# Patient Record
Sex: Female | Born: 1955 | Race: White | Hispanic: No | Marital: Single | Smoking: Former smoker
Health system: Southern US, Community
[De-identification: ages and names within clinical notes are randomized; demographics above are authoritative.]

## PROBLEM LIST (undated history)

## (undated) DIAGNOSIS — I739 Peripheral vascular disease, unspecified: Secondary | ICD-10-CM

## (undated) DIAGNOSIS — R87619 Unspecified abnormal cytological findings in specimens from cervix uteri: Secondary | ICD-10-CM

## (undated) DIAGNOSIS — M1990 Unspecified osteoarthritis, unspecified site: Secondary | ICD-10-CM

## (undated) DIAGNOSIS — J449 Chronic obstructive pulmonary disease, unspecified: Secondary | ICD-10-CM

## (undated) DIAGNOSIS — I70229 Atherosclerosis of native arteries of extremities with rest pain, unspecified extremity: Secondary | ICD-10-CM

## (undated) DIAGNOSIS — I998 Other disorder of circulatory system: Secondary | ICD-10-CM

## (undated) DIAGNOSIS — R519 Headache, unspecified: Secondary | ICD-10-CM

## (undated) HISTORY — PX: COLONOSCOPY W/ POLYPECTOMY: SHX1380

## (undated) HISTORY — DX: Unspecified abnormal cytological findings in specimens from cervix uteri: R87.619

## (undated) HISTORY — PX: BREAST SURGERY: SHX581

## (undated) HISTORY — PX: BUNIONECTOMY: SHX129

## (undated) HISTORY — PX: OTHER SURGICAL HISTORY: SHX169

## (undated) HISTORY — PX: CARDIAC CATHETERIZATION: SHX172

## (undated) HISTORY — DX: Chronic obstructive pulmonary disease, unspecified: J44.9

## (undated) HISTORY — DX: Other disorder of circulatory system: I99.8

## (undated) HISTORY — DX: Atherosclerosis of native arteries of extremities with rest pain, unspecified extremity: I70.229

---

## 2009-09-17 ENCOUNTER — Other Ambulatory Visit: Admission: RE | Admit: 2009-09-17 | Discharge: 2009-09-17 | Payer: Self-pay | Admitting: Rheumatology

## 2010-01-07 DIAGNOSIS — J449 Chronic obstructive pulmonary disease, unspecified: Secondary | ICD-10-CM

## 2010-01-07 DIAGNOSIS — J4489 Other specified chronic obstructive pulmonary disease: Secondary | ICD-10-CM | POA: Insufficient documentation

## 2010-01-08 ENCOUNTER — Ambulatory Visit: Payer: Self-pay | Admitting: Internal Medicine

## 2010-01-08 DIAGNOSIS — F17200 Nicotine dependence, unspecified, uncomplicated: Secondary | ICD-10-CM | POA: Insufficient documentation

## 2010-01-09 ENCOUNTER — Telehealth (INDEPENDENT_AMBULATORY_CARE_PROVIDER_SITE_OTHER): Payer: Self-pay | Admitting: *Deleted

## 2010-02-05 ENCOUNTER — Telehealth (INDEPENDENT_AMBULATORY_CARE_PROVIDER_SITE_OTHER): Payer: Self-pay | Admitting: *Deleted

## 2010-05-05 NOTE — Progress Notes (Signed)
Summary: cxr results  Phone Note Call from Patient Call back at Home Phone [phone]   Caller: Patient Call For: WERT Summary of Call: PT RETURNED CALL FROM MINDY.  Initial call taken by: Tivis Ringer, CNA,  January 09, 2010 12:54 PM  Follow-up for Phone Call        called spoke with patient, advised of cxr results.  pt verbalized her understanding. Follow-up by: Boone Master CNA/MA,  January 09, 2010 1:31 PM

## 2010-05-05 NOTE — Assessment & Plan Note (Signed)
Summary: Pulmonary consultation/ copd exac/ still smoking   Visit Type:  Initial Consult Copy to:  Dr. Kathrynn Running Primary Provider/Referring Provider:  Dr. Kathrynn Running   CC:  COPD.  History of Present Illness: 55 yowf active smoker with recurrent pattern of cough/ congestion since 2000 dx as ab and on some form of maint rx with good control of daily symptoms until Sept 2011  January 08, 2010  1st pulmonary office eval  cc cough x worse since Sept 2011 and since then using duoneb every 4hours including at night rx with doxy/ pred then biaxin and now still on Prednisone 20 mg and now for the first time really not responding >  no purulent mucus.  generalized bilateral  cp with coughing and urinary incontinence but no overt heartburn.  Pt denies any significant sore throat, dysphagia, itching, sneezing,  nasal congestion or excess secretions,  fever, chills, sweats, unintended wt loss, pleuritic or exertional cp, hempoptysis, change in activity tolerance  orthopnea pnd or leg swelling Pt also denies any obvious fluctuation in symptoms with weather or environmental change or other alleviating or aggravating factors.       Preventive Screening-Counseling & Management  Alcohol-Tobacco     Smoking Status: current     Smoking Cessation Counseling: yes  Current Medications (verified): 1)  Symbicort 160-4.5 Mcg/act Aero (Budesonide-Formoterol Fumarate) .... 2 Puffs Two Times A Day 2)  Duoneb 0.5-2.5 (3) Mg/49ml Soln (Ipratropium-Albuterol) .Marland Kitchen.. 1 in Nebulizer Once Daily As Needed 3)  Proair Hfa 108 (90 Base) Mcg/act Aers (Albuterol Sulfate) .... 2 Puffs Every 4-6 Hours As Needed 4)  Prednisone 20 Mg Tabs (Prednisone) .... As Directed Per Dr Kathrynn Running  Allergies (verified): No Known Drug Allergies  Past History:  Past Medical History: COPD/ab, clinical dx only    - PFT's ordered January 08, 2010 >>>  Family History: Family History Emphysema - Mother Family History Lung Cancer- Father (was a  smoker) Heart dz- Mother Family History Colon Cancer- Sister neg atopy  Social History: Single Current smoker since age 55.  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  The patient denies productive cough, coughing up blood, chest pain, irregular heartbeats, acid heartburn, indigestion, loss of appetite, weight change, abdominal pain, difficulty swallowing, sore throat, tooth/dental problems, headaches, nasal congestion/difficulty breathing through nose, sneezing, itching, ear ache, anxiety, depression, hand/feet swelling, joint stiffness or pain, rash, change in color of mucus, and fever.    Vital Signs:  Patient profile:   55 year old female Height:      66 inches Weight:      185.38 pounds BMI:     30.03 O2 Sat:      97 % on Room air Temp:     98.3 degrees F oral Pulse rate:   78 / minute BP sitting:   138 / 78  (left arm)  Vitals Entered By: Vernie Murders (January 08, 2010 11:11 AM)  O2 Flow:  Room air  Physical Exam  Additional Exam:  amb pleasant wf with harsh barking upper airway cough wt 185 January 08, 2010 HEENT mild turbinate edema.  Oropharynx no thrush or excess pnd or cobblestoning.  No JVD or cervical adenopathy. Mild accessory muscle hypertrophy. Trachea midline, nl thryroid. Chest was hyperinflated by percussion with diminished breath sounds and moderate increased exp  time without wheeze. Hoover sign positive at mid inspiration. Regular rate and rhythm without murmur gallop or rub or increase P2 or edema.  Abd: no hsm, nl excursion. Ext warm without cyanosis or clubbing.     Impression & Recommendations:  Problem # 1:  COPD (ICD-496)   DDX of  difficult airways managment all start with A and  include Adherence, Ace Inhibitors, Acid Reflux, Active Sinus Disease, Alpha 1 Antitripsin deficiency,  Anxiety masquerading as Airways dz,  ABPA,  allergy(esp in young), Aspiration (esp in elderly), Adverse effects of DPI,  Active smokers, plus one B  = Beta blocker use.Marland Kitchen    Active smoking likely the main culprit here  ? adherence?  both of her hfa's were empty today I spent extra time with the patient today explaining optimal mdi  technique.  This improved from  50-75%  Medications Added to Medication List This Visit: 1)  Symbicort 160-4.5 Mcg/act Aero (Budesonide-formoterol fumarate) .... 2 puffs two times a day 2)  Symbicort 160-4.5 Mcg/act Aero (Budesonide-formoterol fumarate) .... 2 puffs first thing  in am and 2 puffs again in pm about 12 hours later 3)  Duoneb 0.5-2.5 (3) Mg/57ml Soln (Ipratropium-albuterol) .Marland Kitchen.. 1 neb every 4 hours if needed for breathlessness 4)  Duoneb 0.5-2.5 (3) Mg/60ml Soln (Ipratropium-albuterol) .Marland Kitchen.. 1 in nebulizer once daily as needed 5)  Proair Hfa 108 (90 Base) Mcg/act Aers (Albuterol sulfate) .... 2 puffs every 4-6 hours as needed 6)  Prednisone 20 Mg Tabs (Prednisone) .... As directed per dr Kathrynn Running 7)  Xopenex Hfa 45 Mcg/act Aero (Levalbuterol tartrate) .... 2 puffs every 4 hours if needed for short of breath 8)  Mucinex Dm 30-600 Mg Xr12h-tab (Dextromethorphan-guaifenesin) .Marland Kitchen.. 1-2 every 12 hours if  needed for cough or congestion 9)  Tramadol Hcl 50 Mg Tabs (Tramadol hcl) .... One to two by mouth every 4-6 hours if coughing 10)  Prednisone 10 Mg Tabs (Prednisone) .... 4 each am x 2 days,  3 x 2days, 2x2 days, and 1x2 days  Other Orders: T-2 View CXR ([id number])  Patient Instructions: 1)  continue symbicort 160 2 puffs first thing  in am and 2 puffs again in pm about 12 hours later 2)  if still short of breath Xopenex hfa and back this up with duoneb every 4 hours if needed 3)  if cough, mucinex dm 2 every 12 hours and back this up with tramadol 50 mg 1-2 every 4 hours 4)  Prednisone 10 mg 4 each am x 2 days,  3 x 2days, 2x2 days, and 1x2 days  5)   Prilosec 20 mg Take  one 30-60 min before first meal of the day and pepcid 20 mg one at bedtime when coughing 6)  GERD (REFLUX)  is a common cause of respiratory symptoms. It commonly presents without heartburn and can be treated with medication, but also with lifestyle changes including avoidance of late meals, excessive alcohol, smoking cessation, and avoid fatty foods, chocolate, peppermint, colas, red wine, and acidic juices such as orange juice. NO MINT OR MENTHOL PRODUCTS SO NO COUGH DROPS  7)  USE SUGARLESS CANDY INSTEAD (jolley ranchers)  8)  NO OIL BASED VITAMINS  9)  Please schedule a follow-up appointment in 4 weeks, sooner if needed with PFT's on return Prescriptions: TRAMADOL HCL 50 MG  TABS (TRAMADOL HCL) One to two by mouth every 4-6 hours if coughing  #40 x 0   Entered and Authorized by:   Nyoka Cowden MD  Signed by:   Nyoka Cowden MD on 01/08/2010   Method used:   Electronically to        [facility] (retail)       [address]       [place], [place]  [address]       Ph: [phone]       Fax: [phone]   RxID:   [id number] PREDNISONE 10 MG  TABS (PREDNISONE) 4 each am x 2 days,  3 x 2days, 2x2 days, and 1x2 days  #20 x 0   Entered and Authorized by:   Nyoka Cowden MD   Signed by:   Nyoka Cowden MD on 01/08/2010   Method used:   Electronically to        [facility] (retail)       [address]       [place], [place]  [address]       Ph: [phone]       Fax: [phone]   RxID:   [id number]   Appended Document: Pulmonary consultation/ copd exac/ still smoking     Copy to:  Dr. Kathrynn Running Primary Provider/Referring Provider:  Dr. Kathrynn Running    History of Present Illness: 75 yowf active smoker with recurrent pattern of cough/ congestion since 2000 dx as ab and on some form of maint rx with good control of daily symptoms until Sept 2011  January 08, 2010  1st pulmonary office eval   cc cough x worse since Sept 2011 and since then using duoneb every 4hours including at night rx with doxy/ pred then biaxin and now still on Prednisone 20 mg and now for the first time really not responding >  no purulent mucus.  generalized bilateral  cp with coughing and urinary incontinence but no overt heartburn.  Pt denies any significant sore throat, dysphagia, itching, sneezing,  nasal congestion or excess secretions,  fever, chills, sweats, unintended wt loss, pleuritic or exertional cp, hempoptysis, change in activity tolerance  orthopnea pnd or leg swelling Pt also denies any obvious fluctuation in symptoms with weather or environmental change or other alleviating or aggravating factors.       Preventive Screening-Counseling & Management  Alcohol-Tobacco     Smoking Status: current     Smoking Cessation Counseling: yes  Allergies: No Known Drug Allergies  Past History:  Past Medical History: COPD/ab, clinical dx only    - HFA 75% p coaching January 08, 2010     - PFT's ordered January 08, 2010 >>>  Family History: Reviewed history from 01/08/2010 and no changes required. Family History Emphysema - Mother Family History Lung Cancer- Father (was a smoker) Heart dz- Mother Family History Colon Cancer- Sister neg atopy  Social History: Reviewed history from 01/08/2010 and no changes required. Single Current smoker since age 39.  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  Oropharynx no thrush or excess pnd or cobblestoning.  No JVD or cervical adenopathy. Mild accessory muscle hypertrophy. Trachea midline, nl thryroid. Chest was hyperinflated by percussion with diminished breath sounds and moderate increased exp time without wheeze. Hoover sign positive at mid inspiration. Regular rate and rhythm without murmur gallop or rub or increase  P2  or edema.  Abd: no hsm, nl excursion. Ext warm without cyanosis or clubbing.     CXR  Procedure date:  01/08/2010  Findings:        Comparison: None.   Findings: The lungs are clear.  There is no pneumothorax or pleural effusion.  Heart size is normal.   IMPRESSION: No acute disease.  Impression & Recommendations:  Problem # 1:  COPD (ICD-496)    DDX of  difficult airways managment all start with A and  include Adherence, Ace Inhibitors, Acid Reflux, Active Sinus Disease, Alpha 1 Antitripsin deficiency, Anxiety masquerading as Airways dz,  ABPA,  allergy(esp in young), Aspiration (esp in elderly), Adverse effects of DPI,  Active smokers, plus one B  = Beta blocker use.Marland Kitchen    Active smoking likely the main culprit here  ? adherence?  both of her hfa's were empty today I spent extra time with the patient today explaining optimal mdi  technique.  This improved from  50-75%  ? acid reflux:  probably an issue given severity of cough.  See instructions for specific recommendations   ? active sinus dz > sinus ct next step  Problem # 2:  SMOKER (ICD-305.1)   I emphasized that although we never turn away smokers from the pulmonary clinic, we do ask that they understand that the recommendations that were made won't work nearly as well in the presence of continued cigarette exposure and we may reach a point where we can't help the patient if he/she can't quit smoking.    I took this opportunity to educate the patient regarding the consequences of smoking in airway disorders based on all the data we have from the multiple national lung health studies indicating that smoking cessation, not choice of inhalers or physicians, is the most important aspect of care.    Orders: Consultation Level V [phone])  Patient Instructions: 1)  continue symbicort 160 2 puffs first thing  in am and 2 puffs again in pm about 12 hours later 2)  if still short of breath Xopenex hfa and back this up with  duoneb every 4 hours if needed 3)  if cough, mucinex dm 2 every 12 hours and back this up with tramadol 50 mg 1-2 every 4 hours 4)  Prednisone 10 mg 4 each am x 2 days,  3 x 2days, 2x2 days, and 1x2 days  5)  Prilosec 20 mg Take  one 30-60 min before first meal of the day and pepcid 20 mg one at bedtime when coughing 6)  GERD (REFLUX)  is a common cause of respiratory symptoms. It commonly presents without heartburn and can be treated with medication, but also with lifestyle changes including avoidance of late meals, excessive alcohol, smoking cessation, and avoid fatty foods, chocolate, peppermint, colas, red wine, and acidic juices such as orange juice. NO MINT OR MENTHOL PRODUCTS SO NO COUGH DROPS  7)  USE SUGARLESS CANDY INSTEAD (jolley ranchers)  8)  NO OIL BASED VITAMINS  9)  Please schedule a follow-up appointment in 4 weeks, sooner if needed with PFT's on return

## 2010-05-05 NOTE — Progress Notes (Signed)
Summary: refill - returning call  Phone Note Call from Patient Call back at Home Phone [phone]   Caller: Patient Call For: wert Summary of Call: pt needs refill of xopenex inhaler called to [facility] had a sample before Initial call taken by: Lacinda Axon,  February 05, 2010 3:25 PM  Follow-up for Phone Call        [facility]CB x1 to verify med requested and get pharmacy. Boone Master CNA/MA  February 05, 2010 3:34 PM    Returning call. Lehman Prom  February 05, 2010 4:24 PM  Additional Follow-up for Phone Call Additional follow up Details #1::        Spoke with pt and verified msg, rx for xopenex was sent to [facility]. Vernie Murders  February 05, 2010 4:33 PM     Prescriptions: XOPENEX HFA 45 MCG/ACT AERO (LEVALBUTEROL TARTRATE) 2 puffs every 4 hours if needed for short of breath  #1 x 3   Entered by:   Vernie Murders   Authorized by:   Nyoka Cowden MD   Signed by:   Vernie Murders on 02/05/2010   Method used:   Electronically to        [facility] (retail)       [address]       [place], [place]  [address]       Ph: [phone]       Fax: [phone]   RxID:   [id number]

## 2010-11-23 ENCOUNTER — Ambulatory Visit (HOSPITAL_BASED_OUTPATIENT_CLINIC_OR_DEPARTMENT_OTHER)
Admission: RE | Admit: 2010-11-23 | Discharge: 2010-11-23 | Disposition: A | Discharge status: Home / Self Care | Payer: Managed Care, Other (non HMO) | Source: Ambulatory Visit | Attending: Family Medicine | Admitting: Family Medicine

## 2010-11-23 ENCOUNTER — Encounter: Payer: Self-pay | Admitting: Family Medicine

## 2010-11-23 ENCOUNTER — Ambulatory Visit (INDEPENDENT_AMBULATORY_CARE_PROVIDER_SITE_OTHER): Payer: Self-pay | Admitting: Family Medicine

## 2010-11-23 VITALS — BP 142/82 | HR 80 | Temp 98.0°F | Ht 66.0 in | Wt 186.0 lb

## 2010-11-23 DIAGNOSIS — M949 Disorder of cartilage, unspecified: Secondary | ICD-10-CM | POA: Insufficient documentation

## 2010-11-23 DIAGNOSIS — M899 Disorder of bone, unspecified: Secondary | ICD-10-CM | POA: Insufficient documentation

## 2010-11-23 DIAGNOSIS — M25569 Pain in unspecified knee: Secondary | ICD-10-CM

## 2010-11-23 DIAGNOSIS — M25561 Pain in right knee: Secondary | ICD-10-CM | POA: Insufficient documentation

## 2010-11-23 DIAGNOSIS — M898X9 Other specified disorders of bone, unspecified site: Secondary | ICD-10-CM

## 2010-11-23 NOTE — Patient Instructions (Signed)
Your symptoms and exam are consistent with mild arthritis versus a degenerative cartilage tear in your knee. These are both treated the same initially. You were given a cortisone shot to calm down the inflammation, pain, and swelling. Aggressive quad strengthening to help unload the joint (straight leg raises, quad sets - 3 sets of 10 once a day) - if too easy, add an ankle weight. Consider formal physical therapy. Icing 15 minutes at a time 3-4 times a day. ACE wrap and elevation if swelling becomes more severe. Knee sleeve would help with swelling as well. Try the orthotics you just bought and I would recommend you buy shoes with a wide forefoot. Follow up with me in 1 month if the orthotics you bought are not helping.

## 2010-11-23 NOTE — Progress Notes (Signed)
  Subjective:    Patient ID: Angela Alvarez, female    DOB: 03/08/1956, 55 y.o.   MRN: [id number]  PCP: Dr. Juline Patch  HPI 55 yo F here with right knee pain  Patient denies known injury. States for past 11 months has had intermittent lateral > medial knee pain. Associated with some swelling though not swollen now. No bruising. No catching, locking, giving out. Takes advil occasionally. Not icing or using heat. Pain worse when on feet for long periods of time. Has not been evaluated for this before or had x-rays.  She's also had problems with bilateral bunions, metatarsalgia - just ordered some temporary air orthotics that she's going to try - discussed wide forefoot shoes as well.  Past Medical History  Diagnosis Date  . Asthma     No current outpatient prescriptions on file prior to visit.    Past Surgical History  Procedure Date  . Bunionectomy     bilateral    No Known Allergies  History   Social History  . Marital Status: Single    Spouse Name: N/A    Number of Children: N/A  . Years of Education: N/A   Occupational History  . Not on file.   Social History Main Topics  . Smoking status: Current Everyday Smoker -- 1.0 packs/day    Types: Cigarettes  . Smokeless tobacco: Not on file  . Alcohol Use: Not on file  . Drug Use: Not on file  . Sexually Active: Not on file   Other Topics Concern  . Not on file   Social History Narrative  . No narrative on file    Family History  Problem Relation Age of Onset  . Diabetes Neg Hx   . Heart attack Neg Hx   . Hyperlipidemia Neg Hx   . Hypertension Neg Hx   . Sudden death Neg Hx     BP 142/82  Pulse 80  Temp(Src) 98 F (36.7 C) (Oral)  Ht 5\' 6"  (1.676 m)  Wt 186 lb (84.369 kg)  BMI 30.02 kg/m2  Review of Systems See HPI above.    Objective:   Physical Exam Gen: NAD R knee: No gross deformity, ecchymoses, swelling.  No TTP joint lines, pes, post patellar facets, elsewhere about knee  (states not having any pain currently) FROM. Negative ant/post drawers. Negative valgus/varus testing. Negative lachmanns. Negative mcmurrays, apleys, patellar apprehension, clarkes. NV intact distally.    Assessment & Plan:  1. Right knee pain - radiographs reviewed and only mild DJD but more medial.  History is most consistent with pain from mild DJD vs degenerative meniscal tear, both treated similarly.  Offered injection vs oral nsaid and she would like to proceed with injection.  Shown quad strengthening exercises (she prefers this over formal PT).  Icing prn.  See instructions for further.  After informed written consent, patient was seated on exam table. Right knee was prepped with alcohol swab and utilizing anteromedial approach, patient's right knee was injected intraarticularly with 3:1 marcaine: depomedrol. Patient tolerated the procedure well without immediate complications.

## 2010-11-23 NOTE — Assessment & Plan Note (Signed)
radiographs reviewed and only mild DJD but more medial.  History is most consistent with pain from mild DJD vs degenerative meniscal tear, both treated similarly.  Offered injection vs oral nsaid and she would like to proceed with injection.  Shown quad strengthening exercises (she prefers this over formal PT).  Icing prn.  See instructions for further.  After informed written consent, patient was seated on exam table. Right knee was prepped with alcohol swab and utilizing anteromedial approach, patient's right knee was injected intraarticularly with 3:1 marcaine: depomedrol. Patient tolerated the procedure well without immediate complications.

## 2010-12-04 ENCOUNTER — Ambulatory Visit (INDEPENDENT_AMBULATORY_CARE_PROVIDER_SITE_OTHER): Payer: Managed Care, Other (non HMO) | Admitting: Family Medicine

## 2010-12-04 ENCOUNTER — Encounter: Payer: Self-pay | Admitting: Family Medicine

## 2010-12-04 DIAGNOSIS — M7740 Metatarsalgia, unspecified foot: Secondary | ICD-10-CM | POA: Insufficient documentation

## 2010-12-04 DIAGNOSIS — M79609 Pain in unspecified limb: Secondary | ICD-10-CM

## 2010-12-04 DIAGNOSIS — M79673 Pain in unspecified foot: Secondary | ICD-10-CM

## 2010-12-04 DIAGNOSIS — M7750 Other enthesopathy of unspecified foot: Secondary | ICD-10-CM

## 2010-12-04 NOTE — Progress Notes (Signed)
  Subjective:    Patient ID: Angela Alvarez, female    DOB: Nov 12, 1955, 55 y.o.   MRN: [id number]  HPI  Patient returned today for sports insoles - she tried the air orthotics she purchased and these were not helping her.  She was advised she could just come in and get these but she made an appointment.  See previous OV note for details.  Past Medical History  Diagnosis Date  . Asthma     Current Outpatient Prescriptions on File Prior to Visit  Medication Sig Dispense Refill  . albuterol (PROVENTIL,VENTOLIN) 90 MCG/ACT inhaler Inhale 2 puffs into the lungs every 4 (four) hours as needed.        . budesonide-formoterol (SYMBICORT) 160-4.5 MCG/ACT inhaler Inhale 2 puffs into the lungs 2 (two) times daily.          Past Surgical History  Procedure Date  . Bunionectomy     bilateral    No Known Allergies  History   Social History  . Marital Status: Single    Spouse Name: N/A    Number of Children: N/A  . Years of Education: N/A   Occupational History  . Not on file.   Social History Main Topics  . Smoking status: Current Everyday Smoker -- 1.0 packs/day    Types: Cigarettes  . Smokeless tobacco: Not on file  . Alcohol Use: Not on file  . Drug Use: Not on file  . Sexually Active: Not on file   Other Topics Concern  . Not on file   Social History Narrative  . No narrative on file    Family History  Problem Relation Age of Onset  . Diabetes Neg Hx   . Heart attack Neg Hx   . Hyperlipidemia Neg Hx   . Hypertension Neg Hx   . Sudden death Neg Hx     BP 147/92  Pulse 74  Temp(Src) 97.6 F (36.4 C) (Oral)  Ht 5\' 6"  (1.676 m)  Wt 182 lb (82.555 kg)  BMI 29.38 kg/m2  Review of Systems     Objective:   Physical Exam See last OV.    Assessment & Plan:  Sports insoles given today with MT pads and small scaphoid pads.

## 2010-12-04 NOTE — Assessment & Plan Note (Signed)
Callus formation under bilateral 1st and 5th MTs - large one under left 3rd MT.  Pain in these areas - given sports insoles with MT pads, scaphoid pads.

## 2010-12-04 NOTE — Assessment & Plan Note (Signed)
Overpronation, Callus formation under bilateral 1st and 5th MTs - large one under left 3rd MT.  Pain in these areas - given sports insoles with MT pads, scaphoid pads.

## 2010-12-08 ENCOUNTER — Ambulatory Visit: Payer: Managed Care, Other (non HMO) | Admitting: Family Medicine

## 2012-05-09 ENCOUNTER — Other Ambulatory Visit: Payer: Self-pay | Admitting: Internal Medicine

## 2012-05-09 DIAGNOSIS — F17200 Nicotine dependence, unspecified, uncomplicated: Secondary | ICD-10-CM

## 2013-04-11 ENCOUNTER — Encounter: Payer: Self-pay | Admitting: Obstetrics and Gynecology

## 2013-04-11 ENCOUNTER — Ambulatory Visit (INDEPENDENT_AMBULATORY_CARE_PROVIDER_SITE_OTHER): Payer: Managed Care, Other (non HMO) | Admitting: Obstetrics and Gynecology

## 2013-04-11 VITALS — BP 142/82 | HR 71 | Resp 16 | Ht 65.25 in | Wt 172.0 lb

## 2013-04-11 DIAGNOSIS — N762 Acute vulvitis: Secondary | ICD-10-CM

## 2013-04-11 DIAGNOSIS — Z1239 Encounter for other screening for malignant neoplasm of breast: Secondary | ICD-10-CM

## 2013-04-11 DIAGNOSIS — N760 Acute vaginitis: Secondary | ICD-10-CM

## 2013-04-11 MED ORDER — LIDOCAINE HCL 2 % EX GEL: 1.0000 "application " | Freq: Three times a day (TID) | CUTANEOUS | Status: DC

## 2013-04-11 NOTE — Progress Notes (Signed)
Patient ID: Angela Alvarez, female   DOB: 08-06-1955, 58 y.o.   MRN: [id number] GYNECOLOGY PROBLEM VISIT  PCP:   Dr. Ricki Miller  Referring provider:   HPI: 58 y.o.   Single  Caucasian  female   G1P0010 with Patient's last menstrual period was 04/05/2004.   here for   Vulvar and rectal itching for 3 months.  Saw NP at Dr. Lynne Logan office. Lidocine jelly is helping.  Itching on the vulva started and has spread to the clitoris and the anus. Tried Monistat and Diflucan and they did not help. No ulceration unless scratches. Tried 1 % hydrocortisone cream which helped. Also tried Emu Oil.  No history of derm pathology.   No new exposures.   Newly sexually active April 06, 2013, for the first time in four years.   Does not do regular GYN annual exams.   Trying to stop smoking. Has COPD. No oxygen use.   GYNECOLOGIC HISTORY: Patient's last menstrual period was 04/05/2004. Sexually active: no  Partner preference: female Contraception:   postmenopausal Menopausal hormone therapy: none DES exposure:   no Blood transfusions: no Sexually transmitted diseases:   no GYN Procedures:  no Mammogram:    2003 wnl !!        Pap:   2012 wnl: with PCP History of abnormal pap smear:  30 years ago and had cryotherapy to cervix.  Paps normal since.   OB History   Grav Para Term Preterm Abortions TAB SAB Ect Mult Living   1    1  1             Family History  Problem Relation Age of Onset  . Diabetes Neg Hx   . Heart attack Neg Hx   . Hyperlipidemia Neg Hx   . Sudden death Neg Hx   . Stroke Mother     dec age 55  . Hypertension Mother   . Cancer Father     lung age 57  . Cancer Sister     dec colon ca age 75  . Hypertension Sister     Patient Active Problem List   Diagnosis Date Noted  . Foot pain 12/04/2010  . Metatarsalgia 12/04/2010  . Right knee pain 11/23/2010  . SMOKER 01/08/2010  . COPD 01/07/2010    Past Medical History  Diagnosis Date  . Asthma   . COPD (chronic  obstructive pulmonary disease)     Past Surgical History  Procedure Laterality Date  . Bunionectomy      bilateral  . Breast surgery    . Surgery under arm Right     --benign breast tissue    ALLERGIES: Review of patient's allergies indicates no known allergies.  Current Outpatient Prescriptions  Medication Sig Dispense Refill  . albuterol (PROVENTIL,VENTOLIN) 90 MCG/ACT inhaler Inhale 2 puffs into the lungs every 4 (four) hours as needed.        . budesonide-formoterol (SYMBICORT) 160-4.5 MCG/ACT inhaler Inhale 2 puffs into the lungs 2 (two) times daily.        Marland Kitchen ipratropium-albuterol (DUONEB) 0.5-2.5 (3) MG/3ML SOLN Take 1 mL by nebulization as needed.      . lidocaine (XYLOCAINE) 5 % ointment Apply 1 application topically as needed.       No current facility-administered medications for this visit.     ROS:  Pertinent items are noted in HPI.  SOCIAL HISTORY:  Customer service rep.  PHYSICAL EXAMINATION:    BP 142/82  Pulse 71  Resp 16  Ht 5'  5.25" (1.657 m)  Wt 172 lb (78.019 kg)  BMI 28.42 kg/m2  LMP 04/05/2004   Wt Readings from Last 3 Encounters:  04/11/13 172 lb (78.019 kg)  12/04/10 182 lb (82.555 kg)  11/23/10 186 lb (84.369 kg)     Ht Readings from Last 3 Encounters:  04/11/13 5' 5.25" (1.657 m)  12/04/10 5\' 6"  (1.676 m)  11/23/10 5\' 6"  (1.676 m)    General appearance: alert, cooperative and appears stated age Abdomen: soft, non-tender; no masses,  no organomegaly.  Umbilicus with slight erythema.  Extremities: extremities normal, atraumatic, no cyanosis or edema.  Multiple varicose veins. No abnormal inguinal nodes palpated Neurologic: Grossly normal  Pelvic: External genitalia:  Vulva with skiny white change to the epithelium.  Split in skin of perineum.  Some loss of architecture.  Perianal white epithelium              Urethra:  normal appearing urethra with no masses, tenderness or lesions              Bartholins and Skenes: normal                  Vagina: normal appearing vagina with normal color and discharge, no lesions              Cervix: normal appearance                    Bimanual Exam:  Uterus:  uterus is normal size, shape, consistency and nontender                                      Adnexa: normal adnexa in size, nontender and no masses                                        ASSESSMENT  Vulvitis.  I suspect lichen sclerosis. Overdue for well woman visit.  PLAN  Counseled on potential lichen sclerosis and treatment thereof. Will return for a vulvar biopsy and perianal biopsy. Xylocaine jellly 2% to area tid prn. [facility]. Patient will have annual examination after vulvar biopsy is completed. Medications per Epic orders   An After Visit Summary was printed and given to the patient.  30 minutes face to face time of which over 50% was spent in counseling.

## 2013-04-11 NOTE — Progress Notes (Signed)
Screening MMG [facility] for 05-01-13 [facility]  Appointment card with map given. Patient agreeable to date and time.

## 2013-04-11 NOTE — Patient Instructions (Signed)
We will have you return for a biopsy to determine the source of the itching and irritation.

## 2013-04-16 ENCOUNTER — Encounter: Payer: Self-pay | Admitting: Obstetrics and Gynecology

## 2013-04-16 ENCOUNTER — Telehealth: Payer: Self-pay | Admitting: Obstetrics and Gynecology

## 2013-04-16 ENCOUNTER — Ambulatory Visit (INDEPENDENT_AMBULATORY_CARE_PROVIDER_SITE_OTHER): Payer: Managed Care, Other (non HMO) | Admitting: Obstetrics and Gynecology

## 2013-04-16 VITALS — BP 140/78 | HR 88 | Resp 16 | Wt 171.0 lb

## 2013-04-16 DIAGNOSIS — N760 Acute vaginitis: Secondary | ICD-10-CM

## 2013-04-16 DIAGNOSIS — N762 Acute vulvitis: Secondary | ICD-10-CM

## 2013-04-16 MED ORDER — LIDOCAINE 5 % EX OINT: 1.0000 "application " | TOPICAL_OINTMENT | Freq: Three times a day (TID) | CUTANEOUS | Status: DC | PRN

## 2013-04-16 NOTE — Progress Notes (Signed)
GYNECOLOGY PROBLEM VISIT  PCP:  Juline Patch, MD  Referring provider:   HPI: 58 y.o.   Single  Caucasian  female   G1P0010 with Patient's last menstrual period was 04/05/2004.   here for   Vulvar Bx. Scratching a lot and pain of the vulva. Xylocaine gel not working well.   GYNECOLOGIC HISTORY: Patient's last menstrual period was 04/05/2004. Sexually active: no  Partner preference: female Contraception:   menopausal Menopausal hormone therapy: no DES exposure: no   Blood transfusions:  no  Sexually transmitted diseases:  no  GYN Procedures:  Cryo Mammogram:   05/01/13              Pap:   2012 neg History of abnormal pap smear:  Many years ago   OB History   Grav Para Term Preterm Abortions TAB SAB Ect Mult Living   1    1  1             Family History  Problem Relation Age of Onset  . Diabetes Neg Hx   . Heart attack Neg Hx   . Hyperlipidemia Neg Hx   . Sudden death Neg Hx   . Stroke Mother     dec age 49  . Hypertension Mother   . Cancer Father     lung age 44  . Cancer Sister     dec colon ca age 50  . Hypertension Sister     Patient Active Problem List   Diagnosis Date Noted  . Foot pain 12/04/2010  . Metatarsalgia 12/04/2010  . Right knee pain 11/23/2010  . SMOKER 01/08/2010  . COPD 01/07/2010    Past Medical History  Diagnosis Date  . Asthma   . COPD (chronic obstructive pulmonary disease)     Past Surgical History  Procedure Laterality Date  . Bunionectomy      bilateral  . Breast surgery    . Surgery under arm Right     --benign breast tissue    ALLERGIES: Review of patient's allergies indicates no known allergies.  Current Outpatient Prescriptions  Medication Sig Dispense Refill  . albuterol (PROVENTIL,VENTOLIN) 90 MCG/ACT inhaler Inhale 2 puffs into the lungs every 4 (four) hours as needed.        . budesonide-formoterol (SYMBICORT) 160-4.5 MCG/ACT inhaler Inhale 2 puffs into the lungs 2 (two) times daily.        Marland Kitchen  ipratropium-albuterol (DUONEB) 0.5-2.5 (3) MG/3ML SOLN Take 1 mL by nebulization as needed.      . lidocaine (XYLOCAINE JELLY) 2 % jelly Apply 1 application topically 3 (three) times daily.  30 mL  0  . lidocaine (XYLOCAINE) 5 % ointment Apply 1 application topically as needed.       No current facility-administered medications for this visit.     ROS:  Pertinent items are noted in HPI.  SOCIAL HISTORY:    PHYSICAL EXAMINATION:    BP 140/78  Pulse 88  Resp 16  Wt 171 lb (77.565 kg)  LMP 04/05/2004   Wt Readings from Last 3 Encounters:  04/16/13 171 lb (77.565 kg)  04/11/13 172 lb (78.019 kg)  12/04/10 182 lb (82.555 kg)     Ht Readings from Last 3 Encounters:  04/11/13 5' 5.25" (1.657 m)  12/04/10 5\' 6"  (1.676 m)  11/23/10 5\' 6"  (1.676 m)   PROCEDURE - vulvar biopsy  Consent for procedure. Whitish changes to periclitoral region, erythema and excoriations to perianal region.  Ulcer of left labia minora.  Fissure of  left labia majora inferiorly.  Sterile prep of vulva and perianal region with betadine. Local lidocaine 1% with sodium bicarbonate 8.4 % to right labia majora at base, right perianal region at 8 o'clock, and right labia minor. Biopsy with 4 mm punch to right labia and right perianal region.   Unable to do biopsy to left labia due to discomfort. AgNO3 placed to biopsy areas and then bandaids. No complications.  Minimal EBL.  Tissue specimens to pathology - GPA, separately.  ASSESSMENT  Vulvitis.  Probable lichen sclerosus.    PLAN Follow up vulvar and perianal biopsy. I anticipate prescription for Clobetasol ointment. New Rx for Lidocaine ointment 5% to are tid prn. Benadryl 25 - 50 mg at hs prn itching and insomnia.  Return in omne month for a recheck and annual examination. Keep appointment for mammogram.    An After Visit Summary was printed and given to the patient.

## 2013-04-16 NOTE — Patient Instructions (Signed)

## 2013-04-16 NOTE — Telephone Encounter (Signed)
Left message for patient to call back to discuss insurance information and to schedule procedure.//ssf

## 2013-04-19 ENCOUNTER — Telehealth: Payer: Self-pay | Admitting: Obstetrics and Gynecology

## 2013-04-19 MED ORDER — CLOBETASOL PROPIONATE 0.05 % EX OINT: TOPICAL_OINTMENT | CUTANEOUS | Status: DC

## 2013-04-19 NOTE — Telephone Encounter (Signed)
Patient calling for results of biopsy. She will start treatment with Temovate ointment for lichen sclerosus. Directions given. Follow up in one mont.

## 2013-04-19 NOTE — Telephone Encounter (Signed)
Phone call to patient to discuss biopsy results. Biopsy showed lichen sclerosus e atrophicus. No dysplasia or atypia seen.  I will prescribe temovate ointment 0.05% to place in a very thin layer over the affected area bid. If symptoms improve, reduce the usage to once a day.   Patient will need to see me back in for her annual examination in one month for a recheck.

## 2013-05-01 ENCOUNTER — Ambulatory Visit: Payer: Managed Care, Other (non HMO)

## 2013-05-10 ENCOUNTER — Ambulatory Visit
Admission: RE | Admit: 2013-05-10 | Discharge: 2013-05-10 | Disposition: A | Discharge status: Home / Self Care | Payer: Managed Care, Other (non HMO) | Source: Ambulatory Visit | Attending: Obstetrics and Gynecology | Admitting: Obstetrics and Gynecology

## 2013-05-10 DIAGNOSIS — Z1239 Encounter for other screening for malignant neoplasm of breast: Secondary | ICD-10-CM

## 2013-05-16 ENCOUNTER — Telehealth: Payer: Self-pay | Admitting: Obstetrics and Gynecology

## 2013-05-16 NOTE — Telephone Encounter (Signed)
Patient has appt scheduled for Friday for aex/4 week follow up appt. She is unable to come in that day needs to come next week. i dont have anything i can put her in until 3rd week of march. Can you find anything?

## 2013-05-16 NOTE — Telephone Encounter (Signed)
Spoke with pt about rescheduling Aex and recheck. Pt agreed to 06-01-13 at 3:30 with BS.

## 2013-05-18 ENCOUNTER — Ambulatory Visit: Payer: Managed Care, Other (non HMO) | Admitting: Obstetrics and Gynecology

## 2013-05-18 ENCOUNTER — Other Ambulatory Visit: Payer: Self-pay | Admitting: Obstetrics and Gynecology

## 2013-05-18 DIAGNOSIS — R928 Other abnormal and inconclusive findings on diagnostic imaging of breast: Secondary | ICD-10-CM

## 2013-05-30 ENCOUNTER — Ambulatory Visit
Admission: RE | Admit: 2013-05-30 | Discharge: 2013-05-30 | Disposition: A | Discharge status: Home / Self Care | Payer: Managed Care, Other (non HMO) | Source: Ambulatory Visit | Attending: Obstetrics and Gynecology | Admitting: Obstetrics and Gynecology

## 2013-05-30 DIAGNOSIS — R928 Other abnormal and inconclusive findings on diagnostic imaging of breast: Secondary | ICD-10-CM

## 2013-06-01 ENCOUNTER — Ambulatory Visit (INDEPENDENT_AMBULATORY_CARE_PROVIDER_SITE_OTHER): Payer: Managed Care, Other (non HMO) | Admitting: Obstetrics and Gynecology

## 2013-06-01 ENCOUNTER — Encounter: Payer: Self-pay | Admitting: Obstetrics and Gynecology

## 2013-06-01 VITALS — BP 138/80 | HR 88 | Resp 20 | Ht 65.0 in | Wt 172.0 lb

## 2013-06-01 DIAGNOSIS — Z0000 Encounter for general adult medical examination without abnormal findings: Secondary | ICD-10-CM

## 2013-06-01 DIAGNOSIS — N951 Menopausal and female climacteric states: Secondary | ICD-10-CM

## 2013-06-01 DIAGNOSIS — R87619 Unspecified abnormal cytological findings in specimens from cervix uteri: Secondary | ICD-10-CM

## 2013-06-01 DIAGNOSIS — Z01419 Encounter for gynecological examination (general) (routine) without abnormal findings: Secondary | ICD-10-CM

## 2013-06-01 DIAGNOSIS — Z780 Asymptomatic menopausal state: Secondary | ICD-10-CM

## 2013-06-01 HISTORY — DX: Unspecified abnormal cytological findings in specimens from cervix uteri: R87.619

## 2013-06-01 LAB — POCT URINALYSIS DIPSTICK
Bilirubin, UA: NEGATIVE
Blood, UA: NEGATIVE
Glucose, UA: NEGATIVE
Ketones, UA: NEGATIVE
LEUKOCYTES UA: NEGATIVE
NITRITE UA: NEGATIVE
PROTEIN UA: NEGATIVE
UROBILINOGEN UA: NEGATIVE
pH, UA: 5

## 2013-06-01 MED ORDER — CLOBETASOL PROPIONATE 0.05 % EX OINT: TOPICAL_OINTMENT | CUTANEOUS | Status: DC

## 2013-06-01 NOTE — Patient Instructions (Signed)

## 2013-06-01 NOTE — Progress Notes (Signed)
Patient ID: Reino KentCatherine Tumminello, female   DOB: Apr 14, 1955, 58 y.o.   MRN: [id number] GYNECOLOGY VISIT  PCP:   Juline Patchichard Pang, MD  Referring provider:   HPI: 58 y.o.   Single  Caucasian  female   G1P0010 with Patient's last menstrual period was 04/05/2004.   here for  AEX.   New diagnosis of lichen sclerosus of the vulva.  Using clobetasole ointment once daily.  Symptoms of vulvar itching now resolved.  Has new onset umbilical itching and redness. Used antibacterial ointment.   New diagnosis of bilateral cataracts.  Will have surgery.  Hgb:    PCP Urine:  Neg  GYNECOLOGIC HISTORY: Patient's last menstrual period was 04/05/2004. Sexually active:  no Partner preference: female Contraception:   postmenopausal Menopausal hormone therapy: no DES exposure:   no Blood transfusions:   no Sexually transmitted diseases:    GYN procedures and prior surgeries:  no Last mammogram: 05-30-13 has suspicious area in breast but ultrasound was [facility].                Last pap and high risk HPV testing:  2012 WUJ:WJXBJYwnl:unsure of HPV testing.  History of abnormal pap smear:  Hx of abnormal pap 30 years ago--had colposcopy and cryotherapy to cervix.  Paps have been normal since.   OB History   Grav Para Term Preterm Abortions TAB SAB Ect Mult Living   1    1  1           LIFESTYLE: Exercise:   no            Tobacco:    Smokes 1ppd Alcohol:       no Drug use:    no  OTHER HEALTH MAINTENANCE: Tetanus/TDap:   Up to date with Dr. Deri FuellingPang Gardisil:               n/a Influenza:             04/2013 Zostavax:              n/a  Bone density:      n/a Colonoscopy:      2014 with [facility] and had polyps.  Next colonoscopy due 2019.  Cholesterol check:   04/2013 with PCP:wnl  Family History  Problem Relation Age of Onset  . Diabetes Neg Hx   . Heart attack Neg Hx   . Hyperlipidemia Neg Hx   . Sudden death Neg Hx   . Stroke Mother     dec age 10158  . Hypertension Mother   . Cancer Father      lung age 59  . Cancer Sister     dec colon ca age 59  . Hypertension Sister     Patient Active Problem List   Diagnosis Date Noted  . Foot pain 12/04/2010  . Metatarsalgia 12/04/2010  . Right knee pain 11/23/2010  . SMOKER 01/08/2010  . COPD 01/07/2010   Past Medical History  Diagnosis Date  . Asthma   . COPD (chronic obstructive pulmonary disease)     Past Surgical History  Procedure Laterality Date  . Bunionectomy      bilateral  . Breast surgery    . Surgery under arm Right     --benign breast tissue    ALLERGIES: Review of patient's allergies indicates no known allergies.  Current Outpatient Prescriptions  Medication Sig Dispense Refill  . albuterol (PROVENTIL,VENTOLIN) 90 MCG/ACT inhaler Inhale 2 puffs into the lungs every 4 (four) hours as needed.        .Marland Kitchen  budesonide-formoterol (SYMBICORT) 160-4.5 MCG/ACT inhaler Inhale 2 puffs into the lungs 2 (two) times daily.        . clobetasol ointment (TEMOVATE) 0.05 % Apply thin layer of ointment over affected area twice a day.  60 g  0  . ipratropium-albuterol (DUONEB) 0.5-2.5 (3) MG/3ML SOLN Take 1 mL by nebulization as needed.      . lidocaine (XYLOCAINE) 5 % ointment Apply 1 application topically 3 (three) times daily as needed.  35.44 g  0   No current facility-administered medications for this visit.     ROS:  Pertinent items are noted in HPI.  SOCIAL HISTORY:    PHYSICAL EXAMINATION:    BP 138/80  Pulse 88  Resp 20  Ht 5\' 5"  (1.651 m)  Wt 172 lb (78.019 kg)  BMI 28.62 kg/m2  LMP 04/05/2004   Wt Readings from Last 3 Encounters:  06/01/13 172 lb (78.019 kg)  04/16/13 171 lb (77.565 kg)  04/11/13 172 lb (78.019 kg)     Ht Readings from Last 3 Encounters:  06/01/13 5\' 5"  (1.651 m)  04/11/13 5' 5.25" (1.657 m)  12/04/10 5\' 6"  (1.676 m)    General appearance: alert, cooperative and appears stated age Head: Normocephalic, without obvious abnormality, atraumatic Neck: no adenopathy, supple,  symmetrical, trachea midline and thyroid not enlarged, symmetric, no tenderness/mass/nodules Lungs: clear to auscultation bilaterally Breasts: Inspection negative, No nipple retraction or dimpling, No nipple discharge or bleeding, No axillary or supraclavicular adenopathy, Normal to palpation without dominant masses Heart: regular rate and rhythm Abdomen: soft, non-tender; no masses,  no organomegaly Extremities: extremities normal, atraumatic, no cyanosis or edema Skin: Skin color, texture, turgor normal. No rashes or lesions Lymph nodes: Cervical, supraclavicular, and axillary nodes normal. No abnormal inguinal nodes palpated Neurologic: Grossly normal  Pelvic: External genitalia:  Right perianal region and left labia minora with healing biopsy sites.  No excoriation or fissures.  Whitish change to the periclitoral region.  No erythema to perianal region.               Urethra:  normal appearing urethra with no masses, tenderness or lesions              Bartholins and Skenes: normal                 Vagina: normal appearing vagina with normal color and discharge, no lesions              Cervix: normal appearance              Pap and high risk HPV testing done: yes.            Bimanual Exam:  Uterus:  uterus is normal size, shape, consistency and nontender                                      Adnexa: normal adnexa in size, nontender and no masses                                      Rectovaginal: Confirms                                      Anus:  normal sphincter tone, no lesions  ASSESSMENT  Normal gynecologic exam. Lichen sclerosus.  Controlled with Clobetasol ointment.  Menopause. Smoker.  PLAN  Mammogram recommended yearly.  Pap smear and high risk HPV testing today.  Counseled on self breast exam, Calcium and vitamin D intake, exercise. Bone density ordered.  Refill on Clobetasol ointment.  Patient will try to decrease use to twice weekly to control symptoms.  Return  annually or prn   An After Visit Summary was printed and given to the patient.

## 2013-06-07 LAB — IPS PAP TEST WITH HPV

## 2013-06-13 ENCOUNTER — Telehealth: Payer: Self-pay | Admitting: Emergency Medicine

## 2013-06-13 DIAGNOSIS — IMO0002 Reserved for concepts with insufficient information to code with codable children: Secondary | ICD-10-CM

## 2013-06-13 NOTE — Telephone Encounter (Signed)
Message copied by Kierrah Kilbride, Marc MorgansRACY L on Wed Jun 13, 2013  9:30 AM ------      Message from: AMUNDSON DE Gwenevere GhaziARVALHO E SILVA, BROOK E      Created: Thu Jun 07, 2013  6:09 PM       Please report abnormal pap showing ASCUS and positive high risk HPV.      She needs colpo with me.      She has a remote history of an abnormal pap. ------

## 2013-06-13 NOTE — Telephone Encounter (Signed)
Thank you :)

## 2013-06-13 NOTE — Telephone Encounter (Signed)
Identity verified. Ensured this was a good time to discuss lab results. Patient informed of lab results with abnormal pap. Advised will need colposcopy for evaluation for further testing. Advised that colposcopy is very important test for follow up to evaluate cells. Patient agreeable to scheduling at this time.       Motrin instructions given. Motrin=Advil=Ibuprofen Can take 800 mg (Can purchase over the counter, you will need four 200 mg pills) for cramps.  Take with food. Make sure to eat a meal before appointment and drink plenty of fluids. Patient verbalized understanding. Patient post-menopausal.   Scheduled for 07/02/13 at 2:30 with Dr. Edward JollySilva.   Sabrina, I placed order, can you precert and link order?

## 2013-06-27 ENCOUNTER — Telehealth: Payer: Self-pay | Admitting: Obstetrics and Gynecology

## 2013-06-27 NOTE — Telephone Encounter (Signed)
Pt needs to reschedule her colpo procedure that was scheduled for 3/30 with dr Edward Jollysilva. Can also try her at work. [phone]

## 2013-07-02 ENCOUNTER — Ambulatory Visit: Payer: Managed Care, Other (non HMO) | Admitting: Obstetrics and Gynecology

## 2013-07-05 ENCOUNTER — Other Ambulatory Visit: Payer: Self-pay | Admitting: Internal Medicine

## 2013-07-05 DIAGNOSIS — F17200 Nicotine dependence, unspecified, uncomplicated: Secondary | ICD-10-CM

## 2013-07-24 NOTE — Telephone Encounter (Signed)
Calling patient to change patient from 08/15/13 appointment with Dr. Edward JollySilva to another day due to schedule.  Patient is scheduled for Colpo 08/15/13 with Dr. Edward JollySilva.   Message left to return call to [place] at [phone].

## 2013-07-24 NOTE — Telephone Encounter (Signed)
Patient returned call. She is agreeable to rescheduling, has cataract surgery scheduled for 08/13/13 now. Changed date to 08/29/13 at 1500. Patient agreeable and understands plan.   Routing to provider for final review. Patient agreeable to disposition. Will close encounter

## 2013-08-15 ENCOUNTER — Other Ambulatory Visit: Payer: Managed Care, Other (non HMO)

## 2013-08-15 ENCOUNTER — Ambulatory Visit: Payer: Managed Care, Other (non HMO) | Admitting: Obstetrics and Gynecology

## 2013-08-24 ENCOUNTER — Telehealth: Payer: Self-pay | Admitting: Obstetrics and Gynecology

## 2013-08-24 NOTE — Telephone Encounter (Signed)
Please contact the patient to have [place] understand what is the barrier to her receiving care, i.e. The colposcopy.   If she is concerned about finances, we can refer her to the Clinic at the [facility].   Thanks.

## 2013-08-24 NOTE — Telephone Encounter (Signed)
Patient has canceled colpo three times since March.  How would you like to proceed?

## 2013-08-24 NOTE — Telephone Encounter (Signed)
Patient needs to reschedule her upcoming colpo appointment with Dr.Silva 08/29/13.

## 2013-08-28 NOTE — Telephone Encounter (Signed)
Spoke with patient. She returned call to r/s. She states that time is her main barrier. She requested a June appointment and then changed her mind about June and requested July. Advised that I could schedule this appointment but that waiting until July would not be advisable.  Advised that scheduling colposcopy is not something that should be delayed as it is a diagnostic test for abnormal cells on her cervix up to and including cancer cells.  Patient states she is aware and verbalized understanding.   Patient is scheduled for 10/10/13 at 1130 with Dr. Edward Jolly.   Patient states she is aware of pre-procedure instructions.   Routing to provider for final review. Patient agreeable to disposition. Will close encounter  Cc Billie Ruddy, RN

## 2013-08-28 NOTE — Telephone Encounter (Signed)
Message left to return call to Angela Alvarez at [phone].    

## 2013-08-29 ENCOUNTER — Ambulatory Visit: Payer: Managed Care, Other (non HMO) | Admitting: Obstetrics and Gynecology

## 2013-10-03 ENCOUNTER — Encounter: Payer: Self-pay | Admitting: *Deleted

## 2013-10-03 ENCOUNTER — Telehealth: Payer: Self-pay | Admitting: Obstetrics and Gynecology

## 2013-10-03 NOTE — Telephone Encounter (Signed)
Please see note I just routed to [place].  Patient will receive written communication from us at this point.

## 2013-10-03 NOTE — Telephone Encounter (Signed)
Thank you for facilitating this appointment and educating the patient about the importance of follow up of her abnormal pap.

## 2013-10-03 NOTE — Telephone Encounter (Signed)
LMTCB trying to confirms pts appt ( noticed insurance on file is rejected also is there a new insurance?)

## 2013-10-03 NOTE — Telephone Encounter (Signed)
Patient called back. Advised that per benefit quote received, she will be responsible for $368.82 at the time of colpo visit. Patient agreeable. Mailed the In-Office procedure form that includes appointment date and time, patient copay, and cancellation policy.

## 2013-10-03 NOTE — Telephone Encounter (Signed)
Letter confirming my call to patient to provider for review.

## 2013-10-03 NOTE — Telephone Encounter (Signed)
Left message for patient to call back to discuss benefits for colpo under new plan. ZO:$109.60PR:$368.82

## 2013-10-03 NOTE — Telephone Encounter (Signed)
Tiffany, I will call her.

## 2013-10-03 NOTE — Telephone Encounter (Signed)
This patient is cancelling her fourth appointment for colposcopy for ASCUS with positive HR HPV.  It is time to send her a 30 day letter. She will need to have the colposcopy performed to continue with care from our office.  I will not reschedule any further appointments for colposcopy if she calls again to do this.  If finances are a barrier, we can send [facility].

## 2013-10-03 NOTE — Telephone Encounter (Signed)
Patient returned my call states that she wants to reschedule this appt again said that she was offered a trip to stay at a beach house next week and it includes Wednesday. Said she can take any time afte this in the afternoon. Pt states she is not trying to put this off but things keep coming up. The was a note on the appt to let you know if she canceled for any reason. She did say she had new insurance and would be faxing that information to us.I advised her that i could not rescheduled this appt someone would have to call her. Do you want her to be rescheduled?

## 2013-10-03 NOTE — Telephone Encounter (Signed)
Call to patient, advised we have office policy that repetitive cancels/resheduled appointments can results in discharge from practice. The canceled appointments are for a procedure which has been recommended due to abnormal pap and needs to be completed for further evaluation.  Advised this must be rescheduled and appointment kept within next  thirty (30) days to avoid discharge from practice and she will receive certified letter to confirm this call. Colpo rescheduled to 10-29-13 at 3pm.

## 2013-10-03 NOTE — Telephone Encounter (Signed)
Okay So she has to keep the appt for next wed in order to continue here?

## 2013-10-04 NOTE — Telephone Encounter (Signed)
Certified letter mailed today.  Encounter closed.

## 2013-10-10 ENCOUNTER — Ambulatory Visit: Payer: Managed Care, Other (non HMO) | Admitting: Obstetrics and Gynecology

## 2013-10-29 ENCOUNTER — Encounter: Payer: Self-pay | Admitting: Obstetrics and Gynecology

## 2013-10-29 ENCOUNTER — Ambulatory Visit (INDEPENDENT_AMBULATORY_CARE_PROVIDER_SITE_OTHER): Payer: 59 | Admitting: Obstetrics and Gynecology

## 2013-10-29 VITALS — BP 134/70 | HR 96 | Resp 18 | Ht 65.0 in | Wt 173.0 lb

## 2013-10-29 DIAGNOSIS — IMO0002 Reserved for concepts with insufficient information to code with codable children: Secondary | ICD-10-CM

## 2013-10-29 DIAGNOSIS — R6889 Other general symptoms and signs: Secondary | ICD-10-CM

## 2013-10-29 DIAGNOSIS — N952 Postmenopausal atrophic vaginitis: Secondary | ICD-10-CM

## 2013-10-29 MED ORDER — CLOBETASOL PROPIONATE 0.05 % EX CREA: 1.0000 "application " | TOPICAL_CREAM | CUTANEOUS | Status: DC

## 2013-10-29 MED ORDER — ESTROGENS, CONJUGATED 0.625 MG/GM VA CREA: 1.0000 | TOPICAL_CREAM | Freq: Every day | VAGINAL | Status: DC

## 2013-10-29 NOTE — Patient Instructions (Addendum)
Go to www.[email or web address].com to look for coupons for a significant reduction in price for the prescription. I will see you back in 2 months for a pap smear.  We will then decide if you will need to do the colposcopy.   Atrophic Vaginitis Atrophic vaginitis is a problem of low levels of estrogen in women. This problem can happen at any age. It is most common in women who have gone through menopause ("the change").  HOW WILL I KNOW IF I HAVE THIS PROBLEM? You may have:  Trouble with peeing (urinating), such as:  Going to the bathroom often.  A hard time holding your pee until you reach a bathroom.  Leaking pee.  Having pain when you pee.  Itching or a burning feeling.  Vaginal bleeding and spotting.  Pain during sex.  Dryness of the vagina.  A yellow, bad-smelling fluid (discharge) coming from the vagina. HOW WILL MY DOCTOR CHECK FOR THIS PROBLEM?  During your exam, your doctor will likely find the problem.  If there is a vaginal fluid, it may be checked for infection. HOW WILL THIS PROBLEM BE TREATED? Keep the vulvar skin as clean as possible. Moisturizers and lubricants can help with some of the symptoms. Estrogen replacement can help. There are 2 ways to take estrogen:  Systemic estrogen gets estrogen to your whole body. It takes many weeks or months before the symptoms get better.  You take an estrogen pill.  You use a skin patch. This is a patch that you put on your skin.  If you still have your uterus, your doctor may ask you to take a hormone. Talk to your doctor about the right medicine for you.  Estrogen cream.  This puts estrogen only at the part of your body where you apply it. The cream is put into the vagina or put on the vulvar skin. For some women, estrogen cream works faster than pills or the patch. CAN ALL WOMEN WITH THIS PROBLEM USE ESTROGEN? No. Women with certain types of cancer, liver problems, or problems with blood clots should not take estrogen. Your  doctor can help you decide the best treatment for your symptoms. Document Released: 09/08/2007 Document Revised: 03/27/2013 Document Reviewed: 09/08/2007 [facility] Patient Information 2015 [place], [place]. This information is not intended to replace advice given to you by your health care provider. Make sure you discuss any questions you have with your health care provider.  Conjugated Estrogens vaginal cream What is this medicine? CONJUGATED ESTROGENS (CON ju gate ed ESS troe jenz) are a mixture of female hormones. This cream can help relieve symptoms associated with menopause.like vaginal dryness and irritation. This medicine may be used for other purposes; ask your health care provider or pharmacist if you have questions. COMMON BRAND NAME(S): Premarin What should I tell my health care provider before I take this medicine? They need to know if you have any of these conditions: -abnormal vaginal bleeding -blood vessel disease or blood clots -breast, cervical, endometrial, or uterine cancer -dementia -diabetes -gallbladder disease -heart disease or recent heart attack -high blood pressure -high cholesterol -high level of calcium in the blood -hysterectomy -kidney disease -liver disease -migraine headaches -protein C deficiency -protein S deficiency -stroke -systemic lupus erythematosus (SLE) -tobacco smoker -an unusual or allergic reaction to estrogens other medicines, foods, dyes, or preservatives -pregnant or trying to get pregnant -breast-feeding How should I use this medicine? This medicine is for use in the vagina only. Do not take by mouth. Follow the directions on  the prescription label. Use at bedtime unless otherwise directed by your doctor or health care professional. Use the special applicator supplied with the cream. Wash hands before and after use. Fill the applicator with the cream and remove from the tube. Lie on your back, part and bend your knees. Insert the  applicator into the vagina and push the plunger to expel the cream into the vagina. Wash the applicator with warm soapy water and rinse well. Use exactly as directed for the complete length of time prescribed. Do not stop using except on the advice of your doctor or health care professional. Talk to your pediatrician regarding the use of this medicine in children. Special care may be needed. A patient package insert for the product will be given with each prescription and refill. Read this sheet carefully each time. The sheet may change frequently. Overdosage: If you think you have taken too much of this medicine contact a poison control center or emergency room at once. NOTE: This medicine is only for you. Do not share this medicine with others. What if I miss a dose? If you miss a dose, use it as soon as you can. If it is almost time for your next dose, use only that dose. Do not use double or extra doses. What may interact with this medicine? Do not take this medicine with any of the following medications: -aromatase inhibitors like aminoglutethimide, anastrozole, exemestane, letrozole, testolactone This medicine may also interact with the following medications: -barbiturates used for inducing sleep or treating seizures -carbamazepine -grapefruit juice -medicines for fungal infections like itraconazole and ketoconazole -raloxifene or tamoxifen -rifabutin -rifampin -rifapentine -ritonavir -some antibiotics used to treat infections -St. John's Wort -warfarin This list may not describe all possible interactions. Give your health care provider a list of all the medicines, herbs, non-prescription drugs, or dietary supplements you use. Also tell them if you smoke, drink alcohol, or use illegal drugs. Some items may interact with your medicine. What should I watch for while using this medicine? Visit your health care professional for regular checks on your progress. You will need a regular  breast and pelvic exam. You should also discuss the need for regular mammograms with your health care professional, and follow his or her guidelines. This medicine can make your body retain fluid, making your fingers, hands, or ankles swell. Your blood pressure can go up. Contact your doctor or health care professional if you feel you are retaining fluid. If you have any reason to think you are pregnant; stop taking this medicine at once and contact your doctor or health care professional. Tobacco smoking increases the risk of getting a blood clot or having a stroke, especially if you are more than 59 years old. You are strongly advised not to smoke. If you wear contact lenses and notice visual changes, or if the lenses begin to feel uncomfortable, consult your eye care specialist. If you are going to have elective surgery, you may need to stop taking this medicine beforehand. Consult your health care professional for advice prior to scheduling the surgery. What side effects may I notice from receiving this medicine? Side effects that you should report to your doctor or health care professional as soon as possible: -allergic reactions like skin rash, itching or hives, swelling of the face, lips, or tongue -breast tissue changes or discharge -changes in vision -chest pain -confusion, trouble speaking or understanding -dark urine -general ill feeling or flu-like symptoms -light-colored stools -nausea, vomiting -pain, swelling, warmth in  the leg -right upper belly pain -severe headaches -shortness of breath -sudden numbness or weakness of the face, arm or leg -trouble walking, dizziness, loss of balance or coordination -unusual vaginal bleeding -yellowing of the eyes or skin Side effects that usually do not require medical attention (report to your doctor or health care professional if they continue or are bothersome): -hair loss -increased hunger or thirst -increased urination -symptoms of  vaginal infection like itching, irritation or unusual discharge -unusually weak or tired This list may not describe all possible side effects. Call your doctor for medical advice about side effects. You may report side effects to FDA at 1-800-FDA-1088. Where should I keep my medicine? Keep out of the reach of children. Store at room temperature between 15 and 30 degrees C (59 and 86 degrees F). Throw away any unused medicine after the expiration date. NOTE: This sheet is a summary. It may not cover all possible information. If you have questions about this medicine, talk to your doctor, pharmacist, or health care provider.  2015, Elsevier/Gold Standard. (2010-06-24 09:20:36)

## 2013-10-29 NOTE — Progress Notes (Signed)
Subjective:     Patient ID: Angela Alvarez, female   DOB: 1955-08-30, 58 y.o.   MRN: [id number]  HPI  58 year old female here for colposcopy.  Pap 06/01/13 showed ASCUS and positive HR HPV on 06/01/13.  History of cryotherapy to cervix in 1985.  Follow up paps normal.  Asking for refill on the clobetasole cream using once a week and needs to use twice weekly to control symptoms of lichen sclerosus of the vulva.   Review of Systems     Objective:   Physical Exam  Genitourinary:    Colposcopy procedure Consent performed.  Graves speculum uncomfortable.  Pederson speculum placed and caused left vaginal wall abrasion and laceration from atrophy.  Acetic acid placed and patient unable to tolerate exam.  Cervix stenotic.     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  Lichen sclerosus of the vulva treated with clobetasol.     Plan:     Colposcopy abandoned.  I apologized to patient for her discomfort.  She understands and says it is OK. Will treat with Premarin vaginal cream 1/2 gm pv at hs for 2 weeks and then twice weekly.  See Epic orders. I discussed risk of MI, stroke, DVT, and PE.   Refill on Clobetasol cream.  See Epic orders. Return in 8 weeks for a repeat pap.  Will reassess for potential colposcopy at that time.     25 minutes face to face time of which over 50% was spent in counseling.   After visit summary to patient.

## 2013-11-29 ENCOUNTER — Encounter: Payer: Self-pay | Admitting: Obstetrics and Gynecology

## 2013-12-31 ENCOUNTER — Ambulatory Visit: Payer: 59 | Admitting: Obstetrics and Gynecology

## 2014-01-09 ENCOUNTER — Ambulatory Visit: Payer: 59 | Admitting: Obstetrics and Gynecology

## 2014-01-09 ENCOUNTER — Encounter: Payer: Self-pay | Admitting: Obstetrics and Gynecology

## 2014-01-09 ENCOUNTER — Ambulatory Visit (INDEPENDENT_AMBULATORY_CARE_PROVIDER_SITE_OTHER): Payer: 59 | Admitting: Obstetrics and Gynecology

## 2014-01-09 VITALS — BP 140/78 | HR 100 | Ht 65.0 in | Wt 172.2 lb

## 2014-01-09 DIAGNOSIS — R87811 Vaginal high risk human papillomavirus (HPV) DNA test positive: Secondary | ICD-10-CM

## 2014-01-09 DIAGNOSIS — R87620 Atypical squamous cells of undetermined significance on cytologic smear of vagina (ASC-US): Secondary | ICD-10-CM | POA: Insufficient documentation

## 2014-01-09 MED ORDER — ESTROGENS, CONJUGATED 0.625 MG/GM VA CREA
TOPICAL_CREAM | VAGINAL | Status: DC
Start: 2014-01-09 — End: 2015-04-04

## 2014-01-09 NOTE — Progress Notes (Signed)
Patient ID: Angela Alvarez, female   DOB: 07/14/1955, 58 y.o.   MRN: [id number] GYNECOLOGY  VISIT   HPI: 58 y.o.   Single  Caucasian  female   [id number] with Patient's last menstrual period was 04/05/2004.   here for  Repeat pap smear.    Pap 06/01/13 showed ASCUS and positive HR HPV on 06/01/13.  History of cryotherapy to cervix in 1985. Follow up paps normal. Colposcopy in July 2015 - Patient had severe atrophy and I was not able to complete the colposcopy exam due to this and vaginal wall laceration from the speculum. Patient taking Premarin cream for the vagina and clobetasol ointment for the vulva for lichen sclerosus.   Having bronchitis symptoms.   GYNECOLOGIC HISTORY: Patient's last menstrual period was 04/05/2004. Contraception: postmenopausal   Menopausal hormone therapy: Premarin vaginal cream        OB History   Grav Para Term Preterm Abortions TAB SAB Ect Mult Living   1    1  1             Patient Active Problem List   Diagnosis Date Noted  . Foot pain 12/04/2010  . Metatarsalgia 12/04/2010  . Right knee pain 11/23/2010  . SMOKER 01/08/2010  . COPD 01/07/2010    Past Medical History  Diagnosis Date  . Asthma   . COPD (chronic obstructive pulmonary disease)   . Abnormal Pap smear of cervix 06-01-13    ascus pap:pos.HR HPV/hx of cryotherapy to cx 1985    Past Surgical History  Procedure Laterality Date  . Bunionectomy      bilateral  . Breast surgery    . Surgery under arm Right     --benign breast tissue    Current Outpatient Prescriptions  Medication Sig Dispense Refill  . albuterol (PROVENTIL,VENTOLIN) 90 MCG/ACT inhaler Inhale 2 puffs into the lungs every 4 (four) hours as needed.        . budesonide-formoterol (SYMBICORT) 160-4.5 MCG/ACT inhaler Inhale 2 puffs into the lungs 2 (two) times daily.        . clobetasol cream (TEMOVATE) 0.05 % Apply 1 application topically 2 (two) times a week.  30 g  1  . conjugated estrogens (PREMARIN) vaginal cream  Place 1 Applicatorful vaginally daily. Use 1/2 g vaginally every night for 2 weeks, then use 1/2 g vaginally two times per week.  60 g  1  . ipratropium-albuterol (DUONEB) 0.5-2.5 (3) MG/3ML SOLN Take 1 mL by nebulization as needed.       No current facility-administered medications for this visit.     ALLERGIES: Amoxicillin-pot clavulanate  Family History  Problem Relation Age of Onset  . Diabetes Neg Hx   . Heart attack Neg Hx   . Hyperlipidemia Neg Hx   . Sudden death Neg Hx   . Stroke Mother     dec age 22  . Hypertension Mother   . Cancer Father     lung age 10  . Cancer Sister     dec colon ca age 53  . Hypertension Sister     History   Social History  . Marital Status: Single    Spouse Name: N/A    Number of Children: N/A  . Years of Education: N/A   Occupational History  . Not on file.   Social History Main Topics  . Smoking status: Current Every Day Smoker -- 1.00 packs/day    Types: Cigarettes  . Smokeless tobacco: Not on file  . Alcohol Use: No  .  Drug Use: No  . Sexual Activity: No   Other Topics Concern  . Not on file   Social History Narrative  . No narrative on file    ROS:  Pertinent items are noted in HPI.  PHYSICAL EXAMINATION:    BP 140/78  Pulse 100  Ht 5\' 5"  (1.651 m)  Wt 172 lb 3.2 oz (78.109 kg)  BMI 28.66 kg/m2  LMP 04/05/2004     General appearance: alert, cooperative and appears stated age Lungs: clear to auscultation bilaterally Heart: regular rate and rhythm Abdomen: soft, non-tender; no masses,  no organomegaly No abnormal inguinal nodes palpated  Pelvic: External genitalia:  no lesions              Urethra:  normal appearing urethra with no masses, tenderness or lesions              Bartholins and Skenes: normal                 Vagina: normal appearing vagina with normal color and discharge, no lesions, pink mucosa with rugae.  Still some discomfort with speculum use.               Cervix: normal appearance                    Bimanual Exam:  Uterus:  uterus is normal size, shape, consistency and nontender                                      Adnexa: normal adnexa in size, nontender and no masses                                     ASSESSMENT  ASCUS pap and positive HR HPV.  Inability to do colposcopy due to severe atrophy and intolerance of examination.  Postmenopausal atrophy treated with vaginal estrogen.  Lichen sclerosus.  PLAN  Pap and HR HPV testing repeated today.  May still need colposcopy depending on the results of pap.  Will increase the vaginal estrogen to 1/2 mg pv 3 times weekly.     An After Visit Summary was printed and given to the patient.  ___15___ minutes face to face time of which over 50% was spent in counseling.

## 2014-01-13 LAB — IPS PAP TEST WITH HPV

## 2014-01-22 ENCOUNTER — Telehealth: Payer: Self-pay | Admitting: Emergency Medicine

## 2014-01-22 DIAGNOSIS — R87610 Atypical squamous cells of undetermined significance on cytologic smear of cervix (ASC-US): Secondary | ICD-10-CM

## 2014-01-22 DIAGNOSIS — R87810 Cervical high risk human papillomavirus (HPV) DNA test positive: Principal | ICD-10-CM

## 2014-01-22 NOTE — Telephone Encounter (Signed)
Patient notified of message from Dr. Edward JollySilva.  She is agreeable to scheduling colposcopy. Brief description of procedure given to patient.  Colposcopy pre-procedure instructions given. Advised 800 mg of Motrin with food one hour prior to appointment. Motrin=Advil=Ibuprofen Can take 800 mg (Can purchase over the counter, you will need four 200 mg pills). Make sure to eat a meal before appointment and drink plenty of fluids. Advised will need to cancel within 24 hours or will have $100.00 no show fee placed to account. Patient verbalized understanding of all instructions.   Patient requests appointment be made for at least one month out so that she can have time to continue using Vaginal estrogen.   Order placed for colposcopy. Advised patient would be contacted regarding out of pocket expenses.  Patient has questions regarding costs r/t procedure that was done prior. Patient feels she has already paid for this procedure.  Advised that I could have someone from billing call her to discuss. Patient agreeable.  Routing to Dr. Edward JollySilva and Cathrine MusterSabrina Franklin.

## 2014-01-22 NOTE — Telephone Encounter (Signed)
Message copied by Joeseph AmorFAST, Laren Orama L on Tue Jan 22, 2014  1:45 PM ------      Message from: Ricki MillerAMUNDSON DE Gwenevere GhaziARVALHO E SILVA, BROOK E      Created: Sun Jan 13, 2014  6:30 PM       Please contact patient with results for ASCUS pap and positive HR HPV results.       I do recommend proceeding with the colposcopy now.       I do recommend using the vaginal estrogen cream 1/2 gram pv 3 times per week.  (Has this.)      We were unable to do the colposcopy some months ago due to severe atrophy of the vagina. ------

## 2014-01-22 NOTE — Telephone Encounter (Signed)
Angela Alvarez 05-Nov-1955 Per discussion with Janett Billow, patient paid [phone] for colpo during July visit. Dr Quincy Simmonds abandoned colpo and opted not to charge patient. $25 of the payment was OV copay for this visit. Patient has credit of $343.82. Based on new colpo rate and credit, patient reponsibility is $35.15 when she comes in for colpo. Patient has met $0 towards her $1000 deductible.

## 2014-01-22 NOTE — Telephone Encounter (Signed)
Spoke with patient. Advised that she will be responsible to pay  $35.15 when she comes in for colposcopy.

## 2014-01-23 NOTE — Telephone Encounter (Signed)
Thank you.  I will close the encounter. 

## 2014-02-04 ENCOUNTER — Encounter: Payer: Self-pay | Admitting: Obstetrics and Gynecology

## 2014-03-07 ENCOUNTER — Encounter: Payer: Self-pay | Admitting: Obstetrics and Gynecology

## 2014-03-07 ENCOUNTER — Ambulatory Visit (INDEPENDENT_AMBULATORY_CARE_PROVIDER_SITE_OTHER): Payer: 59 | Admitting: Obstetrics and Gynecology

## 2014-03-07 VITALS — BP 148/88 | HR 90 | Resp 18 | Ht 65.0 in | Wt 183.2 lb

## 2014-03-07 DIAGNOSIS — R87620 Atypical squamous cells of undetermined significance on cytologic smear of vagina (ASC-US): Secondary | ICD-10-CM

## 2014-03-07 DIAGNOSIS — R87610 Atypical squamous cells of undetermined significance on cytologic smear of cervix (ASC-US): Secondary | ICD-10-CM

## 2014-03-07 DIAGNOSIS — R87811 Vaginal high risk human papillomavirus (HPV) DNA test positive: Secondary | ICD-10-CM

## 2014-03-07 DIAGNOSIS — R87810 Cervical high risk human papillomavirus (HPV) DNA test positive: Secondary | ICD-10-CM

## 2014-03-07 NOTE — Patient Instructions (Signed)
We will call as soon as the results are back from the endocervical curettings taken today.

## 2014-03-07 NOTE — Progress Notes (Signed)
Subjective:     Patient ID: Angela Alvarez, female   DOB: 1956-03-01, 58 y.o.   MRN: [id number]  HPI  Here for colposcopy.  Pap 01/09/14 - ASCUS, positive HR HPV.   Had to abandon prior colposcopy on 10/29/13 due to severe atrophy.  Pap 06/01/13 - ASCUS, positive HR HPV.   Using vaginal Premarin cream.   Also using Clobetasol cream for lichen sclerosus.  Stopped smoking 2 months ago.  Does feel more calm.   Review of Systems    See HPI. Objective:   Physical Exam  Genitourinary:    Procedure Consent for colposcopy.  Speculum placed.  3% acetic acid placed in vagina.  Unsatisfactory colposcopy.  ECC performed with pap brush.  To GPA.  No complications.  No EBL.     Assessment:     ASCUS pap and positive HR HPV.  Unsatisfactory colposcopy.  Severe atrophy of the vagina improved with local vaginal estrogen.  Difficulty with speculum exams.     Plan:     Attempt to do ECC with pap brush today.  I have discussed with the patient that she may need a LEEP procedure in the OR setting.  I recommended continued treatment with vaginal estrogen cream.   After visit summary to patient.

## 2014-03-11 NOTE — Addendum Note (Signed)
Addended by: Alphonsa OverallIXON, Orvil Faraone L on: 03/11/2014 03:03 PM   Modules accepted: Medications

## 2014-03-13 ENCOUNTER — Telehealth: Payer: Self-pay | Admitting: Obstetrics and Gynecology

## 2014-03-13 NOTE — Telephone Encounter (Signed)
Pt says she is returning call.

## 2014-03-14 NOTE — Telephone Encounter (Signed)
Returned patients call and left a message for her to call me back.

## 2014-03-21 ENCOUNTER — Telehealth: Payer: Self-pay | Admitting: Emergency Medicine

## 2014-03-21 NOTE — Telephone Encounter (Signed)
Call to patient to discuss results. Patient agreeable to consult appointment and potential for surgical procedure. She is asking for insurance estimate and potential to proceed this year. She is currently driving and will call when she arrives at destination to schedule appointment.

## 2014-03-21 NOTE — Telephone Encounter (Signed)
-----   Message from [place] E A[place]ds[place] de Gwenevere Ghaziarvalho E Silva, MD sent at 03/14/2014 10:55 PM EST ----- Please share with patient results of her colposcopy biopsy showing atypia.  Her colposcopy was unsatisfactory, meaning I could not see inside the canal to sample the tissue well because of scarring on her cervix.  I would like to have her come to the office to discuss LEEP, which I am recommending be done in the hospital setting.  I already mentioned this as a possibility to her.   Cc - Claudette LawsAmanda Dixon

## 2014-03-27 NOTE — Telephone Encounter (Signed)
Pt returning call

## 2014-03-27 NOTE — Telephone Encounter (Signed)
Patient is [facility] a [facility] to [facility] Patient is checking on the refund check.

## 2014-03-27 NOTE — Telephone Encounter (Signed)
Return call to patient, left message to call back. Can speak to triage if I am not available.

## 2014-03-27 NOTE — Telephone Encounter (Signed)
Follow-up call to patient regarding consult and possible procedure. Left message to call back.

## 2014-04-02 NOTE — Telephone Encounter (Signed)
Follow up call to patient regarding refund, left message to call back.

## 2014-04-03 NOTE — Telephone Encounter (Signed)
Return call to patient. Left message returning call. Patient needs to speak to PurcellJessica as well as Kennon RoundsSally.

## 2014-04-08 NOTE — Telephone Encounter (Signed)
Call to patient regarding scheduling appointment. Left message to call back.

## 2014-04-12 ENCOUNTER — Ambulatory Visit: Payer: Managed Care, Other (non HMO) | Admitting: Obstetrics and Gynecology

## 2014-04-12 NOTE — Telephone Encounter (Signed)
Call back to patient regarding appointment. Left message to call back.

## 2014-04-17 NOTE — Telephone Encounter (Signed)
Chart sent to [facility] EnterprisesSabrina/ insurance dept to update precert info that was initially started on 03-21-14.

## 2014-04-17 NOTE — Telephone Encounter (Signed)
-----   Message from Vania ReaJessica M Harris sent at 04/17/2014  3:47 PM EST ----- Regarding: Leep Scheduling Angela Alvarez, I just spoke to Ms Angela Alvarez about her balance from her December Colpo. She also mentioned that she will need to call you back (it looks like you have left her several messages) to schedule.  She said she would to find out what her financial responsibility will be for the LEEP procedure. Do you have her chart? Does Sabrina need to have it in order to contact the patient with this information? Thank you Shanda BumpsJessica

## 2014-04-18 NOTE — Telephone Encounter (Signed)
Verified patients 2016 benefits. Call to patient to go over benefits. Left message for patient to call back.

## 2014-04-19 ENCOUNTER — Telehealth: Payer: Self-pay | Admitting: *Deleted

## 2014-04-19 NOTE — Telephone Encounter (Signed)
Received call transferred from business office. Patient states she is not ready to proceed with scheduling procedure at this point. Still working on financial issues with billing department. Advised that this is an important procedure to evaluate and treat abnormal cells which have not yet been fully evaluated. This is not something we would recommend she continue to delay scheduling as it has already been several weeks. Advised she can start with consult with Dr Edward JollySilva to discuss results and recommended procedure. Doesn't have to schedule procedure until she is ready.  Patient agreeable to this. OV scheduled for 05-13-14 at 3pm. Declined earlier appointment. Call transferred back to billing office per patient request.  Routing to provider for final review. Patient agreeable to disposition. Will close encounter

## 2014-04-19 NOTE — Telephone Encounter (Signed)
See previous phone note. Spoke to patient earlier today and scheduled OV to discuss colpo results and possible LEEP procedure.  During patients call with business office, she asked to speak with me regarding additional questions.  Return call to patient, Left message to call back.

## 2014-04-19 NOTE — Telephone Encounter (Signed)
Spoke with patient. Advised that per benefit quote received, she will be responsible for $470.97 for the surgeon portion of her surgery. Advised that payment is due in full at least 3 weeks prior to scheduled surgery date. Patient agreeable. Advised that she will receive separate communication from the hospital regarding facility charges/fees. Patient agreeable. Passed call to Kennon RoundsSally to discuss scheduling.

## 2014-05-01 NOTE — Telephone Encounter (Signed)
Call back to patient to see if she still had questions she wanted to ask before consult on 05-13-14 with Dr Edward JollySilva. Patient with questions regarding the results and what causes this. Advised that colpo reveals atypical cells but is also unsatisfactory, meaning that we are not able to fully evaluate the cervix and determine the extent of abnormality. Confirmed that these changes are generally associated with HPV.  Advised Dr Edward JollySilva will discuss results and recommendation for procedure in detail at consult appointment on 05-13-14.  Patient will need to see me after consult when ready to proceed with scheduling.  Routing to provider for final review. Patient agreeable to disposition. Will close encounter  Routing to Dr Hyacinth MeekerMiller while Dr Edward JollySilva is out of office.

## 2014-05-13 ENCOUNTER — Encounter: Payer: Self-pay | Admitting: Obstetrics and Gynecology

## 2014-05-13 ENCOUNTER — Ambulatory Visit (INDEPENDENT_AMBULATORY_CARE_PROVIDER_SITE_OTHER): Payer: 59 | Admitting: Obstetrics and Gynecology

## 2014-05-13 VITALS — BP 158/80 | HR 80 | Ht 65.0 in | Wt 180.2 lb

## 2014-05-13 DIAGNOSIS — R896 Abnormal cytological findings in specimens from other organs, systems and tissues: Secondary | ICD-10-CM

## 2014-05-13 DIAGNOSIS — N879 Dysplasia of cervix uteri, unspecified: Secondary | ICD-10-CM

## 2014-05-13 DIAGNOSIS — IMO0002 Reserved for concepts with insufficient information to code with codable children: Secondary | ICD-10-CM

## 2014-05-13 NOTE — Patient Instructions (Signed)
Loop Electrosurgical Excision Procedure Loop electrosurgical excision procedure (LEEP) is the removal of a portion of the lower part of the uterus (cervix). The procedure is done when there are significantly abnormal cervical cell changes. Abnormal cell changes of the cervix can lead to cancer if left in place and untreated.  The LEEP procedure itself typically only takes a few minutes. Often, it may be done in your caregiver's office. The procedure is considered safe for those who wish to get pregnant or are trying to get pregnant. Only under rare circumstances should this procedure be done if you are pregnant. LET YOUR CAREGIVER KNOW ABOUT:  Whether you are pregnant or late for your last menstrual period.  Allergies to foods or medicines.  All the medicines you are taking includingherbs, eyedrops, and over-the-counter medicines, and creams.  Use of steroids (by mouth or creams).  Previous problems with anesthetics or numbing medicine.  Previous gynecological surgery.  History of blood clots or bleeding problems.  Any recent or current vaginal infections (herpes, sexually transmitted infections).  Other health problems. RISKS AND COMPLICATIONS  Bleeding.  Infection.  Injury to the vagina, bladder, or rectum.  Very rare obstruction of the cervical opening that causes problems during menstruation (cervical stenosis). BEFORE THE PROCEDURE  Do not take aspirin or blood thinners (anticoagulants) for 1 week before the procedure, or as told by your caregiver.  Eat a light meal before the procedure.  Ask your caregiver about changing or stopping your regular medicines.  You may be given a pain reliever 1 or 2 hours before the procedure. PROCEDURE   A tool (speculum) is placed in the vagina. This allows your caregiver to see the cervix.  An iodine stain is applied to the cervix to find the area of abnormal cells to be removed.  Medicine is injected to numb the cervix (local  anesthetic).   Electricity is passed through a thin wire loop which is then used to remove (cauterize) a small segment of the affected cervix.  Light electrocautery is used to seal any small blood vessels and prevent bleeding.  A paste may be applied to the cauterized area of the cervix to help prevent bleeding.  The tissue sample is sent to the lab. It is examined under the microscope. AFTER THE PROCEDURE  Have someone drive you home.  You may have slight to moderate cramping.  You may notice a black vaginal discharge from the paste used on the cervix to prevent bleeding. This is normal.  Watch for excessive bleeding. This requires immediate medical care.  Ask when your test results will be ready. Make sure you get your test results. Document Released: 06/12/2002 Document Revised: 06/14/2011 Document Reviewed: 09/01/2010 Exit[employer] Patient Information 2015 [employer]. This information is not intended to replace advice given to you by your health care provider. Make sure you discuss any questions you have with your health care provider.  

## 2014-05-13 NOTE — Progress Notes (Signed)
Patient ID: Angela KentCatherine Alvarez, female   DOB: 1955-06-22, 59 y.o.   MRN: [id number] GYNECOLOGY  VISIT   HPI: 59 y.o.   Single  Caucasian  female   G1P0010 with Patient's last menstrual period was 04/05/2004.   here for consultation regarding colposcopy results.    Pap 06/01/13 - ASCUS, positive HR HPV.  Had to abandon prior colposcopy on 10/29/13 due to severe atrophy.  Using vaginal Premarin cream.   Pap 01/09/14 - ASCUS, positive HR HPV.  Colposcopy 03/07/14 - unsatisfactory and ECC done with cytobrush showed atypia/HVP effect.  Also using Clobetasol cream for lichen sclerosus.  Smoking.   GYNECOLOGIC HISTORY: Patient's last menstrual period was 04/05/2004. Contraception: postmenopausal   Menopausal hormone therapy: Premarin vaginal cream        OB History    Gravida Para Term Preterm AB TAB SAB Ectopic Multiple Living   1    1  1             Patient Active Problem List   Diagnosis Date Noted  . ASCUS with positive high risk human papillomavirus of vagina 01/09/2014  . Foot pain 12/04/2010  . Metatarsalgia 12/04/2010  . Right knee pain 11/23/2010  . SMOKER 01/08/2010  . COPD 01/07/2010    Past Medical History  Diagnosis Date  . Asthma   . COPD (chronic obstructive pulmonary disease)   . Abnormal Pap smear of cervix 06-01-13    ascus pap:pos.HR HPV/hx of cryotherapy to cx 1985    Past Surgical History  Procedure Laterality Date  . Bunionectomy      bilateral  . Breast surgery    . Surgery under arm Right     --benign breast tissue    Current Outpatient Prescriptions  Medication Sig Dispense Refill  . albuterol (PROVENTIL,VENTOLIN) 90 MCG/ACT inhaler Inhale 2 puffs into the lungs every 4 (four) hours as needed.      . budesonide-formoterol (SYMBICORT) 160-4.5 MCG/ACT inhaler Inhale 2 puffs into the lungs 2 (two) times daily.      . clobetasol cream (TEMOVATE) 0.05 % Apply 1 application topically 2 (two) times a week. 30 g 1  . conjugated estrogens (PREMARIN)  vaginal cream Use 1/2 g vaginally two to three times per week. 60 g 1  . ipratropium-albuterol (DUONEB) 0.5-2.5 (3) MG/3ML SOLN Take 1 mL by nebulization as needed.    . varenicline (CHANTIX) 0.5 MG tablet Take 0.5 mg by mouth daily.     No current facility-administered medications for this visit.     ALLERGIES: Amoxicillin-pot clavulanate  Family History  Problem Relation Age of Onset  . Diabetes Neg Hx   . Heart attack Neg Hx   . Hyperlipidemia Neg Hx   . Sudden death Neg Hx   . Stroke Mother     dec age 60  . Hypertension Mother   . Cancer Father     lung age 60  . Cancer Sister     dec colon ca age 60  . Hypertension Sister     History   Social History  . Marital Status: Single    Spouse Name: N/A    Number of Children: N/A  . Years of Education: N/A   Occupational History  . Not on file.   Social History Main Topics  . Smoking status: Current Every Day Smoker -- 1.00 packs/day    Types: Cigarettes  . Smokeless tobacco: Not on file  . Alcohol Use: No  . Drug Use: No  . Sexual Activity: No  Other Topics Concern  . Not on file   Social History Narrative    ROS:  Pertinent items are noted in HPI.  PHYSICAL EXAMINATION:    BP 158/80 mmHg  Pulse 80  Ht  (1.651 m)  Wt 180 lb 3.2 oz (81.738 kg)  BMI 29.99 kg/m2  LMP 04/05/2004     General appearance: alert, cooperative and appears stated age  ASSESSMENT  Cervical atypia and positive HR HPV.  Unsatisfactory colposcopy.  Smoker. Atrophic vagina. Lichen sclerosus.  Difficulty with vaginal exams.   PLAN  Proceed with LEEP procedure in hospital setting.  Risks, benefits, and alternatives discussed with patient who wishes to proceed.  Risks include but are not limited to bleeding, infection, damage to surrounding organs, cervical scarring making future evaluation more difficult, further surgical intervention. Smoking cessation discussed.  Patient may pursue Chantix through her PCP.  Continue to  use vaginal estrogen.  Return for preop visit.   An After Visit Summary was printed and given to the patient.  __15____ minutes face to face time of which over 50% was spent in counseling.

## 2014-05-23 ENCOUNTER — Other Ambulatory Visit: Payer: Self-pay | Admitting: Obstetrics and Gynecology

## 2014-05-24 ENCOUNTER — Telehealth: Payer: Self-pay | Admitting: Obstetrics and Gynecology

## 2014-05-24 NOTE — Telephone Encounter (Signed)
RX was routed to Dr. Edward JollySilva (see rx request encounter)

## 2014-05-24 NOTE — Telephone Encounter (Signed)
Patient is requesting a refill of clobetasol cream (TEMOVATE) 0.05 %. Patient is out of refills and needs today. Confirmed pharmacy on file with patient. Last seen 05/13/14.

## 2014-05-24 NOTE — Telephone Encounter (Signed)
Medication refill request: Temovate 0.05 % Last AEX:  06/01/13 with Dr. Edward JollySilva Next AEX: No Aex scheduled for 2016 Last MMG (if hormonal medication request):  Refill authorized: #[id number] rfs, please advise.  Please advise Dr. Edward JollySilva patient has called in twice this morning is really needing rx.

## 2014-05-24 NOTE — Telephone Encounter (Signed)
Pt called regarding previous message. Pt has bronchitis and can only go out once and requests this asap. Best: [phone]  [facility] [place]

## 2014-05-27 ENCOUNTER — Telehealth: Payer: Self-pay | Admitting: *Deleted

## 2014-05-27 NOTE — Telephone Encounter (Signed)
LM on patient's vm that rx has been sent. 

## 2014-05-27 NOTE — Telephone Encounter (Signed)
Call to patient to follow-up on date preferences for surgery. Left message to call back.

## 2014-06-10 NOTE — Telephone Encounter (Signed)
Call to patient to check on status update. Left message to call back.

## 2014-06-20 NOTE — Telephone Encounter (Signed)
Call to patient. Left message stating it is important that I speak with her regarding scheduling. Left message to call back.

## 2014-06-22 ENCOUNTER — Encounter: Payer: Self-pay | Admitting: *Deleted

## 2014-06-22 NOTE — Telephone Encounter (Signed)
Reviewed with Dr Silva. No response from patient. 30 day letter written for MD review. 

## 2014-06-26 ENCOUNTER — Telehealth: Payer: Self-pay | Admitting: Obstetrics and Gynecology

## 2014-06-26 NOTE — Telephone Encounter (Signed)
I agree with the letter that was written.  It may be printed, signed, and sent.

## 2014-06-26 NOTE — Telephone Encounter (Signed)
Pt states she is returning Sally's call.

## 2014-06-26 NOTE — Telephone Encounter (Signed)
Return call to patient. VM confirms "Jae DireKate." left message returning her call and that letter has been mailed (certified letter was mailed this am) due to difficulty reaching her.

## 2014-06-26 NOTE — Telephone Encounter (Signed)
Letter mailed certified and regular [place] mail. Encounter closed.  

## 2014-07-29 NOTE — Telephone Encounter (Signed)
No patient response after thirty day letter mailed. Patient discharged.  Routing to provider for final review. Patient agreeable to disposition. Will close encounter

## 2015-04-04 ENCOUNTER — Encounter: Payer: Self-pay | Admitting: Podiatry

## 2015-04-04 ENCOUNTER — Telehealth: Payer: Self-pay | Admitting: *Deleted

## 2015-04-04 ENCOUNTER — Ambulatory Visit (INDEPENDENT_AMBULATORY_CARE_PROVIDER_SITE_OTHER): Payer: Managed Care, Other (non HMO) | Admitting: Podiatry

## 2015-04-04 VITALS — BP 147/94 | HR 100 | Resp 14

## 2015-04-04 DIAGNOSIS — Q828 Other specified congenital malformations of skin: Secondary | ICD-10-CM | POA: Diagnosis not present

## 2015-04-04 DIAGNOSIS — I739 Peripheral vascular disease, unspecified: Secondary | ICD-10-CM | POA: Diagnosis not present

## 2015-04-04 NOTE — Progress Notes (Signed)
   Subjective:    Patient ID: Angela KentCatherine Cazier, female    DOB: 08/25/1955, 59 y.o.   MRN: [id number]  HPI this patient presents to the office with chief complaint of a severely painful big toe on her right foot. She says that her toe is very painful walking or even putting pressure on the toe. She believes it may involve her nail. She says that her toe actually turns black when she puts her right big toe into water.Otherwise her right big toe has purplish color for weeks now. She says that her toe is very painful with sheets touching her right big toe. She denies any drainage or trauma to the toe. No self treatment. She presents the office today for an evaluation and treatment of this toe. She also requests treatment for the painful calluses on the forefeet of both feet The patient is here today with right great toe pain and b/l bottom of feet pain.   Review of Systems  All other systems reviewed and are negative.      Objective:   Physical Exam GENERAL APPEARANCE: Alert, conversant. Appropriately groomed. No acute distress.  VASCULAR: Pedal pulses palpable at  [facility] foot.  Absent/dminished pulses right foot. .  Capillary refill time is immediate to all digits exept right hallux. ,  Normal temperature gradient except right hallux.. Tight hallux has purplish coloration to whole toe.  Pain is noted to be out of proportion to what is seen.   NEUROLOGIC: sensation is normal to 5.07 monofilament at 5/5 sites bilateral.  Light touch is intact bilateral, Muscle strength normal.  MUSCULOSKELETAL: acceptable muscle strength, tone and stability bilateral.  Intrinsic muscluature intact bilateral.  Rectus appearance of foot and digits noted bilateral. HAV 1st MPJ with left worse than right.  DERMATOLOGIC: skin color, texture, and turgor are within normal limits.  No preulcerative lesions or ulcers  are seen, no interdigital maceration noted.  No open lesions present.  Digital nails are asymptomatic.  No drainage noted. Porokeratosis under 5th MPJ B/L and sub 3 left foot.         Assessment & Plan:  PVD   Porokeratosis B/L  IE  Evaluated her right hallux which is cold and pain out of proportion.  Possible blue toe syndrome.  Possible digital emboli.  Debridement of porokeratosis B/L Patient needs vascular consult for evaluation.  She was told to take ASA until her vascular appointment with D. Allyson SabalBerry.   Helane GuntherGregory Mayer DPM

## 2015-04-04 NOTE — Telephone Encounter (Signed)
Dr[facility] Stac[facility], referral and pt demographics faxed.

## 2015-04-09 ENCOUNTER — Encounter: Payer: Self-pay | Admitting: Cardiovascular Disease

## 2015-04-09 ENCOUNTER — Ambulatory Visit (INDEPENDENT_AMBULATORY_CARE_PROVIDER_SITE_OTHER): Payer: Managed Care, Other (non HMO) | Admitting: Cardiovascular Disease

## 2015-04-09 VITALS — BP 146/86 | HR 92 | Ht 66.0 in | Wt 177.0 lb

## 2015-04-09 DIAGNOSIS — I70229 Atherosclerosis of native arteries of extremities with rest pain, unspecified extremity: Secondary | ICD-10-CM

## 2015-04-09 DIAGNOSIS — I998 Other disorder of circulatory system: Secondary | ICD-10-CM

## 2015-04-09 DIAGNOSIS — I739 Peripheral vascular disease, unspecified: Secondary | ICD-10-CM | POA: Diagnosis not present

## 2015-04-09 NOTE — Assessment & Plan Note (Signed)
[name] was referred by Dr. Stacie AcresMayer for evaluation of potential critical limb ischemia. She is a 60 year old thin appearing single Caucasian female with no children is chronic risk factors include 50-pack-years tobacco abuse currently smoking one pack per day and family history. She developed a painful purplish discolored left great toe approximately 4-6 weeks ago. His has continued to progress. She denies claudication. She does have a palpable left posterior tibial and dorsalis pedis. I'm going to obtain lower extremity arterial Doppler studies to assess whether this is due to arterial insufficiency.

## 2015-04-09 NOTE — Patient Instructions (Signed)
Medication Instructions:  Your physician recommends that you continue on your current medications as directed. Please refer to the Current Medication list given to you today.   Labwork: none  Testing/Procedures: Your physician has requested that you have a lower extremity arterial doppler- During this test, ultrasound is used to evaluate arterial blood flow in the legs. Allow approximately one hour for this exam.    Follow-Up: Follow up with Dr. Berry as needed.    Any Other Special Instructions Will Be Listed Below (If Applicable).     If you need a refill on your cardiac medications before your next appointment, please call your pharmacy.   

## 2015-04-09 NOTE — Progress Notes (Signed)
04/09/2015 Angela Alvarez   1955-06-28  [id number]  Primary Physician Angela GrippeKIM, JAMES, MD Primary Cardiologist: Angela GessJonathan J. Tessica Cupo MD Angela Alvarez   HPI:  [name] is a 60 year old  Thin appearing single Caucasian female with no children referred by Angela Alvarez for peripheral vascular evaluation. She was at 45-pack-year history tobacco abuse and a family history of heart disease. She has never had a heart attack or stroke, and denies chest pain. She does get somewhat dyspneic on exertion. She denies claudication. She noticed a purplish and painful left great toe present 4-6 weeks ago which has not gotten any better and was referred here for further evaluation.   Current Outpatient Prescriptions  Medication Sig Dispense Refill  . albuterol (PROVENTIL,VENTOLIN) 90 MCG/ACT inhaler Inhale 2 puffs into the lungs every 4 (four) hours as needed.      . budesonide-formoterol (SYMBICORT) 160-4.5 MCG/ACT inhaler Inhale 2 puffs into the lungs 2 (two) times daily.      . clobetasol cream (TEMOVATE) 0.05 % APPLY TOPICALLY TWICE WEEKLY 30 g 0   No current facility-administered medications for this visit.    Allergies  Allergen Reactions  . Amoxicillin-Pot Clavulanate     NAUSEA AND VOMITING    Social History   Social History  . Marital Status: Single    Spouse Name: N/A  . Number of Children: N/A  . Years of Education: N/A   Occupational History  . Not on file.   Social History Main Topics  . Smoking status: Current Every Day Smoker -- 1.00 packs/day    Types: Cigarettes  . Smokeless tobacco: Not on file  . Alcohol Use: No  . Drug Use: No  . Sexual Activity: No   Other Topics Concern  . Not on file   Social History Narrative     Review of Systems: General: negative for chills, fever, night sweats or weight changes.  Cardiovascular: negative for chest pain, dyspnea on exertion, edema, orthopnea, palpitations, paroxysmal nocturnal dyspnea or shortness of  breath Dermatological: negative for rash Respiratory: negative for cough or wheezing Urologic: negative for hematuria Abdominal: negative for nausea, vomiting, diarrhea, bright red blood per rectum, melena, or hematemesis Neurologic: negative for visual changes, syncope, or dizziness All other systems reviewed and are otherwise negative except as noted above.    Blood pressure 146/86, pulse 92, height 5\' 6"  (1.676 m), weight 177 lb (80.287 kg), last menstrual period 04/05/2004.  General appearance: alert and no distress Neck: no adenopathy, no carotid bruit, no JVD, supple, symmetrical, trachea midline and thyroid not enlarged, symmetric, no tenderness/mass/nodules Lungs: clear to auscultation bilaterally Heart: regular rate and rhythm, S1, S2 normal, no murmur, click, rub or gallop Extremities: extremities normal, atraumatic, no cyanosis or edema and 2+ pedal pulses bilaterally  EKG normal sinus rhythm at 92 with poor R-wave progression. I personally reviewed this EKG  ASSESSMENT AND PLAN:   Critical lower limb ischemia [name] was referred by Angela Alvarez for evaluation of potential critical limb ischemia. She is a 60 year old thin appearing single Caucasian female with no children is chronic risk factors include 50-pack-years tobacco abuse currently smoking one pack per day and family history. She developed a painful purplish discolored left great toe approximately 4-6 weeks ago. His has continued to progress. She denies claudication. She does have a palpable left posterior tibial and dorsalis pedis. I'm going to obtain lower extremity arterial Doppler studies to assess whether this is due to arterial insufficiency.      Angela SeeJonathan J.  Angela Sabal MD [facility], [facility] 04/09/2015 2:27 PM

## 2015-04-10 ENCOUNTER — Other Ambulatory Visit: Payer: Self-pay | Admitting: Cardiovascular Disease

## 2015-04-10 DIAGNOSIS — I739 Peripheral vascular disease, unspecified: Secondary | ICD-10-CM

## 2015-04-17 ENCOUNTER — Telehealth: Payer: Self-pay

## 2015-04-17 ENCOUNTER — Other Ambulatory Visit: Payer: Self-pay | Admitting: Cardiovascular Disease

## 2015-04-17 ENCOUNTER — Ambulatory Visit (HOSPITAL_COMMUNITY)
Admission: RE | Admit: 2015-04-17 | Discharge: 2015-04-17 | Disposition: A | Discharge status: Home / Self Care | Payer: Managed Care, Other (non HMO) | Source: Ambulatory Visit | Attending: Cardiovascular Disease | Admitting: Cardiovascular Disease

## 2015-04-17 DIAGNOSIS — I70203 Unspecified atherosclerosis of native arteries of extremities, bilateral legs: Secondary | ICD-10-CM | POA: Insufficient documentation

## 2015-04-17 DIAGNOSIS — I771 Stricture of artery: Secondary | ICD-10-CM | POA: Insufficient documentation

## 2015-04-17 DIAGNOSIS — L97511 Non-pressure chronic ulcer of other part of right foot limited to breakdown of skin: Secondary | ICD-10-CM | POA: Diagnosis not present

## 2015-04-17 DIAGNOSIS — I739 Peripheral vascular disease, unspecified: Secondary | ICD-10-CM | POA: Insufficient documentation

## 2015-04-17 DIAGNOSIS — R938 Abnormal findings on diagnostic imaging of other specified body structures: Secondary | ICD-10-CM | POA: Diagnosis not present

## 2015-04-17 DIAGNOSIS — I70229 Atherosclerosis of native arteries of extremities with rest pain, unspecified extremity: Secondary | ICD-10-CM

## 2015-04-17 DIAGNOSIS — I998 Other disorder of circulatory system: Secondary | ICD-10-CM

## 2015-04-17 NOTE — Telephone Encounter (Signed)
Pt in the office today for lower extremity dopplers. Pt was very upset as her toe is causing her so much pain.  Pt stated she does not want to go to the emergency room but she is unsure what she can do for the pain. Pt stated she is sensitive to most pain medications as they make her sick but she has tried Tylenol and it has not helped at all.   Pt toes is still purple at the distal tip, as noted in Dr. Hazle CocaBerry's OV on 04/09/15.  Pt stated she is unable to work and rest due to the pain and is very anxious to know what the next steps will be.  Pt educated that this test will help guide Dr. Allyson SabalBerry in the right direction to best treat her toe and hopefully get rid of the pain. Told pt I would send a note to Dr. Allyson SabalBerry to let him know to look for her results and that I will be in contact with her tomorrow (1/13) with an update.  Pt was very appreciative and looking forward hearing the results.

## 2015-04-18 NOTE — Telephone Encounter (Signed)
-----   Message from Runell GessJonathan J Berry, MD sent at 04/18/2015  5:42 AM EST ----- Need formal LEA of RLE

## 2015-04-18 NOTE — Telephone Encounter (Signed)
She needs formal LEA of RLE

## 2015-04-18 NOTE — Progress Notes (Signed)
If we can get the Ao done Monday I'll see on Tues

## 2015-04-18 NOTE — Progress Notes (Signed)
OK 

## 2015-04-22 ENCOUNTER — Encounter: Payer: Self-pay | Admitting: Cardiovascular Disease

## 2015-04-22 ENCOUNTER — Telehealth: Payer: Self-pay

## 2015-04-22 ENCOUNTER — Ambulatory Visit (INDEPENDENT_AMBULATORY_CARE_PROVIDER_SITE_OTHER): Payer: Managed Care, Other (non HMO) | Admitting: Cardiovascular Disease

## 2015-04-22 VITALS — BP 160/92 | HR 108 | Ht 66.0 in | Wt 176.0 lb

## 2015-04-22 DIAGNOSIS — I998 Other disorder of circulatory system: Secondary | ICD-10-CM

## 2015-04-22 DIAGNOSIS — I739 Peripheral vascular disease, unspecified: Secondary | ICD-10-CM

## 2015-04-22 DIAGNOSIS — I70229 Atherosclerosis of native arteries of extremities with rest pain, unspecified extremity: Secondary | ICD-10-CM

## 2015-04-22 NOTE — Patient Instructions (Signed)
Medication Instructions:  Your physician recommends that you continue on your current medications as directed. Please refer to the Current Medication list given to you today.   Labwork: Your physician recommends that you return for lab work in: Today  The lab can be found on the FIRST FLOOR of out building in [address]   Testing/Procedures: Dr. Allyson Sabal has ordered a peripheral angiogram to be done at [facility].  This procedure is going to look at the bloodflow in your lower extremities.  If Dr. Allyson Sabal is able to open up the arteries, you will have to spend one night in the hospital.  If he is not able to open the arteries, you will be able to go home that same day.  SCHEDULE Monday 04/28/15  After the procedure, you will not be allowed to drive for 3 days or push, pull, or lift anything greater than 10 lbs for one week.    You will be required to have the following tests prior to the procedure:  1. Blood work-the blood work can be done no more than 7 days prior to the procedure.  It can be done at any [facility] lab.  There is one downstairs on the first floor of this building and one in the [facility] building [phone], [address])  2. Chest Xray-the chest xray order has already been placed at [facility].     *REPS: Scott  Puncture site Left Groin   Any Other Special Instructions Will Be Listed Below (If Applicable).     If you need a refill on your cardiac medications before your next appointment, please call your pharmacy.

## 2015-04-22 NOTE — Progress Notes (Signed)
Angela Alvarez returns today for follow-up of her Doppler studies. I saw her approximately 2 weeks ago after she was referred by Dr. Stacie Acres , her podiatrist, or a painful purplish discolored right great toe.her Doppler's revealed normal ABIs bilaterally with a high-frequency signal in her mid right SFA. She denies claudication. All tibial vessels were intact. She does have intact pedal pulses. I'm going to perform angiography of her on Monday. The left femoral approach to further characterize her her lower extremity circulation and rule out an ischemic abnormality.

## 2015-04-22 NOTE — Assessment & Plan Note (Signed)
Angela Alvarez returns today for follow-up of her Doppler studies. I saw her approximately 2 weeks ago after she was referred by Dr. Mayer , her podiatrist, or a painful purplish discolored right great toe.her Doppler's revealed normal ABIs bilaterally with a high-frequency signal in her mid right SFA. She denies claudication. All tibial vessels were intact. She does have intact pedal pulses. I'm going to perform angiography of her on Monday. The left femoral approach to further characterize her her lower extremity circulation and rule out an ischemic abnormality. 

## 2015-04-22 NOTE — Telephone Encounter (Signed)
Pt called as she needs OV with Dr. Allyson Sabal. If pt able she can come in to office to see him today 04/22/15, or as soon as possible to discuss next steps for her foot.  Pt has test scheduled on 1/24 that is not needed that needs to be canceled.

## 2015-04-23 ENCOUNTER — Other Ambulatory Visit: Payer: Self-pay | Admitting: Cardiovascular Disease

## 2015-04-23 DIAGNOSIS — I998 Other disorder of circulatory system: Secondary | ICD-10-CM

## 2015-04-23 DIAGNOSIS — I70229 Atherosclerosis of native arteries of extremities with rest pain, unspecified extremity: Secondary | ICD-10-CM

## 2015-04-25 ENCOUNTER — Ambulatory Visit
Admission: RE | Admit: 2015-04-25 | Discharge: 2015-04-25 | Disposition: A | Discharge status: Home / Self Care | Payer: Managed Care, Other (non HMO) | Source: Ambulatory Visit | Attending: Cardiovascular Disease | Admitting: Cardiovascular Disease

## 2015-04-25 DIAGNOSIS — I998 Other disorder of circulatory system: Secondary | ICD-10-CM

## 2015-04-25 DIAGNOSIS — I70229 Atherosclerosis of native arteries of extremities with rest pain, unspecified extremity: Secondary | ICD-10-CM

## 2015-04-25 DIAGNOSIS — I739 Peripheral vascular disease, unspecified: Secondary | ICD-10-CM

## 2015-04-25 LAB — CBC WITH DIFFERENTIAL/PLATELET
BASOS ABS: 0.1 10*3/uL (ref 0.0–0.1)
BASOS PCT: 1 % (ref 0–1)
EOS ABS: 0.1 10*3/uL (ref 0.0–0.7)
Eosinophils Relative: 1 % (ref 0–5)
HCT: 44 % (ref 36.0–46.0)
Hemoglobin: 14.6 g/dL (ref 12.0–15.0)
Lymphocytes Relative: 14 % (ref 12–46)
Lymphs Abs: 1.5 10*3/uL (ref 0.7–4.0)
MCH: 28.7 pg (ref 26.0–34.0)
MCHC: 33.2 g/dL (ref 30.0–36.0)
MCV: 86.4 fL (ref 78.0–100.0)
MPV: 9.6 fL (ref 8.6–12.4)
Monocytes Absolute: 0.6 10*3/uL (ref 0.1–1.0)
Monocytes Relative: 6 % (ref 3–12)
NEUTROS PCT: 78 % — AB (ref 43–77)
Neutro Abs: 8.4 10*3/uL — ABNORMAL HIGH (ref 1.7–7.7)
PLATELETS: 417 10*3/uL — AB (ref 150–400)
RBC: 5.09 MIL/uL (ref 3.87–5.11)
RDW: 13.2 % (ref 11.5–15.5)
WBC: 10.8 10*3/uL — ABNORMAL HIGH (ref 4.0–10.5)

## 2015-04-25 LAB — PROTIME-INR
INR: 1 (ref <1.50)
Prothrombin Time: 13.3 seconds (ref 11.6–15.2)

## 2015-04-25 LAB — APTT: aPTT: 30 seconds (ref 24–37)

## 2015-04-26 LAB — BASIC METABOLIC PANEL
BUN: 10 mg/dL (ref 7–25)
CO2: 24 mmol/L (ref 20–31)
CREATININE: 0.73 mg/dL (ref 0.50–0.99)
Calcium: 8.8 mg/dL (ref 8.6–10.4)
Chloride: 99 mmol/L (ref 98–110)
Glucose, Bld: 140 mg/dL — ABNORMAL HIGH (ref 65–99)
Potassium: 4.4 mmol/L (ref 3.5–5.3)
Sodium: 138 mmol/L (ref 135–146)

## 2015-04-26 LAB — TSH: TSH: 1.547 u[IU]/mL (ref 0.350–4.500)

## 2015-04-27 ENCOUNTER — Other Ambulatory Visit: Payer: Self-pay

## 2015-04-27 DIAGNOSIS — I70229 Atherosclerosis of native arteries of extremities with rest pain, unspecified extremity: Secondary | ICD-10-CM

## 2015-04-27 DIAGNOSIS — I998 Other disorder of circulatory system: Secondary | ICD-10-CM

## 2015-04-28 ENCOUNTER — Ambulatory Visit (HOSPITAL_COMMUNITY)
Admission: RE | Admit: 2015-04-28 | Discharge: 2015-04-28 | Disposition: A | Discharge status: Home / Self Care | Payer: Managed Care, Other (non HMO) | Source: Ambulatory Visit | Attending: Cardiovascular Disease | Admitting: Cardiovascular Disease

## 2015-04-28 ENCOUNTER — Encounter (HOSPITAL_COMMUNITY)
Admission: RE | Disposition: A | Discharge status: Home / Self Care | Payer: Self-pay | Source: Ambulatory Visit | Attending: Cardiovascular Disease

## 2015-04-28 DIAGNOSIS — I700 Atherosclerosis of aorta: Secondary | ICD-10-CM | POA: Diagnosis not present

## 2015-04-28 DIAGNOSIS — I70213 Atherosclerosis of native arteries of extremities with intermittent claudication, bilateral legs: Secondary | ICD-10-CM | POA: Diagnosis not present

## 2015-04-28 DIAGNOSIS — I998 Other disorder of circulatory system: Secondary | ICD-10-CM | POA: Diagnosis present

## 2015-04-28 DIAGNOSIS — I70203 Unspecified atherosclerosis of native arteries of extremities, bilateral legs: Secondary | ICD-10-CM | POA: Diagnosis not present

## 2015-04-28 DIAGNOSIS — I70229 Atherosclerosis of native arteries of extremities with rest pain, unspecified extremity: Secondary | ICD-10-CM | POA: Diagnosis present

## 2015-04-28 HISTORY — PX: PERIPHERAL VASCULAR CATHETERIZATION: SHX172C

## 2015-04-28 SURGERY — LOWER EXTREMITY ANGIOGRAPHY
Anesthesia: LOCAL

## 2015-04-28 MED ORDER — MORPHINE SULFATE (PF) 2 MG/ML IV SOLN
INTRAVENOUS | Status: AC
  Filled 2015-04-28: qty 1

## 2015-04-28 MED ORDER — HYDROMORPHONE HCL 1 MG/ML IJ SOLN
INTRAMUSCULAR | Status: AC
  Filled 2015-04-28: qty 1

## 2015-04-28 MED ORDER — CLOPIDOGREL BISULFATE 75 MG PO TABS: 75.0000 mg | ORAL_TABLET | Freq: Every day | ORAL | Status: DC

## 2015-04-28 MED ORDER — HYDROMORPHONE HCL 1 MG/ML IJ SOLN
0.5000 mg | Freq: Once | INTRAMUSCULAR | Status: AC
  Administered 2015-04-28: 0.5 mg via INTRAVENOUS

## 2015-04-28 MED ORDER — OXYCODONE-ACETAMINOPHEN 2.5-325 MG PO TABS: 1.0000 | ORAL_TABLET | ORAL | Status: DC | PRN

## 2015-04-28 MED ORDER — LIDOCAINE HCL (PF) 1 % IJ SOLN
INTRAMUSCULAR | Status: AC
  Filled 2015-04-28: qty 30

## 2015-04-28 MED ORDER — MIDAZOLAM HCL 2 MG/2ML IJ SOLN
INTRAMUSCULAR | Status: AC
  Filled 2015-04-28: qty 2

## 2015-04-28 MED ORDER — HYDRALAZINE HCL 20 MG/ML IJ SOLN: 10.0000 mg | INTRAMUSCULAR | Status: DC | PRN

## 2015-04-28 MED ORDER — LIDOCAINE HCL (PF) 1 % IJ SOLN
INTRAMUSCULAR | Status: DC | PRN
  Administered 2015-04-28: 14:00:00

## 2015-04-28 MED ORDER — FENTANYL CITRATE (PF) 100 MCG/2ML IJ SOLN
INTRAMUSCULAR | Status: DC | PRN
  Administered 2015-04-28 (×2): 25 ug via INTRAVENOUS

## 2015-04-28 MED ORDER — FENTANYL CITRATE (PF) 100 MCG/2ML IJ SOLN
25.0000 ug | Freq: Once | INTRAMUSCULAR | Status: AC
  Administered 2015-04-28: 25 ug via INTRAVENOUS

## 2015-04-28 MED ORDER — ASPIRIN EC 81 MG PO TBEC: 81.0000 mg | DELAYED_RELEASE_TABLET | Freq: Every day | ORAL | Status: DC

## 2015-04-28 MED ORDER — TRAMADOL HCL 50 MG PO TABS: 50.0000 mg | ORAL_TABLET | Freq: Four times a day (QID) | ORAL | Status: DC | PRN

## 2015-04-28 MED ORDER — SODIUM CHLORIDE 0.9 % IV SOLN: INTRAVENOUS | Status: AC

## 2015-04-28 MED ORDER — SODIUM CHLORIDE 0.9 % IJ SOLN: 3.0000 mL | INTRAMUSCULAR | Status: DC | PRN

## 2015-04-28 MED ORDER — ASPIRIN 81 MG PO CHEW
CHEWABLE_TABLET | ORAL | Status: AC
  Administered 2015-04-28: 81 mg via ORAL
  Filled 2015-04-28: qty 1

## 2015-04-28 MED ORDER — FENTANYL CITRATE (PF) 100 MCG/2ML IJ SOLN
INTRAMUSCULAR | Status: AC
  Filled 2015-04-28: qty 2

## 2015-04-28 MED ORDER — ONDANSETRON HCL 4 MG/2ML IJ SOLN: 4.0000 mg | Freq: Four times a day (QID) | INTRAMUSCULAR | Status: DC | PRN

## 2015-04-28 MED ORDER — MORPHINE SULFATE (PF) 2 MG/ML IV SOLN
2.0000 mg | INTRAVENOUS | Status: DC | PRN
  Administered 2015-04-28: 2 mg via INTRAVENOUS

## 2015-04-28 MED ORDER — SODIUM CHLORIDE 0.9 % WEIGHT BASED INFUSION
3.0000 mL/kg/h | INTRAVENOUS | Status: AC
  Administered 2015-04-28: 3 mL/kg/h via INTRAVENOUS

## 2015-04-28 MED ORDER — FENTANYL CITRATE (PF) 100 MCG/2ML IJ SOLN: 25.0000 ug | INTRAMUSCULAR | Status: DC | PRN

## 2015-04-28 MED ORDER — ASPIRIN 81 MG PO CHEW
81.0000 mg | CHEWABLE_TABLET | ORAL | Status: AC
  Administered 2015-04-28: 81 mg via ORAL

## 2015-04-28 MED ORDER — ACETAMINOPHEN 325 MG PO TABS: 650.0000 mg | ORAL_TABLET | ORAL | Status: DC | PRN

## 2015-04-28 MED ORDER — SODIUM CHLORIDE 0.9 % WEIGHT BASED INFUSION: 1.0000 mL/kg/h | INTRAVENOUS | Status: DC

## 2015-04-28 MED ORDER — HEPARIN (PORCINE) IN NACL 2-0.9 UNIT/ML-% IJ SOLN
INTRAMUSCULAR | Status: AC
  Filled 2015-04-28: qty 1000

## 2015-04-28 MED ORDER — ASPIRIN EC 325 MG PO TBEC: 325.0000 mg | DELAYED_RELEASE_TABLET | Freq: Every day | ORAL | Status: DC

## 2015-04-28 MED ORDER — HYDROCODONE-ACETAMINOPHEN 5-300 MG PO TABS
1.0000 | ORAL_TABLET | ORAL | Status: DC | PRN
Start: 2015-04-28 — End: 2015-04-28

## 2015-04-28 MED ORDER — FENTANYL CITRATE (PF) 100 MCG/2ML IJ SOLN
INTRAMUSCULAR | Status: AC
  Administered 2015-04-28: 25 ug via INTRAVENOUS
  Filled 2015-04-28: qty 2

## 2015-04-28 SURGICAL SUPPLY — 7 items
CATH ANGIO 5F PIGTAIL 65CM (CATHETERS) ×2 IMPLANT
KIT PV (KITS) ×2 IMPLANT
SHEATH PINNACLE 5F 10CM (SHEATH) ×2 IMPLANT
SYRINGE MEDRAD AVANTA MACH 7 (SYRINGE) ×2 IMPLANT
TRANSDUCER W/STOPCOCK (MISCELLANEOUS) ×2 IMPLANT
TRAY PV CATH (CUSTOM PROCEDURE TRAY) ×2 IMPLANT
WIRE HITORQ VERSACORE ST 145CM (WIRE) ×2 IMPLANT

## 2015-04-28 NOTE — Progress Notes (Signed)
Site area: LFA Site Prior to Removal:  Level 0 Pressure Applied For:61min Manual:   yes Patient Status During Pull:  stable Post Pull Site:  Level 0 Post Pull Instructions Given:yes   Post Pull Pulses Present: palpable Dressing Applied:   Bedrest begins @ 1430 till 1830 Comments:

## 2015-04-28 NOTE — Discharge Instructions (Signed)

## 2015-04-28 NOTE — H&P (View-Only) (Signed)
Angela Alvarez returns today for follow-up of her Doppler studies. I saw her approximately 2 weeks ago after she was referred by Dr. Mayer , her podiatrist, or a painful purplish discolored right great toe.her Doppler's revealed normal ABIs bilaterally with a high-frequency signal in her mid right SFA. She denies claudication. All tibial vessels were intact. She does have intact pedal pulses. I'm going to perform angiography of her on Monday. The left femoral approach to further characterize her her lower extremity circulation and rule out an ischemic abnormality. 

## 2015-04-28 NOTE — Interval H&P Note (Signed)
History and Physical Interval Note:  04/28/2015 1:03 PM  Angela Alvarez  has presented today for surgery, with the diagnosis of PVD  The various methods of treatment have been discussed with the patient and family. After consideration of risks, benefits and other options for treatment, the patient has consented to  Procedure(s): Lower Extremity Angiography (N/A) as a surgical intervention .  The patient's history has been reviewed, patient examined, no change in status, stable for surgery.  I have reviewed the patient's chart and labs.  Questions were answered to the patient's satisfaction.     Nanetta Batty

## 2015-04-28 NOTE — Progress Notes (Signed)
The patient can not take Vicodin.  Will give Rx of Tramadol  q 6 hours PRN for pain. #30 No RF.   Yannick Steuber, PAC

## 2015-04-29 ENCOUNTER — Encounter (HOSPITAL_COMMUNITY): Payer: Self-pay | Admitting: Cardiovascular Disease

## 2015-04-29 ENCOUNTER — Inpatient Hospital Stay (HOSPITAL_COMMUNITY): Admission: RE | Admit: 2015-04-29 | Payer: Managed Care, Other (non HMO) | Source: Ambulatory Visit

## 2015-04-29 ENCOUNTER — Telehealth: Payer: Self-pay | Admitting: Cardiovascular Disease

## 2015-04-29 MED ORDER — MIDAZOLAM HCL 2 MG/2ML IJ SOLN
INTRAMUSCULAR | Status: DC | PRN
  Administered 2015-04-28: 2 mg via INTRAVENOUS

## 2015-04-29 MED ORDER — TRAMADOL HCL 50 MG PO TABS: 50.0000 mg | ORAL_TABLET | Freq: Four times a day (QID) | ORAL | Status: DC | PRN

## 2015-04-29 NOTE — Telephone Encounter (Signed)
Spoke with pt, aware script was sent into pharmacy yesterday. She reports the pharmacy did not have it. Script resent.

## 2015-04-29 NOTE — Telephone Encounter (Signed)
Angela Alvarez is calling because she was supposed to have some Tramadol sent to her pharmacy and it was not there when she went to pick it up . She had a procedure on yesterday ( Angiogram) w/ Dr. Allyson Sabal . Please call   Thanks

## 2015-05-06 ENCOUNTER — Encounter: Payer: Managed Care, Other (non HMO) | Admitting: Cardiovascular Disease

## 2015-05-09 ENCOUNTER — Telehealth: Payer: Self-pay | Admitting: Cardiovascular Disease

## 2015-05-09 NOTE — Telephone Encounter (Signed)
Received Attending Provider Statement from [facility] via fax for Dr Allyson Sabal to sign.  No Auth/Pmt.  Sent to [employer] @ [facility] [employer] for letter to be sent to patient for AUTH/Pmt.  Sent to [employer] [facility] [employer] via [employer] 05/09/15. lp

## 2015-05-12 ENCOUNTER — Telehealth: Payer: Self-pay | Admitting: Cardiovascular Disease

## 2015-05-12 NOTE — Telephone Encounter (Signed)
New message      Talk to the nurse regarding the next step regarding her toe problem.  Please call

## 2015-05-12 NOTE — Telephone Encounter (Signed)
Pt calling as she is back in horrible pain and wants to let Dr. Allyson Sabal know that she will probably end up in the emergency room.  She has been taking the Rx Tramadol, which was helping at first but now it is not helping her as much.  The pain has changed and is more intense and not giving any her any relief as of yesterday, and she is unable to rest.  The toe is now black and the side is red and swollen.  Pt is very upset and in a lot of pain and stated she cannot go another night like the last.  Told her I would forward to Dr. Allyson Sabal to make him aware.

## 2015-05-13 ENCOUNTER — Encounter (HOSPITAL_COMMUNITY): Payer: Self-pay | Admitting: Emergency Medicine

## 2015-05-13 ENCOUNTER — Emergency Department (HOSPITAL_COMMUNITY)
Admission: EM | Admit: 2015-05-13 | Discharge: 2015-05-13 | Disposition: A | Discharge status: Home / Self Care | Payer: Managed Care, Other (non HMO) | Attending: Emergency Medicine | Admitting: Emergency Medicine

## 2015-05-13 DIAGNOSIS — Z8679 Personal history of other diseases of the circulatory system: Secondary | ICD-10-CM | POA: Diagnosis not present

## 2015-05-13 DIAGNOSIS — Z7951 Long term (current) use of inhaled steroids: Secondary | ICD-10-CM | POA: Diagnosis not present

## 2015-05-13 DIAGNOSIS — Z880 Allergy status to penicillin: Secondary | ICD-10-CM | POA: Diagnosis not present

## 2015-05-13 DIAGNOSIS — R230 Cyanosis: Secondary | ICD-10-CM | POA: Diagnosis not present

## 2015-05-13 DIAGNOSIS — M79674 Pain in right toe(s): Secondary | ICD-10-CM | POA: Diagnosis not present

## 2015-05-13 DIAGNOSIS — Z79899 Other long term (current) drug therapy: Secondary | ICD-10-CM | POA: Insufficient documentation

## 2015-05-13 DIAGNOSIS — Z7982 Long term (current) use of aspirin: Secondary | ICD-10-CM | POA: Insufficient documentation

## 2015-05-13 DIAGNOSIS — J449 Chronic obstructive pulmonary disease, unspecified: Secondary | ICD-10-CM | POA: Insufficient documentation

## 2015-05-13 DIAGNOSIS — Z7902 Long term (current) use of antithrombotics/antiplatelets: Secondary | ICD-10-CM | POA: Insufficient documentation

## 2015-05-13 DIAGNOSIS — F17210 Nicotine dependence, cigarettes, uncomplicated: Secondary | ICD-10-CM | POA: Diagnosis not present

## 2015-05-13 LAB — CBC WITH DIFFERENTIAL/PLATELET
BASOS ABS: 0.1 10*3/uL (ref 0.0–0.1)
BASOS PCT: 1 %
EOS ABS: 0.3 10*3/uL (ref 0.0–0.7)
Eosinophils Relative: 2 %
HEMATOCRIT: 41 % (ref 36.0–46.0)
HEMOGLOBIN: 13.2 g/dL (ref 12.0–15.0)
Lymphocytes Relative: 19 %
Lymphs Abs: 2.1 10*3/uL (ref 0.7–4.0)
MCH: 27.4 pg (ref 26.0–34.0)
MCHC: 32.2 g/dL (ref 30.0–36.0)
MCV: 85.2 fL (ref 78.0–100.0)
Monocytes Absolute: 0.9 10*3/uL (ref 0.1–1.0)
Monocytes Relative: 8 %
NEUTROS ABS: 7.8 10*3/uL — AB (ref 1.7–7.7)
NEUTROS PCT: 70 %
Platelets: 471 10*3/uL — ABNORMAL HIGH (ref 150–400)
RBC: 4.81 MIL/uL (ref 3.87–5.11)
RDW: 13.2 % (ref 11.5–15.5)
WBC: 11.1 10*3/uL — AB (ref 4.0–10.5)

## 2015-05-13 LAB — BASIC METABOLIC PANEL
ANION GAP: 15 (ref 5–15)
BUN: 6 mg/dL (ref 6–20)
CALCIUM: 9.2 mg/dL (ref 8.9–10.3)
CHLORIDE: 98 mmol/L — AB (ref 101–111)
CO2: 23 mmol/L (ref 22–32)
CREATININE: 0.78 mg/dL (ref 0.44–1.00)
GFR calc non Af Amer: >60 mL/min (ref >60)
Glucose, Bld: 91 mg/dL (ref 65–99)
Potassium: 4 mmol/L (ref 3.5–5.1)
SODIUM: 136 mmol/L (ref 135–145)

## 2015-05-13 LAB — PROTIME-INR
INR: 1.02 (ref 0.00–1.49)
PROTHROMBIN TIME: 13.6 s (ref 11.6–15.2)

## 2015-05-13 MED ORDER — ONDANSETRON HCL 4 MG PO TABS: 4.0000 mg | ORAL_TABLET | Freq: Four times a day (QID) | ORAL | Status: DC

## 2015-05-13 MED ORDER — FENTANYL CITRATE (PF) 100 MCG/2ML IJ SOLN
100.0000 ug | Freq: Once | INTRAMUSCULAR | Status: AC
  Administered 2015-05-13: 100 ug via INTRAVENOUS
  Filled 2015-05-13: qty 2

## 2015-05-13 MED ORDER — HYDROMORPHONE HCL 1 MG/ML IJ SOLN
1.0000 mg | Freq: Once | INTRAMUSCULAR | Status: AC
  Administered 2015-05-13: 1 mg via INTRAVENOUS
  Filled 2015-05-13: qty 1

## 2015-05-13 MED ORDER — HYDROCODONE-ACETAMINOPHEN 5-325 MG PO TABS: 1.0000 | ORAL_TABLET | ORAL | Status: DC | PRN

## 2015-05-13 NOTE — ED Provider Notes (Signed)
CSN: [id number]     Arrival date & time 05/13/15  1358 History   First MD Initiated Contact with Patient 05/13/15 1557     Chief Complaint  Patient presents with  . Toe Pain     (Consider location/radiation/quality/duration/timing/severity/associated sxs/prior Treatment) HPI Enjoli Tidd is a 60 y.o. female history of COPD, peripheral vascular disease and right great toe with ischemia. She reports being seen by Dr. Nanetta Batty and placed on anticoagulation therapy. She also reports that she has been in discussion about a possible amputation of this toe, but wanted to see how the anticoagulation faired. She reports she has not been taking this medication over the past week due to the abdominal cramping that causes. She reports increased pain in her right great toe over the past 2 days not relieved with tramadol. Denies any other fevers, chills, chest pain, unusual call for shortness of breath, numbness or weakness, other leg pain.  Past Medical History  Diagnosis Date  . Asthma   . COPD (chronic obstructive pulmonary disease) (HCC)   . Abnormal Pap smear of cervix 06-01-13    ascus pap:pos.HR HPV/hx of cryotherapy to cx 1985  . Critical lower limb ischemia     ischemic appearing left first toe   Past Surgical History  Procedure Laterality Date  . Bunionectomy      bilateral  . Breast surgery    . Surgery under arm Right     --benign breast tissue  . Peripheral vascular catheterization N/A 04/28/2015    Procedure: Lower Extremity Angiography;  Surgeon: Runell Gess, MD;  Location: [facility] INVASIVE CV LAB;  Service: Cardiovascular;  Laterality: N/A;   Family History  Problem Relation Age of Onset  . Diabetes Neg Hx   . Heart attack Neg Hx   . Hyperlipidemia Neg Hx   . Sudden death Neg Hx   . Stroke Mother     dec age 78  . Hypertension Mother   . Cancer Father     lung age 28  . Cancer Sister     dec colon ca age 24  . Hypertension Sister    Social History  Substance  Use Topics  . Smoking status: Current Every Day Smoker -- 1.00 packs/day    Types: Cigarettes  . Smokeless tobacco: None  . Alcohol Use: No   OB History    Gravida Para Term Preterm AB TAB SAB Ectopic Multiple Living   Review of Systems A 10 point review of systems was completed and was negative except for pertinent positives and negatives as mentioned in the history of present illness     Allergies  Amoxicillin-pot clavulanate and Levofloxacin  Home Medications   Prior to Admission medications   Medication Sig Start Date End Date Taking? Authorizing Provider  albuterol (PROVENTIL,VENTOLIN) 90 MCG/ACT inhaler Inhale 2 puffs into the lungs every 4 (four) hours as needed for wheezing or shortness of breath.    Yes Historical Provider, MD  budesonide-formoterol (SYMBICORT) 160-4.5 MCG/ACT inhaler Inhale 2 puffs into the lungs 2 (two) times daily.     Yes Historical Provider, MD  clobetasol cream (TEMOVATE) 0.05 % APPLY TOPICALLY TWICE WEEKLY 05/24/14  Yes Patton Salles, MD  clopidogrel (PLAVIX) 75 MG tablet Take 1 tablet (75 mg total) by mouth daily with breakfast. 04/29/15  Yes Brittainy Sherlynn Carbon, PA-C  ibuprofen (ADVIL,MOTRIN) 400 MG tablet Take 400 mg by mouth every  6 (six) hours as needed for mild pain or moderate pain.   Yes Historical Provider, MD  traMADol (ULTRAM) 50 MG tablet Take 1 tablet (50 mg total) by mouth every 6 (six) hours as needed for severe pain. 04/29/15  Yes Runell Gess, MD  aspirin 325 MG tablet Take 325 mg by mouth daily.    Historical Provider, MD  HYDROcodone-acetaminophen (NORCO/VICODIN) 5-325 MG tablet Take 1-2 tablets by mouth every 4 (four) hours as needed. 05/13/15   Joycie Peek, PA-C  ondansetron (ZOFRAN) 4 MG tablet Take 1 tablet (4 mg total) by mouth every 6 (six) hours. 05/13/15   Vearl Allbaugh, PA-C   BP 156/90 mmHg  Pulse 101  Temp(Src) 98.1 F (36.7 C) (Oral)  Resp 16  Ht 5\' 6"  (1.676 m)  Wt 79.379 kg  BMI  28.26 kg/m2  SpO2 98%  LMP 04/05/2004 Physical Exam  Constitutional: She is oriented to person, place, and time. She appears well-developed and well-nourished.  HENT:  Head: Normocephalic and atraumatic.  Mouth/Throat: Oropharynx is clear and moist.  Eyes: Conjunctivae are normal. Pupils are equal, round, and reactive to light. Right eye exhibits no discharge. Left eye exhibits no discharge. No scleral icterus.  Neck: Neck supple.  Cardiovascular: Normal rate, regular rhythm and normal heart sounds.   Pulmonary/Chest: Effort normal and breath sounds normal. No respiratory distress. She has no wheezes. She has no rales.  Abdominal: Soft. There is no tenderness.  Musculoskeletal: She exhibits no tenderness.  Neurological: She is alert and oriented to person, place, and time.  Cranial Nerves II-XII grossly intact  Skin: Skin is warm and dry. No rash noted.  Right great toe appears moderately necrotic at the distal aspect. See picture for further clarification. Unable to palpate dorsalis pedis, absent on Doppler. Good posterior tibialis pulses identified with Doppler.  Psychiatric: She has a normal mood and affect.  Nursing note and vitals reviewed.       ED Course  Procedures (including critical care time) Labs Review Labs Reviewed  BASIC METABOLIC PANEL - Abnormal; Notable for the following:    Chloride 98 (*)    All other components within normal limits  CBC WITH DIFFERENTIAL/PLATELET - Abnormal; Notable for the following:    WBC 11.1 (*)    Platelets 471 (*)    Neutro Abs 7.8 (*)    All other components within normal limits  PROTIME-INR    Imaging Review No results found. I have personally reviewed and evaluated these images and lab results as part of my medical decision-making.   EKG Interpretation None     Filed Vitals:   05/13/15 1406 05/13/15 1417 05/13/15 1603  BP: 143/88 111/64 156/90  Pulse: 104 78 101  Temp: 98.4 F (36.9 C) 98.1 F (36.7 C)   TempSrc:  Oral Oral   Resp: 18 16 16   Height: 5\' 6"  (1.676 m)    Weight: 79.379 kg    SpO2: 96% 100% 98%    MDM  Lezly Rumpf is a 60 y.o. female here for evaluation of right toe pain secondary to ischemia confirmed by angiography study completed on 04/28/15.Marland Kitchen Patient has been evaluated for this problem by cardiology, Dr. Allyson Sabal and started on anticoagulation therapy. She was also referred to vascular surgery, Dr. Earnie Larsson at [facility]. She presents today for worsening toe pain over the past 2 days. Has not taken anticoagulation, Plavix over the past one week. Right great toe is blue in appearance. No DP pulse. Posterior tib identified  with good pulses. Plan to consult vascular surgery. Discussed with Dr. Darrick Penna, will see in the ED. Patient to have amputation on Monday. We'll provide pain medicine in the interim. Prior to patient discharge, I discussed and reviewed this case with Dr. Denton Lank  Final diagnoses:  Toe pain, right        Joycie Peek, PA-C 05/13/15 1832  Cathren Laine, MD 05/14/15 [phone]

## 2015-05-13 NOTE — Consult Note (Signed)
[facility] [place] HISTORY AND PHYSICAL   Referring Physician: Joycie Peek, PA-C History of Present Illness:  Angela Alvarez is a 60 y.o. female who presents for evaluation of painful right first toe.  The Angela Alvarez noticed the toe turning dark several weeks ago.  She underwent arteriogram by Dr Berry1/23/17 which showed mobile plaque in the distal abdominal aorta as well as right mid SFA stenosis and occlusion of AT.  She also had occlusion of the PT and mid SFA stenosis on the contralateral side.  She was started on Plavix but has not taken it due to GI upset.  She states she can take aspirin. She denies claudication.  She has been on Tramadol for the pain but it is no longer working.  She says she is trying to quit smoking but has not started the Chantix she was prescribed a few days ago.  She denies fever or chills. She also saw Dr Wray Kearns at [facility] for a second opinion earlier this week wo agreed with a trial of antiplatelet therapy but was not optimistic the toe would survive.  Other medical problems include COPD but this has been stable.  Past Medical History  Diagnosis Date  . Asthma   . COPD (chronic obstructive pulmonary disease) (HCC)   . Abnormal Pap smear of cervix 06-01-13    ascus pap:pos.HR HPV/hx of cryotherapy to cx 1985  . Critical lower limb ischemia     ischemic appearing left first toe    Past Surgical History  Procedure Laterality Date  . Bunionectomy      bilateral  . Breast surgery    . Surgery under arm Right     --benign breast tissue  . Peripheral vascular catheterization N/A 04/28/2015    Procedure: Lower Extremity Angiography;  Surgeon: Runell Gess, MD;  Location: [facility] INVASIVE CV LAB;  Service: Cardiovascular;  Laterality: N/A;    Social History Social History  Substance Use Topics  . Smoking status: Current Every Day Smoker -- 1.00 packs/day    Types: Cigarettes  . Smokeless tobacco: None  . Alcohol Use: No    Family  History Family History  Problem Relation Age of Onset  . Diabetes Neg Hx   . Heart attack Neg Hx   . Hyperlipidemia Neg Hx   . Sudden death Neg Hx   . Stroke Mother     dec age 43  . Hypertension Mother   . Cancer Father     lung age 1  . Cancer Sister     dec colon ca age 55  . Hypertension Sister     Allergies  Allergies  Allergen Reactions  . Amoxicillin-Pot Clavulanate Nausea And Vomiting    NAUSEA AND VOMITING  . Levofloxacin Nausea And Vomiting     Current Facility-Administered Medications  Medication Dose Route Frequency Provider Last Rate Last Dose  . HYDROmorphone (DILAUDID) injection 1 mg  1 mg Intravenous Once Joycie Peek, PA-C       Current Outpatient Prescriptions  Medication Sig Dispense Refill  . albuterol (PROVENTIL,VENTOLIN) 90 MCG/ACT inhaler Inhale 2 puffs into the lungs every 4 (four) hours as needed for wheezing or shortness of breath.     . budesonide-formoterol (SYMBICORT) 160-4.5 MCG/ACT inhaler Inhale 2 puffs into the lungs 2 (two) times daily.      . clobetasol cream (TEMOVATE) 0.05 % APPLY TOPICALLY TWICE WEEKLY 30 g 0  . clopidogrel (PLAVIX) 75 MG tablet Take 1 tablet (75 mg total) by mouth daily  with breakfast. 30 tablet 11  . ibuprofen (ADVIL,MOTRIN) 400 MG tablet Take 400 mg by mouth every 6 (six) hours as needed for mild pain or moderate pain.    . traMADol (ULTRAM) 50 MG tablet Take 1 tablet (50 mg total) by mouth every 6 (six) hours as needed for severe pain. 30 tablet 0  . aspirin 325 MG tablet Take 325 mg by mouth daily.      ROS:   General:  No weight loss, Fever, chills  HEENT: No recent headaches, no nasal bleeding, no visual changes, no sore throat  Neurologic: No dizziness, blackouts, seizures. No recent symptoms of stroke or mini- stroke. No recent episodes of slurred speech, or temporary blindness.  Cardiac: No recent episodes of chest pain/pressure, no shortness of breath at rest.  + shortness of breath with exertion.   Denies history of atrial fibrillation or irregular heartbeat  Vascular: No history of rest pain in feet.  No history of claudication.  No history of non-healing ulcer, No history of DVT   Pulmonary: No home oxygen, no productive cough, no hemoptysis,  No asthma or wheezing  Musculoskeletal:  [ ]  Arthritis, [ ]  Low back pain,  [x ] Joint pain  Hematologic:No history of hypercoagulable state.  No history of easy bleeding.  No history of anemia  Gastrointestinal: No hematochezia or melena,  No gastroesophageal reflux, no trouble swallowing  Urinary: [ ]  chronic Kidney disease, [ ]  on HD - [ ]  MWF or [ ]  TTHS, [ ]  Burning with urination, [ ]  Frequent urination, [ ]  Difficulty urinating;   Skin: No rashes  Psychological: No history of anxiety,  No history of depression   Physical Examination  Filed Vitals:   05/13/15 1406 05/13/15 1417 05/13/15 1603  BP: 143/88 111/64 156/90  Pulse: 104 78 101  Temp: 98.4 F (36.9 C) 98.1 F (36.7 C)   TempSrc: Oral Oral   Resp: 18 16 16   Height: 5\' 6"  (1.676 m)    Weight: 175 lb (79.379 kg)    SpO2: 96% 100% 98%    Body mass index is 28.26 kg/(m^2).  General:  Alert and oriented, no acute distress HEENT: Normal Neck: No JVD Pulmonary: Clear to auscultation bilaterally Cardiac: Regular Rate and Rhythm Abdomen: Soft, non-tender, non-distended Skin: No rash, right first toe dusky to the PIP joint with some erythema over the metatarsal head Extremity Pulses:  2+ radial, brachial, femoral, absent right dorsalis pedis, 2+ right posterior tibial pulses, 2+ right DP, absent left PT Musculoskeletal: No deformity or edema  Neurologic: Upper and lower extremity motor 5/5 and symmetric  DATA:  agram images reviewed as well as notes from Dr Allyson Sabal and Dr Randa Evens findings as per HPI  CBC    Component Value Date/Time   WBC 11.1* 05/13/2015 1625   RBC 4.81 05/13/2015 1625   HGB 13.2 05/13/2015 1625   HCT 41.0 05/13/2015 1625   PLT 471* 05/13/2015  1625   MCV 85.2 05/13/2015 1625   MCH 27.4 05/13/2015 1625   MCHC 32.2 05/13/2015 1625   RDW 13.2 05/13/2015 1625   LYMPHSABS 2.1 05/13/2015 1625   MONOABS 0.9 05/13/2015 1625   EOSABS 0.3 05/13/2015 1625   BASOSABS 0.1 05/13/2015 1625     BMET    Component Value Date/Time   NA 136 05/13/2015 1625   K 4.0 05/13/2015 1625   CL 98* 05/13/2015 1625   CO2 23 05/13/2015 1625   GLUCOSE 91 05/13/2015 1625   BUN 6 05/13/2015 1625  CREATININE 0.78 05/13/2015 1625   CREATININE 0.73 04/25/2015 1128   CALCIUM 9.2 05/13/2015 1625   GFRNONAA >60 05/13/2015 1625   GFRAA >60 05/13/2015 1625    ASSESSMENT:  Infarcted right first toe with pain.  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  Right first toe amputation on Monday 05/19/15  2.  Encouraged pt to at least take aspirin daily if she could not take plavix.  3.  Pain med until Monday per ER.  Will need consideration for aortic endarterectomy if fails antiplatelet therapy  Fabienne Bruns, MD [facility] [place] Office: [phone] Pager: [phone]

## 2015-05-13 NOTE — ED Notes (Signed)
Pt verbalized understanding of d/c instructions, prescriptions, and follow-up care. No further questions/concerns, VSS, ambulatory w/ steady gait (refused wheelchair) 

## 2015-05-13 NOTE — ED Notes (Signed)
Per EMS: pt c/o pain in great right toe with discoloration; pt sts hx of blockage in toe and may have to have amputated; pt sts increased pain x 8 weeks

## 2015-05-13 NOTE — Discharge Instructions (Signed)
You were evaluated in the ED today for your right toe pain. It is important for you to follow-up with vascular surgery, Dr. Darrick Penna, on Monday for your toe amputation. Take your pain medicine as prescribed and as we discussed. He may also take your nausea medicine with the pain medicine to help avoid upset stomach. Return to ED for any new or worsening symptoms as we discussed

## 2015-05-15 ENCOUNTER — Other Ambulatory Visit: Payer: Self-pay

## 2015-05-16 ENCOUNTER — Encounter (HOSPITAL_COMMUNITY): Payer: Self-pay | Admitting: *Deleted

## 2015-05-16 MED ORDER — VANCOMYCIN HCL IN DEXTROSE 1-5 GM/200ML-% IV SOLN
1000.0000 mg | INTRAVENOUS | Status: AC
  Administered 2015-05-19: 1000 mg via INTRAVENOUS
  Filled 2015-05-16: qty 200

## 2015-05-16 MED ORDER — SODIUM CHLORIDE 0.9 % IV SOLN: INTRAVENOUS | Status: DC

## 2015-05-16 MED ORDER — CHLORHEXIDINE GLUCONATE CLOTH 2 % EX PADS: 6.0000 | MEDICATED_PAD | Freq: Once | CUTANEOUS | Status: DC

## 2015-05-19 ENCOUNTER — Encounter (HOSPITAL_COMMUNITY)
Admission: RE | Disposition: A | Discharge status: Home / Self Care | Payer: Self-pay | Source: Ambulatory Visit | Attending: Vascular Surgery

## 2015-05-19 ENCOUNTER — Ambulatory Visit (HOSPITAL_COMMUNITY): Payer: Managed Care, Other (non HMO) | Admitting: Certified Registered Nurse Anesthetist

## 2015-05-19 ENCOUNTER — Encounter (HOSPITAL_COMMUNITY): Payer: Self-pay

## 2015-05-19 ENCOUNTER — Inpatient Hospital Stay (HOSPITAL_COMMUNITY)
Admission: RE | Admit: 2015-05-19 | Discharge: 2015-05-20 | DRG: 257 | Disposition: A | Discharge status: Home / Self Care | Payer: Managed Care, Other (non HMO) | Source: Ambulatory Visit | Attending: Vascular Surgery | Admitting: Vascular Surgery

## 2015-05-19 DIAGNOSIS — I739 Peripheral vascular disease, unspecified: Secondary | ICD-10-CM | POA: Diagnosis present

## 2015-05-19 DIAGNOSIS — Z7902 Long term (current) use of antithrombotics/antiplatelets: Secondary | ICD-10-CM | POA: Diagnosis not present

## 2015-05-19 DIAGNOSIS — I96 Gangrene, not elsewhere classified: Secondary | ICD-10-CM

## 2015-05-19 DIAGNOSIS — F17210 Nicotine dependence, cigarettes, uncomplicated: Secondary | ICD-10-CM | POA: Diagnosis present

## 2015-05-19 DIAGNOSIS — M79671 Pain in right foot: Secondary | ICD-10-CM

## 2015-05-19 DIAGNOSIS — J449 Chronic obstructive pulmonary disease, unspecified: Secondary | ICD-10-CM | POA: Diagnosis present

## 2015-05-19 DIAGNOSIS — I70269 Atherosclerosis of native arteries of extremities with gangrene, unspecified extremity: Secondary | ICD-10-CM | POA: Diagnosis present

## 2015-05-19 DIAGNOSIS — J45909 Unspecified asthma, uncomplicated: Secondary | ICD-10-CM | POA: Diagnosis present

## 2015-05-19 DIAGNOSIS — I70261 Atherosclerosis of native arteries of extremities with gangrene, right leg: Principal | ICD-10-CM | POA: Diagnosis present

## 2015-05-19 HISTORY — PX: AMPUTATION: SHX166

## 2015-05-19 HISTORY — DX: Peripheral vascular disease, unspecified: I73.9

## 2015-05-19 LAB — POCT I-STAT 4, (NA,K, GLUC, HGB,HCT)
GLUCOSE: 111 mg/dL — AB (ref 65–99)
HEMATOCRIT: 44 % (ref 36.0–46.0)
Hemoglobin: 15 g/dL (ref 12.0–15.0)
POTASSIUM: 4 mmol/L (ref 3.5–5.1)
SODIUM: 138 mmol/L (ref 135–145)

## 2015-05-19 LAB — CBC
HCT: 40.2 % (ref 36.0–46.0)
Hemoglobin: 12.9 g/dL (ref 12.0–15.0)
MCH: 27.8 pg (ref 26.0–34.0)
MCHC: 32.1 g/dL (ref 30.0–36.0)
MCV: 86.6 fL (ref 78.0–100.0)
Platelets: 410 K/uL — ABNORMAL HIGH (ref 150–400)
RBC: 4.64 MIL/uL (ref 3.87–5.11)
RDW: 13.7 % (ref 11.5–15.5)
WBC: 8.5 K/uL (ref 4.0–10.5)

## 2015-05-19 LAB — CREATININE, SERUM: CREATININE: 0.7 mg/dL (ref 0.44–1.00)

## 2015-05-19 SURGERY — AMPUTATION, FOOT, RAY
Anesthesia: General | Site: Toe | Laterality: Right

## 2015-05-19 MED ORDER — ALBUTEROL SULFATE (2.5 MG/3ML) 0.083% IN NEBU: 2.5000 mg | INHALATION_SOLUTION | RESPIRATORY_TRACT | Status: DC | PRN

## 2015-05-19 MED ORDER — 0.9 % SODIUM CHLORIDE (POUR BTL) OPTIME
TOPICAL | Status: DC | PRN
  Administered 2015-05-19: 1000 mL

## 2015-05-19 MED ORDER — FENTANYL CITRATE (PF) 250 MCG/5ML IJ SOLN
INTRAMUSCULAR | Status: AC
  Filled 2015-05-19: qty 5

## 2015-05-19 MED ORDER — DOCUSATE SODIUM 100 MG PO CAPS
100.0000 mg | ORAL_CAPSULE | Freq: Every day | ORAL | Status: DC
  Filled 2015-05-19: qty 1

## 2015-05-19 MED ORDER — METOPROLOL TARTRATE 1 MG/ML IV SOLN: 2.0000 mg | INTRAVENOUS | Status: DC | PRN

## 2015-05-19 MED ORDER — HYDROMORPHONE HCL 1 MG/ML IJ SOLN
INTRAMUSCULAR | Status: AC
  Administered 2015-05-19: 0.5 mg via INTRAVENOUS
  Filled 2015-05-19: qty 1

## 2015-05-19 MED ORDER — ONDANSETRON HCL 4 MG PO TABS: 4.0000 mg | ORAL_TABLET | Freq: Four times a day (QID) | ORAL | Status: DC

## 2015-05-19 MED ORDER — MAGNESIUM SULFATE 2 GM/50ML IV SOLN
2.0000 g | Freq: Every day | INTRAVENOUS | Status: DC | PRN
  Filled 2015-05-19: qty 50

## 2015-05-19 MED ORDER — PHENOL 1.4 % MT LIQD: 1.0000 | OROMUCOSAL | Status: DC | PRN

## 2015-05-19 MED ORDER — ASPIRIN 325 MG PO TABS: 325.0000 mg | ORAL_TABLET | Freq: Every day | ORAL | Status: DC

## 2015-05-19 MED ORDER — HYDROMORPHONE HCL 1 MG/ML IJ SOLN
0.2500 mg | INTRAMUSCULAR | Status: DC | PRN
  Administered 2015-05-19 (×3): 0.5 mg via INTRAVENOUS

## 2015-05-19 MED ORDER — LIDOCAINE HCL (CARDIAC) 20 MG/ML IV SOLN
INTRAVENOUS | Status: DC | PRN
  Administered 2015-05-19: 80 mg via INTRAVENOUS

## 2015-05-19 MED ORDER — MIDAZOLAM HCL 2 MG/2ML IJ SOLN
INTRAMUSCULAR | Status: AC
  Filled 2015-05-19: qty 2

## 2015-05-19 MED ORDER — POTASSIUM CHLORIDE CRYS ER 20 MEQ PO TBCR: 20.0000 meq | EXTENDED_RELEASE_TABLET | Freq: Every day | ORAL | Status: DC | PRN

## 2015-05-19 MED ORDER — HYDRALAZINE HCL 20 MG/ML IJ SOLN: 5.0000 mg | INTRAMUSCULAR | Status: DC | PRN

## 2015-05-19 MED ORDER — LABETALOL HCL 5 MG/ML IV SOLN: 10.0000 mg | INTRAVENOUS | Status: DC | PRN

## 2015-05-19 MED ORDER — LACTATED RINGERS IV SOLN
INTRAVENOUS | Status: DC
  Administered 2015-05-19: 12:00:00 via INTRAVENOUS

## 2015-05-19 MED ORDER — FENTANYL CITRATE (PF) 100 MCG/2ML IJ SOLN
INTRAMUSCULAR | Status: DC | PRN
  Administered 2015-05-19 (×5): 50 ug via INTRAVENOUS

## 2015-05-19 MED ORDER — MORPHINE SULFATE (PF) 2 MG/ML IV SOLN
2.0000 mg | INTRAVENOUS | Status: DC | PRN
  Administered 2015-05-19 – 2015-05-20 (×3): 2 mg via INTRAVENOUS
  Filled 2015-05-19 (×3): qty 1

## 2015-05-19 MED ORDER — FENTANYL CITRATE (PF) 100 MCG/2ML IJ SOLN
INTRAMUSCULAR | Status: AC
  Filled 2015-05-19: qty 2

## 2015-05-19 MED ORDER — PROMETHAZINE HCL 25 MG/ML IJ SOLN: 6.2500 mg | INTRAMUSCULAR | Status: DC | PRN

## 2015-05-19 MED ORDER — KETOROLAC TROMETHAMINE 30 MG/ML IJ SOLN
INTRAMUSCULAR | Status: AC
  Administered 2015-05-19: 30 mg via INTRAVENOUS
  Filled 2015-05-19: qty 1

## 2015-05-19 MED ORDER — ALUM & MAG HYDROXIDE-SIMETH 200-200-20 MG/5ML PO SUSP: 15.0000 mL | ORAL | Status: DC | PRN

## 2015-05-19 MED ORDER — HYDROMORPHONE HCL 1 MG/ML IJ SOLN
INTRAMUSCULAR | Status: AC
  Filled 2015-05-19: qty 1

## 2015-05-19 MED ORDER — HYDROCODONE-ACETAMINOPHEN 5-325 MG PO TABS: 1.0000 | ORAL_TABLET | ORAL | Status: DC | PRN

## 2015-05-19 MED ORDER — BUDESONIDE-FORMOTEROL FUMARATE 160-4.5 MCG/ACT IN AERO
2.0000 | INHALATION_SPRAY | Freq: Two times a day (BID) | RESPIRATORY_TRACT | Status: DC
  Administered 2015-05-19 – 2015-05-20 (×2): 2 via RESPIRATORY_TRACT
  Filled 2015-05-19: qty 6

## 2015-05-19 MED ORDER — PROPOFOL 10 MG/ML IV BOLUS
INTRAVENOUS | Status: DC | PRN
  Administered 2015-05-19: 160 mg via INTRAVENOUS
  Administered 2015-05-19: 30 mg via INTRAVENOUS

## 2015-05-19 MED ORDER — ALBUTEROL 90 MCG/ACT IN AERS: 2.0000 | INHALATION_SPRAY | RESPIRATORY_TRACT | Status: DC | PRN

## 2015-05-19 MED ORDER — PANTOPRAZOLE SODIUM 40 MG PO TBEC: 40.0000 mg | DELAYED_RELEASE_TABLET | Freq: Every day | ORAL | Status: DC

## 2015-05-19 MED ORDER — SODIUM CHLORIDE 0.9 % IV SOLN: 500.0000 mL | Freq: Once | INTRAVENOUS | Status: DC | PRN

## 2015-05-19 MED ORDER — TRAMADOL HCL 50 MG PO TABS: 50.0000 mg | ORAL_TABLET | Freq: Four times a day (QID) | ORAL | Status: DC | PRN

## 2015-05-19 MED ORDER — PROPOFOL 10 MG/ML IV BOLUS
INTRAVENOUS | Status: AC
  Filled 2015-05-19: qty 20

## 2015-05-19 MED ORDER — KETOROLAC TROMETHAMINE 30 MG/ML IJ SOLN
30.0000 mg | Freq: Once | INTRAMUSCULAR | Status: AC
  Administered 2015-05-19: 30 mg via INTRAVENOUS

## 2015-05-19 MED ORDER — ONDANSETRON HCL 4 MG/2ML IJ SOLN
INTRAMUSCULAR | Status: DC | PRN
  Administered 2015-05-19: 4 mg via INTRAVENOUS

## 2015-05-19 MED ORDER — ARTIFICIAL TEARS OP OINT
TOPICAL_OINTMENT | OPHTHALMIC | Status: DC | PRN
  Administered 2015-05-19: 1 via OPHTHALMIC

## 2015-05-19 MED ORDER — ACETAMINOPHEN 650 MG RE SUPP: 325.0000 mg | RECTAL | Status: DC | PRN

## 2015-05-19 MED ORDER — CLOBETASOL PROPIONATE 0.05 % EX CREA
TOPICAL_CREAM | Freq: Two times a day (BID) | CUTANEOUS | Status: DC
  Filled 2015-05-19: qty 15

## 2015-05-19 MED ORDER — MORPHINE SULFATE (PF) 4 MG/ML IV SOLN
INTRAVENOUS | Status: AC
  Filled 2015-05-19: qty 1

## 2015-05-19 MED ORDER — MORPHINE SULFATE (PF) 4 MG/ML IV SOLN
4.0000 mg | Freq: Once | INTRAVENOUS | Status: AC
  Administered 2015-05-19: 4 mg via INTRAVENOUS

## 2015-05-19 MED ORDER — ACETAMINOPHEN 325 MG PO TABS: 325.0000 mg | ORAL_TABLET | ORAL | Status: DC | PRN

## 2015-05-19 MED ORDER — MIDAZOLAM HCL 5 MG/5ML IJ SOLN
INTRAMUSCULAR | Status: DC | PRN
  Administered 2015-05-19: 2 mg via INTRAVENOUS

## 2015-05-19 MED ORDER — IBUPROFEN 200 MG PO TABS: 400.0000 mg | ORAL_TABLET | Freq: Four times a day (QID) | ORAL | Status: DC | PRN

## 2015-05-19 MED ORDER — ONDANSETRON HCL 4 MG/2ML IJ SOLN
4.0000 mg | Freq: Four times a day (QID) | INTRAMUSCULAR | Status: DC | PRN
  Administered 2015-05-20 (×2): 4 mg via INTRAVENOUS
  Filled 2015-05-19 (×2): qty 2

## 2015-05-19 MED ORDER — ENOXAPARIN SODIUM 40 MG/0.4ML ~~LOC~~ SOLN: 40.0000 mg | SUBCUTANEOUS | Status: DC

## 2015-05-19 MED ORDER — HYDROCODONE-ACETAMINOPHEN 5-325 MG PO TABS
1.0000 | ORAL_TABLET | ORAL | Status: DC | PRN
  Administered 2015-05-20 (×2): 2 via ORAL
  Filled 2015-05-19 (×2): qty 2

## 2015-05-19 SURGICAL SUPPLY — 26 items
BANDAGE ELASTIC 4 VELCRO ST LF (GAUZE/BANDAGES/DRESSINGS) ×2 IMPLANT
BNDG GAUZE ELAST 4 BULKY (GAUZE/BANDAGES/DRESSINGS) ×2 IMPLANT
CANISTER SUCTION 2500CC (MISCELLANEOUS) ×2 IMPLANT
COVER SURGICAL LIGHT HANDLE (MISCELLANEOUS) ×2 IMPLANT
DRAPE EXTREMITY T 121X128X90 (DRAPE) ×2 IMPLANT
DRSG EMULSION OIL 3X3 NADH (GAUZE/BANDAGES/DRESSINGS) ×2 IMPLANT
ELECT REM PT RETURN 9FT ADLT (ELECTROSURGICAL) ×2
ELECTRODE REM PT RTRN 9FT ADLT (ELECTROSURGICAL) ×1 IMPLANT
GAUZE SPONGE 4X4 12PLY STRL (GAUZE/BANDAGES/DRESSINGS) ×2 IMPLANT
GLOVE BIO SURGEON STRL SZ7.5 (GLOVE) ×2 IMPLANT
GOWN STRL REUS W/ TWL LRG LVL3 (GOWN DISPOSABLE) ×3 IMPLANT
GOWN STRL REUS W/TWL LRG LVL3 (GOWN DISPOSABLE) ×3
KIT BASIN OR (CUSTOM PROCEDURE TRAY) ×2 IMPLANT
KIT ROOM TURNOVER OR (KITS) ×2 IMPLANT
NS IRRIG 1000ML POUR BTL (IV SOLUTION) ×2 IMPLANT
PACK GENERAL/GYN (CUSTOM PROCEDURE TRAY) ×2 IMPLANT
PAD ARMBOARD 7.5X6 YLW CONV (MISCELLANEOUS) ×4 IMPLANT
SPECIMEN JAR SMALL (MISCELLANEOUS) ×2 IMPLANT
SPONGE GAUZE 4X4 12PLY STER LF (GAUZE/BANDAGES/DRESSINGS) ×2 IMPLANT
SUT ETHILON 3 0 PS 1 (SUTURE) ×2 IMPLANT
SUT VIC AB 3-0 SH 27 (SUTURE) ×1
SUT VIC AB 3-0 SH 27X BRD (SUTURE) ×1 IMPLANT
SWAB COLLECTION DEVICE MRSA (MISCELLANEOUS) IMPLANT
TUBE ANAEROBIC SPECIMEN COL (MISCELLANEOUS) IMPLANT
UNDERPAD 30X30 INCONTINENT (UNDERPADS AND DIAPERS) ×2 IMPLANT
WATER STERILE IRR 1000ML POUR (IV SOLUTION) ×2 IMPLANT

## 2015-05-19 NOTE — Interval H&P Note (Signed)
History and Physical Interval Note:  05/19/2015 2:12 PM  Angela Alvarez  has presented today for surgery, with the diagnosis of Infarcted Right First Toe  M62.89 Pain Right First Toe  M79.674  The various methods of treatment have been discussed with the patient and family. After consideration of risks, benefits and other options for treatment, the patient has consented to  Procedure(s): AMPUTATION RAY- right 1st toe (Right) as a surgical intervention .  The patient's history has been reviewed, patient examined, no change in status, stable for surgery.  I have reviewed the patient's chart and labs.  Questions were answered to the patient's satisfaction.     Fabienne Bruns

## 2015-05-19 NOTE — H&P (View-Only) (Signed)
[facility] [place] HISTORY AND PHYSICAL   Referring Physician: Joycie Peek, PA-C History of Present Illness:  Patient is a 60 y.o. female who presents for evaluation of painful right first toe.  The patient noticed the toe turning dark several weeks ago.  She underwent arteriogram by Dr Berry1/23/17 which showed mobile plaque in the distal abdominal aorta as well as right mid SFA stenosis and occlusion of AT.  She also had occlusion of the PT and mid SFA stenosis on the contralateral side.  She was started on Plavix but has not taken it due to GI upset.  She states she can take aspirin. She denies claudication.  She has been on Tramadol for the pain but it is no longer working.  She says she is trying to quit smoking but has not started the Chantix she was prescribed a few days ago.  She denies fever or chills. She also saw Dr Wray Kearns at [facility] for a second opinion earlier this week wo agreed with a trial of antiplatelet therapy but was not optimistic the toe would survive.  Other medical problems include COPD but this has been stable.  Past Medical History  Diagnosis Date  . Asthma   . COPD (chronic obstructive pulmonary disease) (HCC)   . Abnormal Pap smear of cervix 06-01-13    ascus pap:pos.HR HPV/hx of cryotherapy to cx 1985  . Critical lower limb ischemia     ischemic appearing left first toe    Past Surgical History  Procedure Laterality Date  . Bunionectomy      bilateral  . Breast surgery    . Surgery under arm Right     --benign breast tissue  . Peripheral vascular catheterization N/A 04/28/2015    Procedure: Lower Extremity Angiography;  Surgeon: Runell Gess, MD;  Location: [facility] INVASIVE CV LAB;  Service: Cardiovascular;  Laterality: N/A;    Social History Social History  Substance Use Topics  . Smoking status: Current Every Day Smoker -- 1.00 packs/day    Types: Cigarettes  . Smokeless tobacco: None  . Alcohol Use: No    Family  History Family History  Problem Relation Age of Onset  . Diabetes Neg Hx   . Heart attack Neg Hx   . Hyperlipidemia Neg Hx   . Sudden death Neg Hx   . Stroke Mother     dec age 43  . Hypertension Mother   . Cancer Father     lung age 1  . Cancer Sister     dec colon ca age 55  . Hypertension Sister     Allergies  Allergies  Allergen Reactions  . Amoxicillin-Pot Clavulanate Nausea And Vomiting    NAUSEA AND VOMITING  . Levofloxacin Nausea And Vomiting     Current Facility-Administered Medications  Medication Dose Route Frequency Provider Last Rate Last Dose  . HYDROmorphone (DILAUDID) injection 1 mg  1 mg Intravenous Once Joycie Peek, PA-C       Current Outpatient Prescriptions  Medication Sig Dispense Refill  . albuterol (PROVENTIL,VENTOLIN) 90 MCG/ACT inhaler Inhale 2 puffs into the lungs every 4 (four) hours as needed for wheezing or shortness of breath.     . budesonide-formoterol (SYMBICORT) 160-4.5 MCG/ACT inhaler Inhale 2 puffs into the lungs 2 (two) times daily.      . clobetasol cream (TEMOVATE) 0.05 % APPLY TOPICALLY TWICE WEEKLY 30 g 0  . clopidogrel (PLAVIX) 75 MG tablet Take 1 tablet (75 mg total) by mouth daily  with breakfast. 30 tablet 11  . ibuprofen (ADVIL,MOTRIN) 400 MG tablet Take 400 mg by mouth every 6 (six) hours as needed for mild pain or moderate pain.    . traMADol (ULTRAM) 50 MG tablet Take 1 tablet (50 mg total) by mouth every 6 (six) hours as needed for severe pain. 30 tablet 0  . aspirin 325 MG tablet Take 325 mg by mouth daily.      ROS:   General:  No weight loss, Fever, chills  HEENT: No recent headaches, no nasal bleeding, no visual changes, no sore throat  Neurologic: No dizziness, blackouts, seizures. No recent symptoms of stroke or mini- stroke. No recent episodes of slurred speech, or temporary blindness.  Cardiac: No recent episodes of chest pain/pressure, no shortness of breath at rest.  + shortness of breath with exertion.   Denies history of atrial fibrillation or irregular heartbeat  Vascular: No history of rest pain in feet.  No history of claudication.  No history of non-healing ulcer, No history of DVT   Pulmonary: No home oxygen, no productive cough, no hemoptysis,  No asthma or wheezing  Musculoskeletal:  [ ]  Arthritis, [ ]  Low back pain,  [x ] Joint pain  Hematologic:No history of hypercoagulable state.  No history of easy bleeding.  No history of anemia  Gastrointestinal: No hematochezia or melena,  No gastroesophageal reflux, no trouble swallowing  Urinary: [ ]  chronic Kidney disease, [ ]  on HD - [ ]  MWF or [ ]  TTHS, [ ]  Burning with urination, [ ]  Frequent urination, [ ]  Difficulty urinating;   Skin: No rashes  Psychological: No history of anxiety,  No history of depression   Physical Examination  Filed Vitals:   05/13/15 1406 05/13/15 1417 05/13/15 1603  BP: 143/88 111/64 156/90  Pulse: 104 78 101  Temp: 98.4 F (36.9 C) 98.1 F (36.7 C)   TempSrc: Oral Oral   Resp: 18 16 16   Height: 5\' 6"  (1.676 m)    Weight: 175 lb (79.379 kg)    SpO2: 96% 100% 98%    Body mass index is 28.26 kg/(m^2).  General:  Alert and oriented, no acute distress HEENT: Normal Neck: No JVD Pulmonary: Clear to auscultation bilaterally Cardiac: Regular Rate and Rhythm Abdomen: Soft, non-tender, non-distended Skin: No rash, right first toe dusky to the PIP joint with some erythema over the metatarsal head Extremity Pulses:  2+ radial, brachial, femoral, absent right dorsalis pedis, 2+ right posterior tibial pulses, 2+ right DP, absent left PT Musculoskeletal: No deformity or edema  Neurologic: Upper and lower extremity motor 5/5 and symmetric  DATA:  agram images reviewed as well as notes from Dr Allyson Sabal and Dr Randa Evens findings as per HPI  CBC    Component Value Date/Time   WBC 11.1* 05/13/2015 1625   RBC 4.81 05/13/2015 1625   HGB 13.2 05/13/2015 1625   HCT 41.0 05/13/2015 1625   PLT 471* 05/13/2015  1625   MCV 85.2 05/13/2015 1625   MCH 27.4 05/13/2015 1625   MCHC 32.2 05/13/2015 1625   RDW 13.2 05/13/2015 1625   LYMPHSABS 2.1 05/13/2015 1625   MONOABS 0.9 05/13/2015 1625   EOSABS 0.3 05/13/2015 1625   BASOSABS 0.1 05/13/2015 1625     BMET    Component Value Date/Time   NA 136 05/13/2015 1625   K 4.0 05/13/2015 1625   CL 98* 05/13/2015 1625   CO2 23 05/13/2015 1625   GLUCOSE 91 05/13/2015 1625   BUN 6 05/13/2015 1625  CREATININE 0.78 05/13/2015 1625   CREATININE 0.73 04/25/2015 1128   CALCIUM 9.2 05/13/2015 1625   GFRNONAA >60 05/13/2015 1625   GFRAA >60 05/13/2015 1625    ASSESSMENT:  Infarcted right first toe with pain.  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  Right first toe amputation on Monday 05/19/15  2.  Encouraged pt to at least take aspirin daily if she could not take plavix.  3.  Pain med until Monday per ER.  Will need consideration for aortic endarterectomy if fails antiplatelet therapy  Fabienne Bruns, MD [facility] [place] Office: [phone] Pager: [phone]

## 2015-05-19 NOTE — Transfer of Care (Signed)
Immediate Anesthesia Transfer of Care Note  Patient: Angela Alvarez  Procedure(s) Performed: Procedure(s): RIGHT FIRST RAY AMPUTATION (Right)  Patient Location: PACU  Anesthesia Type:General  Level of Consciousness: awake, alert , oriented and patient cooperative  Airway & Oxygen Therapy: Patient Spontanous Breathing and Patient connected to nasal cannula oxygen  Post-op Assessment: Report given to RN, Post -op Vital signs reviewed and stable and Patient moving all extremities  Post vital signs: Reviewed and stable  Last Vitals:  Filed Vitals:   05/19/15 1144  BP: 134/68  Resp: 18    Complications: No apparent anesthesia complications

## 2015-05-19 NOTE — Progress Notes (Signed)
Patient's prescription for hydrocodone/apap 5/325 # 30 given to patient's friend Sheree to fill for patient's anticipated discharge in AM.

## 2015-05-19 NOTE — Anesthesia Preprocedure Evaluation (Signed)
Anesthesia Evaluation  Patient identified by MRN, date of birth, ID band Patient awake    Reviewed: Allergy & Precautions, NPO status , Patient's Chart, lab work & pertinent test results  Airway Mallampati: II  TM Distance: >3 FB Neck ROM: Full    Dental  (+) Teeth Intact Upper bridge fixed in place:   Pulmonary asthma , COPD, Current Smoker,    breath sounds clear to auscultation       Cardiovascular + Peripheral Vascular Disease   Rhythm:Regular Rate:Normal     Neuro/Psych negative neurological ROS     GI/Hepatic negative GI ROS, Neg liver ROS,   Endo/Other  negative endocrine ROS  Renal/GU negative Renal ROS     Musculoskeletal negative musculoskeletal ROS (+)   Abdominal   Peds  Hematology negative hematology ROS (+)   Anesthesia Other Findings   Reproductive/Obstetrics                             Anesthesia Physical Anesthesia Plan  ASA: III  Anesthesia Plan: General   Post-op Pain Management:    Induction: Intravenous  Airway Management Planned: LMA  Additional Equipment:   Intra-op Plan:   Post-operative Plan: Extubation in OR  Informed Consent: I have reviewed the patients History and Physical, chart, labs and discussed the procedure including the risks, benefits and alternatives for the proposed anesthesia with the patient or authorized representative who has indicated his/her understanding and acceptance.   Dental advisory given  Plan Discussed with: CRNA and Surgeon  Anesthesia Plan Comments:         Anesthesia Quick Evaluation

## 2015-05-19 NOTE — Op Note (Signed)
Procedure: Amputation right first toe Preoperative diagnosis: Gangrene right first toe  Postoperative diagnosis: Same  Anesthesia: Gen.  Specimens: Right first toe  Assistant: Nurse  Operative details: After obtaining informed consent, the patient was taken to the operating room. The patient was placed in supine position on the operating room table. After induction of general anesthesia the patient's entire right foot was prepped and draped in usual sterile fashion. A circumferential incision was made at the base of the right first toe. The incision was carried all the way down to the level of the bone. There was good skin edge bleeding. This was controlled with cautery. The bone was transected with a bone cutter in the distal portion of the proximal phalanx. All but a small portion of the distal phalanx was debrided away with rongeurs.  The wound was thoroughly irrigated with normal saline solution. Hemostasis was obtained. Skin edges were reapproximated using interrupted 3-0 Vertical mattress and simple nylon sutures. A dry sterile dressing was applied. The patient tolerated procedure well and there were no complications. Instrument sponge and needle counts were correct at the end of the case. The patient was taken to recovery in stable condition.   Fabienne Bruns, MD  [facility] [place]  Office: [phone]  Pager: [phone]

## 2015-05-19 NOTE — Anesthesia Procedure Notes (Signed)
Procedure Name: LMA Insertion Date/Time: 05/19/2015 2:35 PM Performed by: Roney Mans P Pre-anesthesia Checklist: Patient identified, Emergency Drugs available, Suction available, Patient being monitored and Timeout performed Patient Re-evaluated:Patient Re-evaluated prior to inductionOxygen Delivery Method: Circle system utilized Preoxygenation: Pre-oxygenation with 100% oxygen Intubation Type: IV induction Ventilation: Mask ventilation without difficulty LMA: LMA inserted LMA Size: 4.0 Number of attempts: 1 Placement Confirmation: positive ETCO2 and breath sounds checked- equal and bilateral Tube secured with: Tape Dental Injury: Teeth and Oropharynx as per pre-operative assessment

## 2015-05-19 NOTE — Anesthesia Postprocedure Evaluation (Signed)
Anesthesia Post Note  Patient: Mande Auvil  Procedure(s) Performed: Procedure(s) (LRB): RIGHT FIRST RAY AMPUTATION (Right)  Patient location during evaluation: PACU Anesthesia Type: General Level of consciousness: awake and alert Pain management: pain level controlled (tough to control pain completely) Vital Signs Assessment: post-procedure vital signs reviewed and stable Respiratory status: spontaneous breathing, nonlabored ventilation, respiratory function stable and patient connected to nasal cannula oxygen Cardiovascular status: blood pressure returned to baseline and stable Postop Assessment: no signs of nausea or vomiting Anesthetic complications: no    Last Vitals:  Filed Vitals:   05/19/15 1537 05/19/15 1550  BP: 133/87 156/76  Pulse: 86 84  Temp:    Resp: 17     Last Pain:  Filed Vitals:   05/19/15 1556  PainSc: 7                  Aulton Routt,JAMES TERRILL

## 2015-05-20 ENCOUNTER — Encounter (HOSPITAL_COMMUNITY): Payer: Self-pay | Admitting: Vascular Surgery

## 2015-05-20 DIAGNOSIS — Z0279 Encounter for issue of other medical certificate: Secondary | ICD-10-CM

## 2015-05-20 MED ORDER — ONDANSETRON HCL 4 MG PO TABS: 4.0000 mg | ORAL_TABLET | Freq: Four times a day (QID) | ORAL | Status: DC

## 2015-05-20 NOTE — Progress Notes (Addendum)
  [facility] Progress Note  Subjective  - POD #1  Having some pain with right toe amp site  Objective Filed Vitals:   05/19/15 2150 05/20/15 0521  BP: 133/70 150/65  Pulse: 78 72  Temp: 98.2 F (36.8 C) 98.4 F (36.9 C)  Resp: 18 18    Intake/Output Summary (Last 24 hours) at 05/20/15 0756 Last data filed at 05/19/15 1800  Gross per 24 hour  Intake    700 ml  Output    420 ml  Net    280 ml   Right toe amputation with stiches intact. No drainage seen.   Assessment/Planning: 60 y.o. female is s/p: right first toe amputation 1 Day Post-Op   Amputation site clean and intact.  Ok to d/c home today.  Right weightbearing heel only with Darco shoe.  F/u in 3 weeks with Dr. Darrick Penna.   Raymond Gurney 05/20/2015 7:56 AM --  Laboratory CBC    Component Value Date/Time   WBC 8.5 05/19/2015 1907   HGB 12.9 05/19/2015 1907   HCT 40.2 05/19/2015 1907   PLT 410* 05/19/2015 1907    BMET    Component Value Date/Time   NA 138 05/19/2015 1207   K 4.0 05/19/2015 1207   CL 98* 05/13/2015 1625   CO2 23 05/13/2015 1625   GLUCOSE 111* 05/19/2015 1207   BUN 6 05/13/2015 1625   CREATININE 0.70 05/19/2015 1907   CREATININE 0.73 04/25/2015 1128   CALCIUM 9.2 05/13/2015 1625   GFRNONAA >60 05/19/2015 1907   GFRAA >60 05/19/2015 1907    COAG Lab Results  Component Value Date   INR 1.02 05/13/2015   INR 1.00 04/25/2015   No results found for: PTT  Antibiotics Anti-infectives    Start     Dose/Rate Route Frequency Ordered Stop   05/19/15 1230  vancomycin (VANCOCIN) IVPB 1000 mg/200 mL premix     1,000 mg 200 mL/hr over 60 Minutes Intravenous To ShortStay Surgical 05/16/15 0815 05/19/15 1439       Maris Berger, PA-C [facility] Office: [phone] Pager: [phone] 05/20/2015 7:56 AM     I have examined the patient, reviewed and agree with above. Amputation site looks good this morning. Ready for discharge  Gretta Began, MD 05/20/2015 8:02 AM

## 2015-05-20 NOTE — Progress Notes (Signed)
Pt has been discharged. Pt and pt's daughter received discharge instructions and all questions were answered. Pt's IV and telemetry box were removed. Pt was given supplies for dressing changes for her right great toe amputation. Pt left the floor via wheelchair and was accompanied by her daughter and a Insurance account manager. Pt was in no distress at time of discharge.   Berdine Dance RN, BSN

## 2015-05-20 NOTE — Care Management Note (Signed)
Case Management Note Donn Pierini RN, BSN Unit 2W-Case Manager [phone]  Patient Details  Name: Angela Alvarez MRN: [id number] Date of Birth: April 09, 1955  Subjective/Objective:   Pt admitted with gangrene of first toe- s/p amputation                 Action/Plan: PTA pt lived at home- plan to d/c home today- no CM needs noted  Expected Discharge Date:   05/20/15               Expected Discharge Plan:  Home/Self Care  In-House Referral:     Discharge planning Services  CM Consult  Post Acute Care Choice:    Choice offered to:     DME Arranged:    DME Agency:     HH Arranged:    HH Agency:     Status of Service:  Completed, signed off  Medicare Important Message Given:    Date Medicare IM Given:    Medicare IM give by:    Date Additional Medicare IM Given:    Additional Medicare Important Message give by:     If discussed at Long Length of Stay Meetings, dates discussed:    Additional Comments:  Darrold Span, RN 05/20/2015, 10:03 AM

## 2015-05-20 NOTE — Progress Notes (Signed)
Utilization review completed. Deatra Mcmahen, RN, BSN. 

## 2015-05-21 ENCOUNTER — Telehealth: Payer: Self-pay | Admitting: Vascular Surgery

## 2015-05-21 NOTE — Telephone Encounter (Signed)
-----   Message from Sharee Pimple, RN sent at 05/19/2015  3:26 PM EST ----- Regarding: schedule   ----- Message -----    From: Raymond Gurney, PA-C    Sent: 05/19/2015   3:13 PM      To: Vvs Charge Pool  S/p right great toe amputation 05/19/15  F/u with Dr. Darrick Penna in 3 weeks.  Thanks Selena Batten

## 2015-05-21 NOTE — Telephone Encounter (Signed)
Spoke with pt to schedule, dpm °

## 2015-05-22 NOTE — Discharge Summary (Signed)
[facility] Discharge Summary  Angela Alvarez 10-Apr-1955 59 y.o. female  [id number]  Admission Date: 05/19/2015  Discharge Date: 05/20/2015  Physician: Fabienne Bruns, MD  Admission Diagnosis: Infarcted R 1st Toe  M62 89 Pain R 1st Toe  M79 674  HPI:   This is a 60 y.o. female who presented  for evaluation of painful right first toe. The patient noticed the toe turning dark several weeks ago. She underwent arteriogram by Dr Allyson Sabal 04/28/15 which showed mobile plaque in the distal abdominal aorta as well as right mid SFA stenosis and occlusion of AT. She also had occlusion of the PT and mid SFA stenosis on the contralateral side. She was started on Plavix but has not taken it due to GI upset. She states she can take aspirin. She denies claudication. She has been on Tramadol for the pain but it is no longer working. She says she is trying to quit smoking but has not started the Chantix she was prescribed a few days ago. She denies fever or chills. She also saw Dr Wray Kearns at [facility] for a second opinion earlier this week wo agreed with a trial of antiplatelet therapy but was not optimistic the toe would survive. Other medical problems include COPD but this has been stable.  Hospital Course:  The patient was admitted to the hospital and taken to the operating room on 05/19/2015 and underwent: amputation right first toe    The patient tolerated the procedure well and was transported to the PACU in stable condition.   The patient was admitted overnight for IV pain control.   POD 1: Her amputation site was clean and intact. Her pain was controlled off of IV pain medication. She was instructed to ambulate weightbearing heel only with a Darco shoe. She was discharged home on POD 1 in good condition.     CBC    Component Value Date/Time   WBC 8.5 05/19/2015 1907   RBC 4.64 05/19/2015 1907   HGB 12.9 05/19/2015 1907   HCT 40.2 05/19/2015 1907   PLT  410* 05/19/2015 1907   MCV 86.6 05/19/2015 1907   MCH 27.8 05/19/2015 1907   MCHC 32.1 05/19/2015 1907   RDW 13.7 05/19/2015 1907   LYMPHSABS 2.1 05/13/2015 1625   MONOABS 0.9 05/13/2015 1625   EOSABS 0.3 05/13/2015 1625   BASOSABS 0.1 05/13/2015 1625    BMET    Component Value Date/Time   NA 138 05/19/2015 1207   K 4.0 05/19/2015 1207   CL 98* 05/13/2015 1625   CO2 23 05/13/2015 1625   GLUCOSE 111* 05/19/2015 1207   BUN 6 05/13/2015 1625   CREATININE 0.70 05/19/2015 1907   CREATININE 0.73 04/25/2015 1128   CALCIUM 9.2 05/13/2015 1625   GFRNONAA >60 05/19/2015 1907   GFRAA >60 05/19/2015 1907     Discharge Instructions:   The patient is discharged to home with extensive instructions on wound care and progressive ambulation.  They are instructed not to drive or perform any heavy lifting until returning to see the physician in his office.  Discharge Instructions    Call MD for:  redness, tenderness, or signs of infection (pain, swelling, bleeding, redness, odor or green/yellow discharge around incision site)    Complete by:  As directed      Call MD for:  severe or increased pain, loss or decreased feeling  in affected limb(s)    Complete by:  As directed      Call MD for:  temperature >100.5    Complete by:  As directed      Discharge wound care:    Complete by:  As directed   Remove dressing tomorrow. Then wash foot daily with soap and water and pat dry. Do not apply any creams or ointments. You may redress your foot with gauze and ACE wrap.     Driving Restrictions    Complete by:  As directed   No driving for 2 weeks     Increase activity slowly    Complete by:  As directed   Weightbearing on heel of right foot only. Walk with assistance use walker or cane as needed     Post-op shoe    Complete by:  As directed   Right Darco shoe     Resume previous diet    Complete by:  As directed            Discharge Diagnosis:  Infarcted R 1st Toe  M62 89 Pain R 1st Toe   M79 674  Secondary Diagnosis: Patient Active Problem List   Diagnosis Date Noted  . Atherosclerosis of native arteries of extremities with gangrene, unspecified extremity (HCC) 05/19/2015  . Critical lower limb ischemia 04/09/2015  . ASCUS with positive high risk human papillomavirus of vagina 01/09/2014  . Foot pain 12/04/2010  . Metatarsalgia 12/04/2010  . Right knee pain 11/23/2010  . SMOKER 01/08/2010  . COPD 01/07/2010   Past Medical History  Diagnosis Date  . Asthma   . COPD (chronic obstructive pulmonary disease) (HCC)   . Abnormal Pap smear of cervix 06-01-13    ascus pap:pos.HR HPV/hx of cryotherapy to cx 1985  . Critical lower limb ischemia     ischemic appearing left first toe  . Peripheral vascular disease (HCC)        Medication List    TAKE these medications        albuterol 90 MCG/ACT inhaler  Commonly known as:  PROVENTIL,VENTOLIN  Inhale 2 puffs into the lungs every 4 (four) hours as needed for wheezing or shortness of breath.     aspirin 325 MG tablet  Take 325 mg by mouth daily.     budesonide-formoterol 160-4.5 MCG/ACT inhaler  Commonly known as:  SYMBICORT  Inhale 2 puffs into the lungs 2 (two) times daily.     clobetasol cream 0.05 %  Commonly known as:  TEMOVATE  APPLY TOPICALLY TWICE WEEKLY     clopidogrel 75 MG tablet  Commonly known as:  PLAVIX  Take 1 tablet (75 mg total) by mouth daily with breakfast.     HYDROcodone-acetaminophen 5-325 MG tablet  Commonly known as:  NORCO/VICODIN  Take 1-2 tablets by mouth every 4 (four) hours as needed.     ibuprofen 400 MG tablet  Commonly known as:  ADVIL,MOTRIN  Take 400 mg by mouth every 6 (six) hours as needed for mild pain or moderate pain.     ondansetron 4 MG tablet  Commonly known as:  ZOFRAN  Take 1 tablet (4 mg total) by mouth every 6 (six) hours.     traMADol 50 MG tablet  Commonly known as:  ULTRAM  Take 1 tablet (50 mg total) by mouth every 6 (six) hours as needed for severe  pain.        Norco #30 No Refill  Disposition: Home  Patient's condition: is Good  Follow up: 1. Dr. Darrick Penna in 3 weeks   Maris Berger, [place] [facility] [phone] 05/22/2015  12:26  PM

## 2015-05-23 ENCOUNTER — Ambulatory Visit: Payer: Managed Care, Other (non HMO) | Admitting: Cardiovascular Disease

## 2015-05-28 ENCOUNTER — Telehealth: Payer: Self-pay | Admitting: Cardiovascular Disease

## 2015-05-28 NOTE — Telephone Encounter (Signed)
Received Attending Provider Statement Monia Pouch) back from [facility] @ [facility] for Dr Allyson Sabal to review and sign.  Forms put in Dr Hazle Coca correspondence for signing. lp

## 2015-06-09 ENCOUNTER — Encounter: Payer: Self-pay | Admitting: Vascular Surgery

## 2015-06-12 ENCOUNTER — Encounter: Payer: Self-pay | Admitting: Vascular Surgery

## 2015-06-12 ENCOUNTER — Telehealth: Payer: Self-pay | Admitting: Cardiovascular Disease

## 2015-06-12 ENCOUNTER — Encounter: Payer: Self-pay | Admitting: *Deleted

## 2015-06-12 ENCOUNTER — Ambulatory Visit (INDEPENDENT_AMBULATORY_CARE_PROVIDER_SITE_OTHER): Payer: Managed Care, Other (non HMO) | Admitting: Vascular Surgery

## 2015-06-12 VITALS — BP 138/80 | HR 105 | Temp 97.3°F | Resp 18 | Ht 66.0 in | Wt 161.3 lb

## 2015-06-12 DIAGNOSIS — I75021 Atheroembolism of right lower extremity: Secondary | ICD-10-CM

## 2015-06-12 MED ORDER — ONDANSETRON HCL 4 MG PO TABS: 4.0000 mg | ORAL_TABLET | Freq: Four times a day (QID) | ORAL | Status: DC

## 2015-06-12 NOTE — Telephone Encounter (Signed)
[facility] from Dr Allyson SabalBerry.  Faxed to [employer] and notified patient.  Faxed on 06/12/15 Copy mailed to patient. lp

## 2015-06-12 NOTE — Progress Notes (Signed)
Patient is a 60 year old female who returns for follow-up today. She underwent amputation of her right first toe on February 13. 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 She denies any drainage or redness around the wound. She still has some pain but overall is improved. She is currently not taking aspirin and I encouraged her to resume this today. I also discussed with her the possibility of starting Plavix. She has had some GI upset with this in the past but states she will try it one more time. She has quit smoking with the assistance of Chantix. She states she does have some nausea when taking the Chantix and has requested some Zofran for the nausea symptoms.   review of systems: She has shortness of breath with exertion. She denies chest pain.    Physical exam:  Filed Vitals:   06/12/15 1256  BP: 138/80  Pulse: 105  Temp: 97.3 F (36.3 C)  TempSrc: Oral  Resp: 18  Height: 5\' 6"  (1.676 m)  Weight: 161 lb 4.8 oz (73.165 kg)  SpO2: 97%     Right lower extremity: Well-healed first toe amputation site   Data: I reviewed the patient's arteriogram with her in the room today. This shows a calcified exophytic plaque obstructing 50-75% of the lumen of the aorta just above the bifurcation. She also had a mid right SFA stenosis.   Assessment: Healing right first toe amputation most likely atheroembolic source was the aortic lesion. I discussed with her the possibility today of aortic endarterectomy for reconstruction of her aorta at this level. I discussed with her the risks benefits possible, patient and procedure details including but not limited to risk of transfusion 10-15 percent, myocardial events 5%, risk of infection 1-2%.   I also discussed with her the possibility of recurrent atheroembolic events without operation. She wishes to think about this for now.  Plan: 1 referral to  cardiology for shortness of breath with exertion  Rule out coronary artery disease#2 aortic endarterectomy patient wished to proceed otherwise she'll see me back for follow-up in 6 months with repeat ABIs   okay for patient to return to work full duty in one week  Fabienne Brunsharles Ora Mcnatt, MD [facility] [place] Office: [phone] Pager: [phone]

## 2015-06-13 ENCOUNTER — Telehealth: Payer: Self-pay | Admitting: Vascular Surgery

## 2015-06-13 NOTE — Telephone Encounter (Signed)
Spoke with pt. She was busy and asked me to leave vm re appt. I called back and lvm re cardiology appointment 06/17/15 9am with Dr. Huey BienenstockBrian Hager [facility]. Pt verbalized understanding.

## 2015-06-16 NOTE — Addendum Note (Signed)
Addended by: Sharee PimpleMCCHESNEY, Kalyn Hofstra K on: 06/16/2015 04:14 PM   Modules accepted: Orders

## 2015-06-17 ENCOUNTER — Telehealth: Payer: Self-pay | Admitting: Vascular Surgery

## 2015-06-17 ENCOUNTER — Ambulatory Visit: Payer: Managed Care, Other (non HMO) | Admitting: Physician Assistant

## 2015-06-17 NOTE — Telephone Encounter (Signed)
Spoke with patient after noting that she cancelled the cardiology appointment I scheduled for her. She said she was all confused, thinking it was an old appointment, so she cancelled it. She asked if I would reschedule it for her. Her new appointment is 06/30/15 3:30 pm with Dr. Huey BienenstockBrian Hager [phone]. Pt is aware.

## 2015-06-30 ENCOUNTER — Encounter: Payer: Self-pay | Admitting: Physician Assistant

## 2015-06-30 ENCOUNTER — Ambulatory Visit (INDEPENDENT_AMBULATORY_CARE_PROVIDER_SITE_OTHER): Payer: Managed Care, Other (non HMO) | Admitting: Physician Assistant

## 2015-06-30 VITALS — BP 130/78 | HR 88 | Ht 66.0 in | Wt 163.0 lb

## 2015-06-30 DIAGNOSIS — Z13220 Encounter for screening for lipoid disorders: Secondary | ICD-10-CM

## 2015-06-30 DIAGNOSIS — I70269 Atherosclerosis of native arteries of extremities with gangrene, unspecified extremity: Secondary | ICD-10-CM | POA: Diagnosis not present

## 2015-06-30 DIAGNOSIS — F17200 Nicotine dependence, unspecified, uncomplicated: Secondary | ICD-10-CM | POA: Diagnosis not present

## 2015-06-30 DIAGNOSIS — R0609 Other forms of dyspnea: Secondary | ICD-10-CM

## 2015-06-30 NOTE — Progress Notes (Signed)
Patient ID: Angela KentCatherine Cuaresma, female   DOB: 1956-02-19, 60 y.o.   MRN: [id number]    Date:  06/30/2015   ID:  Angela Alvarez, DOB 1956-02-19, MRN [id number]  PCP:  Pearson GrippeJames Kim, MD  Primary Cardiologist:  berry  Chief Complaint  Patient presents with  . Shortness of Breath    With excersion on and off for 6 months     History of Present Illness: Angela KentCatherine Rude is a 60 y.o. female  With history of COPD , peripheral arterial disease with critical lower limb ischemia the left first toe.  Doppler's revealed normal ABIs bilaterally with a high-frequency signal in her mid right SFA.   She underwent lower extremity peripheral angiogram which revealed moderately atherosclerotic distal abdominal aorta with a mobile atheroma angiographically. She has what appears to be a atheroma mid right SFA and an occluded anterior tibial at the ankle.   Dr. Allyson SabalBerry did not think she had viable endovascular or surgical options at this point.  He treated her with aspirin and Plavix as well as analgesics.   On February 13 she underwent amputation of the right first toe by Dr. Evelina DunField's. This was thought to be precipitated by the atheroembolic event from a large calcified exophytic plaque in her aorta adjacent to the bifurcation.  She presents for follow-up for PAD.  She is doing well since her toe amputation.  It is still very sensitive but healing well.  She was prescribed a statin by [facility] but she is not taking it because she is not going to see him any more.  She reports DOE for the last six months but no resting dyspnea.   She also reports some hematochezia since before starting Plavix. Her last hemoglobin was February 2013 and stable.    The patient currently denies nausea, vomiting, fever, chest pain, orthopnea, dizziness, PND, cough, congestion, abdominal pain, melena, lower extremity edema, claudication.  PV Angiogram results: 1: Abdominal aortogram-the distal abdominal aorta was moderately  atherosclerotic with some angiographically visualized mobile atheroma at the origin of the right common iliac artery. 2: Left lower extremity-75-80% fairly focal mid left SFA stenosis with 2 vessel runoff and occluded anterior tibial 3: Right lower extremity-90% fairly focal mid right SFA stenosis with an occluded anterior tibial ankle widely patent posterior tibial as well as dorsal pedal arch.   Wt Readings from Last 3 Encounters:  06/30/15 163 lb (73.936 kg)  06/12/15 161 lb 4.8 oz (73.165 kg)  05/19/15 175 lb (79.379 kg)     Past Medical History  Diagnosis Date  . Asthma   . COPD (chronic obstructive pulmonary disease) (HCC)   . Abnormal Pap smear of cervix 06-01-13    ascus pap:pos.HR HPV/hx of cryotherapy to cx 1985  . Critical lower limb ischemia     ischemic appearing left first toe  . Peripheral vascular disease (HCC)     Current Outpatient Prescriptions  Medication Sig Dispense Refill  . albuterol (PROVENTIL,VENTOLIN) 90 MCG/ACT inhaler Inhale 2 puffs into the lungs every 4 (four) hours as needed for wheezing or shortness of breath.     Marland Kitchen. aspirin 325 MG tablet Take 325 mg by mouth daily. Reported on 06/12/2015    . budesonide-formoterol (SYMBICORT) 160-4.5 MCG/ACT inhaler Inhale 2 puffs into the lungs 2 (two) times daily.      . clobetasol cream (TEMOVATE) 0.05 % APPLY TOPICALLY TWICE WEEKLY 30 g 0  . clopidogrel (PLAVIX) 75 MG tablet Take 1 tablet (75 mg total) by mouth daily  with breakfast. 30 tablet 11  . ondansetron (ZOFRAN) 4 MG tablet Take 1 tablet (4 mg total) by mouth every 6 (six) hours. 60 tablet 1  . varenicline (CHANTIX) 1 MG tablet Take 1 mg by mouth 2 (two) times daily.     No current facility-administered medications for this visit.    Allergies:    Allergies  Allergen Reactions  . Amoxicillin-Pot Clavulanate Nausea And Vomiting    NAUSEA AND VOMITING  . Levofloxacin Nausea And Vomiting    Social History:  The patient  reports that she quit smoking  about 3 weeks ago. Her smoking use included Cigarettes. She smoked 1.00 pack per day. She does not have any smokeless tobacco history on file. She reports that she does not drink alcohol or use illicit drugs.   Family history:   Family History  Problem Relation Age of Onset  . Diabetes Neg Hx   . Heart attack Neg Hx   . Hyperlipidemia Neg Hx   . Sudden death Neg Hx   . Stroke Mother     dec age 37  . Hypertension Mother   . Cancer Father     lung age 2  . Cancer Sister     dec colon ca age 48  . Hypertension Sister     ROS:  Please see the history of present illness.  All other systems reviewed and negative.   PHYSICAL EXAM: VS:  BP 130/78 mmHg  Pulse 88  Ht  (1.676 m)  Wt 163 lb (73.936 kg)  BMI 26.32 kg/m2  LMP 04/05/2004 Well nourished, well developed, in no acute distress HEENT: Pupils are equal round react to light accommodation extraocular movements are intact.  Neck: no JVDNo cervical lymphadenopathy. Cardiac: Regular rate and rhythm with 1/6 sys MM LSB.  No rubs or gallops. Lungs:  clear to auscultation bilaterally, no wheezing, rhonchi or rales Abd: soft, nontender, positive bowel sounds all quadrants, no hepatosplenomegaly Ext: no lower extremity edema.  2+ radial pulses.  Right first toe is healing well.  No erythema or discharge.  One suture still in place Skin: warm and dry Neuro:  Grossly normal    ASSESSMENT AND PLAN:  Problem List Items Addressed This Visit    SMOKER - Primary   Dyspnea on exertion   Atherosclerosis of native arteries of extremities with gangrene, unspecified extremity (HCC)   Relevant Orders   Lipid panel    Other Visit Diagnoses    Screening, lipid        Relevant Orders    Lipid panel        Ms. Talley appears to be doing well. Her right first toe was healing well.  She did have one suture still in place so I removed it.   She is not complaining of any claudication. We'll check a lipid panel  In the next couple of days  and then likely start her statin.   I suspect her dyspnea on exertion, which is been occurring for 6 months, is related to COPD from smoking and deconditioning. She does not exercise especially since she just had surgery on her toe.   She denies any chest pain. She does have a murmur on exam but is not symptomatic. May need echocardiogram in the future.   She has been tobacco free for one month.

## 2015-06-30 NOTE — Patient Instructions (Signed)
Your physician recommends that you return for lab work .  Your provider recommends that you schedule a follow-up appointment in: 2-3 months with Dr Allyson SabalBerry.

## 2015-07-22 ENCOUNTER — Encounter: Payer: Self-pay | Admitting: Vascular Surgery

## 2015-07-22 ENCOUNTER — Ambulatory Visit (INDEPENDENT_AMBULATORY_CARE_PROVIDER_SITE_OTHER): Payer: Self-pay | Admitting: Vascular Surgery

## 2015-07-22 VITALS — BP 135/79 | HR 88 | Temp 97.1°F | Resp 16 | Ht 66.0 in | Wt 163.0 lb

## 2015-07-22 DIAGNOSIS — I75021 Atheroembolism of right lower extremity: Secondary | ICD-10-CM

## 2015-07-22 NOTE — Progress Notes (Signed)
   Patient name: Angela Alvarez MRN: [id number] DOB: 07-29-1955 Sex: female  REASON FOR VISIT: Patient walked in today concerned regarding swelling in her right first toe amputation site. She had had right first toe amputation on 05/19/2015 with Dr. Darrick Pennafields. 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 She denies any pain  HPI: Angela Alvarez is a 60 y.o. female she denies any pain. She does report she had some swelling over the weekend with the no pain but had been up on her feet much more than usual.  Current Outpatient Prescriptions  Medication Sig Dispense Refill  . albuterol (PROVENTIL,VENTOLIN) 90 MCG/ACT inhaler Inhale 2 puffs into the lungs every 4 (four) hours as needed for wheezing or shortness of breath.     Marland Kitchen. aspirin 325 MG tablet Take 325 mg by mouth daily. Reported on 06/12/2015    . budesonide-formoterol (SYMBICORT) 160-4.5 MCG/ACT inhaler Inhale 2 puffs into the lungs 2 (two) times daily.      . clobetasol cream (TEMOVATE) 0.05 % APPLY TOPICALLY TWICE WEEKLY 30 g 0  . clopidogrel (PLAVIX) 75 MG tablet Take 1 tablet (75 mg total) by mouth daily with breakfast. 30 tablet 11  . ondansetron (ZOFRAN) 4 MG tablet Take 1 tablet (4 mg total) by mouth every 6 (six) hours. 60 tablet 1  . varenicline (CHANTIX) 1 MG tablet Take 1 mg by mouth 2 (two) times daily.     No current facility-administered medications for this visit.      PHYSICAL EXAM: Filed Vitals:   07/22/15 1026  BP: 135/79  Pulse: 88  Temp: 97.1 F (36.2 C)  Resp: 16  Height: 5\' 6"  (1.676 m)  Weight: 163 lb (73.936 kg)  SpO2: 100%    GENERAL: The patient is a well-nourished female, in no acute distress. The vital signs are documented above. Right foot with a 2+ posterior tibial pulse. No evidence of erythema or fluctuance in her first metatarsal head and no evidence of tendon sheath involvement. No tenderness at all over toe or first metatarsal  MEDICAL ISSUES: Patient was  reassured. No evidence of wound problems or difficulty. Explained that if she is on her feet a great deal she may notice some more swelling in this area. She will notify should she develop any new problems. Otherwise will see Dr. Darrick Pennafields as scheduled in September  Gretta BeganEarly, Elvie Palomo [facility] [place] Beeper: [phone]

## 2015-09-26 ENCOUNTER — Other Ambulatory Visit: Payer: Self-pay | Admitting: Physician Assistant

## 2015-09-26 DIAGNOSIS — K610 Anal abscess: Secondary | ICD-10-CM

## 2015-09-26 DIAGNOSIS — K921 Melena: Secondary | ICD-10-CM

## 2015-09-26 DIAGNOSIS — K603 Anal fistula: Secondary | ICD-10-CM

## 2015-10-02 ENCOUNTER — Ambulatory Visit
Admission: RE | Admit: 2015-10-02 | Discharge: 2015-10-02 | Disposition: A | Discharge status: Home / Self Care | Payer: Managed Care, Other (non HMO) | Source: Ambulatory Visit | Attending: Physician Assistant | Admitting: Physician Assistant

## 2015-10-02 DIAGNOSIS — K610 Anal abscess: Secondary | ICD-10-CM

## 2015-10-02 DIAGNOSIS — K921 Melena: Secondary | ICD-10-CM

## 2015-10-02 DIAGNOSIS — K603 Anal fistula: Secondary | ICD-10-CM

## 2015-10-02 MED ORDER — IOPAMIDOL (ISOVUE-300) INJECTION 61%
100.0000 mL | Freq: Once | INTRAVENOUS | Status: AC | PRN
  Administered 2015-10-02: 100 mL via INTRAVENOUS

## 2015-10-22 ENCOUNTER — Other Ambulatory Visit: Payer: Self-pay | Admitting: Gastroenterology

## 2015-10-30 ENCOUNTER — Telehealth: Payer: Self-pay | Admitting: Vascular Surgery

## 2015-10-30 NOTE — Telephone Encounter (Signed)
-----   Message from Phillips Odor, RN sent at 10/30/2015  2:58 PM EDT ----- Regarding: requests appt. with Dr. Deeann Dowse: [phone] This pt. needs an appt. with Dr. Darrick Penna to discuss abdominal aortic plaque, and treatment options. (had recent CT Abd/ Pelvis per her PCP)  Will not need any further vasc. study at this time.  Note that pt. prefers a late afternoon appt., if possible. This can be done at the next available opening.

## 2015-10-30 NOTE — Telephone Encounter (Signed)
Spoke to pt to sch appt 8/17 at 9:15.

## 2015-11-20 ENCOUNTER — Ambulatory Visit: Payer: Managed Care, Other (non HMO) | Admitting: Vascular Surgery

## 2015-11-27 ENCOUNTER — Ambulatory Visit: Payer: Managed Care, Other (non HMO) | Admitting: Vascular Surgery

## 2015-12-02 ENCOUNTER — Encounter: Payer: Self-pay | Admitting: Vascular Surgery

## 2015-12-04 ENCOUNTER — Ambulatory Visit: Payer: Managed Care, Other (non HMO) | Admitting: Vascular Surgery

## 2015-12-19 ENCOUNTER — Encounter: Payer: Self-pay | Admitting: Vascular Surgery

## 2015-12-25 ENCOUNTER — Encounter: Payer: Self-pay | Admitting: Vascular Surgery

## 2015-12-25 ENCOUNTER — Ambulatory Visit (HOSPITAL_COMMUNITY)
Admission: RE | Admit: 2015-12-25 | Discharge: 2015-12-25 | Disposition: A | Discharge status: Home / Self Care | Payer: Managed Care, Other (non HMO) | Source: Ambulatory Visit | Attending: Vascular Surgery | Admitting: Vascular Surgery

## 2015-12-25 ENCOUNTER — Ambulatory Visit (INDEPENDENT_AMBULATORY_CARE_PROVIDER_SITE_OTHER): Payer: Managed Care, Other (non HMO) | Admitting: Vascular Surgery

## 2015-12-25 VITALS — BP 140/91 | HR 87 | Ht 66.0 in | Wt 169.1 lb

## 2015-12-25 DIAGNOSIS — I739 Peripheral vascular disease, unspecified: Secondary | ICD-10-CM

## 2015-12-25 DIAGNOSIS — I75021 Atheroembolism of right lower extremity: Secondary | ICD-10-CM | POA: Diagnosis not present

## 2015-12-25 NOTE — Progress Notes (Signed)
Patient is a 60 year old female who returns for follow-up today. She underwent amputation of her right first toe on February 13. 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 She denies any drainage or redness around the wound. She still has some pain but overall is improved. She is currently taking aspirin stopped her Plavix secondary to bruising.  She has quit smoking with the assistance of Chantix. He denies any claudication symptoms.    Review of systems: She has shortness of breath with exertion. She denies chest pain.     Physical exam:   Vitals:   12/25/15 1139  BP: (!) 140/91  Pulse: 87  SpO2: 96%  Weight: 169 lb 1.6 oz (76.7 kg)  Height: 5\' 6"  (1.676 m)      Right lower extremity: Well-healed first toe amputation site  2+ right posterior tibial pulse 2+ left dorsalis pedis pulse   Data: I reviewed the patient's arteriogram with her in the room today. This shows a calcified exophytic plaque obstructing 50-75% of the lumen of the aorta just above the bifurcation. She also had a mid right SFA stenosis. I reviewed her CT scan from a few months ago May 2017 which showed atherosclerotic calcification and infrarenal aorta and most likely a filling defect still present from the prior plaque.    Assessment: Healing right first toe amputation most likely atheroembolic source was the aortic lesion.    Plan: Since the patient has now been 7 months without an additional atheroembolic event I believe we can manage this conservatively with just aspirin therapy alone. If she has recurrent events we will need to revisit whether or not to consider an aortic procedure.    Fabienne Brunsharles Tashika Goodin, MD [facility] [place] Office: [phone] Pager: [phone]

## 2016-02-25 ENCOUNTER — Other Ambulatory Visit: Payer: Self-pay | Admitting: General Surgery

## 2016-02-25 NOTE — H&P (Signed)
History of Present Illness Angela Alvarez(Josafat Enrico MD; 02/23/2016 5:02 PM) The patient is a 60 year old female who presents with a complaint of anal problems.   Patient returns with perianal complaints. Patient had undergone incision and drainage of perianal abscess in June 2016. Following the procedure she had resolution of her symptoms but these returned approximately 6 months later. [facility] was [facility] and was diagnosed with an anal fistula. It was recommended that she undergo an anal exam under anesthesia with possible fistulotomy. She decided not to do that. She continues to have recurrent episodes of inflammation which have been treated with antibiotics. She continues to have rectal drainage.  Past Medical History:  Diagnosis Date  . Abnormal Pap smear of cervix 06-01-13   ascus pap:pos.HR HPV/hx of cryotherapy to cx 1985  . Asthma   . COPD (chronic obstructive pulmonary disease) (HCC)   . Critical lower limb ischemia    ischemic appearing left first toe  . Peripheral vascular disease [facility](HCC)    Past Surgical History:  Procedure Laterality Date  . AMPUTATION Right 05/19/2015   Procedure: RIGHT FIRST RAY AMPUTATION;  Surgeon: Sherren Kernsharles E Fields, MD;  [facility];  Service: Vascular;  Laterality: Right;  . BREAST SURGERY Right   . BUNIONECTOMY     bilateral  . CARDIAC CATHETERIZATION Bilateral    Catarct  . COLONOSCOPY W/ POLYPECTOMY    . PERIPHERAL VASCULAR CATHETERIZATION N/A 04/28/2015   Procedure: Lower Extremity Angiography;  Surgeon: Runell GessJonathan J Berry, MD;  [facility];  Service: Cardiovascular;  Laterality: N/A;  . surgery under arm Right    --benign breast tissue   Social History   Social History  . Marital status: Single    Spouse name: N/A  . Number of children: N/A  . Years of education: N/A   Occupational History  . Not on file.   Social History Main Topics  . Smoking status: Former Smoker    Packs/day: 1.00    Types:  Cigarettes    Quit date: 06/07/2015  . Smokeless tobacco: Never Used  . Alcohol use No  . Drug use: No  . Sexual activity: No   Other Topics Concern  . Not on file   Social History Narrative  . No narrative on file   Family History  Problem Relation Age of Onset  . Stroke Mother     dec age 61  . Hypertension Mother   . Cancer Father     lung age 61  . Cancer Sister     dec colon ca age 61  . Hypertension Sister   . Diabetes Neg Hx   . Heart attack Neg Hx   . Hyperlipidemia Neg Hx   . Sudden death Neg Hx     Allergies Michel Bickers(Kelly Dockery, LPN; 91/47/829511/20/2017 3:56 PM) Augmentin *PENICILLINS* Nausea, Vomiting.  Medication History Michel Bickers(Kelly Dockery, LPN; 62/13/086511/20/2017 3:56 PM) Premarin (0.3MG  Tablet, Oral) Active. Medications Reconciled   Review of Systems - General ROS: negative for - chills or fever Respiratory ROS: no cough, shortness of breath, or wheezing Cardiovascular ROS: no chest pain or dyspnea on exertion Gastrointestinal ROS: no abdominal pain, change in bowel habits, or black or bloody stools Genito-Urinary ROS: no dysuria, trouble voiding, or hematuria    Vitals Tresa Endo(Kelly Dockery LPN; 78/46/962911/20/2017 3:55 PM) 02/23/2016 3:55 PM Weight: 183 lb Height: 66in Body Surface Area: 1.93 m Body Mass Index: 29.54 kg/m  Temp.: 97.32F(Oral)  Pulse: 78 (Regular)  BP:  134/86 (Sitting, Left Arm, Standard)       Physical Exam Angela Alvarez(Chantz Montefusco MD; 02/23/2016 5:03 PM) The physical exam findings are as follows: Note:General - appears comfortable, no distress; not diaphorectic  HEENT - normocephalic; sclerae clear, gaze conjugate; mucous membranes moist, dentition good; voice normal CV: RRR  Lungs: CTA  Abd: soft  Rectal - externally there is erythema along the right anterior and medial perineum; there are 2 small openings in the right anterior position with a small amount of purulent drainage from the sinus tract closest to the anal orifice; palpation shows marked  tenderness which is poorly tolerated; there is no fluctuance and no palpable mass  Ext - non-tender without significant edema or lymphedema  Neuro - grossly intact; no tremor    Assessment & Plan Angela Alvarez(Joseguadalupe Stan MD; 02/23/2016 4:05 PM) ANAL FISTULA (K60.3) Impression: 60 year old female who presents to the office for a second opinion regarding an anal fistula. She was treated for a perirectal abscess approximately one year ago by Dr. Gerrit FriendsGerkin. She [facility] Byrd [facility]Waters [facility] and it was recommended that she undergo surgery. She refused at that time. On exam today she has a continued fistula with purulent drainage from her right anterior perineum. We discussed that she will most likely need an exam under anesthesia with fistulotomy versus seton placement.  fter the office visit I received some new information that she was being worked up by GI for possible inflammatory bowel disease.  She has serum markers positive for Crohn's disease.  In light of this new information, I think thatwe will need to do an exam under anesthesia and place a seton in the fistula is found.

## 2016-03-09 NOTE — Addendum Note (Signed)
Addended by: Burton ApleyPETTY, Gerold Sar A on: 03/09/2016 09:21 AM   Modules accepted: Orders

## 2016-04-12 ENCOUNTER — Other Ambulatory Visit: Payer: Self-pay | Admitting: Cardiovascular Disease

## 2016-04-12 DIAGNOSIS — I998 Other disorder of circulatory system: Secondary | ICD-10-CM

## 2016-04-12 DIAGNOSIS — I70229 Atherosclerosis of native arteries of extremities with rest pain, unspecified extremity: Secondary | ICD-10-CM

## 2016-04-26 ENCOUNTER — Inpatient Hospital Stay (HOSPITAL_COMMUNITY): Admission: RE | Admit: 2016-04-26 | Payer: Managed Care, Other (non HMO) | Source: Ambulatory Visit

## 2016-06-25 ENCOUNTER — Encounter: Payer: Self-pay | Admitting: Vascular Surgery

## 2016-07-01 ENCOUNTER — Ambulatory Visit (INDEPENDENT_AMBULATORY_CARE_PROVIDER_SITE_OTHER): Payer: Managed Care, Other (non HMO) | Admitting: Vascular Surgery

## 2016-07-01 ENCOUNTER — Ambulatory Visit (HOSPITAL_COMMUNITY)
Admission: RE | Admit: 2016-07-01 | Discharge: 2016-07-01 | Disposition: A | Discharge status: Home / Self Care | Payer: Managed Care, Other (non HMO) | Source: Ambulatory Visit | Attending: Vascular Surgery | Admitting: Vascular Surgery

## 2016-07-01 ENCOUNTER — Encounter: Payer: Self-pay | Admitting: Vascular Surgery

## 2016-07-01 VITALS — BP 142/81 | HR 84 | Temp 97.7°F | Resp 18 | Ht 66.0 in | Wt 189.0 lb

## 2016-07-01 DIAGNOSIS — I739 Peripheral vascular disease, unspecified: Secondary | ICD-10-CM

## 2016-07-01 DIAGNOSIS — R0989 Other specified symptoms and signs involving the circulatory and respiratory systems: Secondary | ICD-10-CM | POA: Insufficient documentation

## 2016-07-01 DIAGNOSIS — I70269 Atherosclerosis of native arteries of extremities with gangrene, unspecified extremity: Secondary | ICD-10-CM | POA: Diagnosis not present

## 2016-07-01 DIAGNOSIS — I998 Other disorder of circulatory system: Secondary | ICD-10-CM

## 2016-07-01 DIAGNOSIS — I70229 Atherosclerosis of native arteries of extremities with rest pain, unspecified extremity: Secondary | ICD-10-CM

## 2016-07-01 NOTE — Progress Notes (Signed)
Patient is a 61 year old female who returns for follow-up today. 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 This was documented on CT scan and by angiogram. 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 She has had no problems since then other than some occasional pins and needle sensation in the right first toe bed. She denies any claudication symptoms. She has now been tobacco free for one year and quit with the assistance of Chantix.  She is on aspirin and Lipitor.  Review of systems: She denies shortness of breath. She denies chest pain. She denies claudication.  Current Outpatient Prescriptions on File Prior to Visit  Medication Sig Dispense Refill  . albuterol (PROVENTIL,VENTOLIN) 90 MCG/ACT inhaler Inhale 2 puffs into the lungs every 4 (four) hours as needed for wheezing or shortness of breath.     Marland Kitchen. aspirin 325 MG tablet Take 325 mg by mouth daily. Reported on 06/12/2015    . atorvastatin (LIPITOR) 40 MG tablet     . budesonide-formoterol (SYMBICORT) 160-4.5 MCG/ACT inhaler Inhale 2 puffs into the lungs 2 (two) times daily.      . clobetasol cream (TEMOVATE) 0.05 % APPLY TOPICALLY TWICE WEEKLY 30 g 0  . clopidogrel (PLAVIX) 75 MG tablet Take 1 tablet (75 mg total) by mouth daily with breakfast. (Patient not taking: Reported on 12/25/2015) 30 tablet 11  . ondansetron (ZOFRAN) 4 MG tablet Take 1 tablet (4 mg total) by mouth every 6 (six) hours. (Patient not taking: Reported on 12/25/2015) 60 tablet 1  . varenicline (CHANTIX) 1 MG tablet Take 1 mg by mouth 2 (two) times daily.     No current facility-administered medications on file prior to visit.    Data: Patient had bilateral ABIs performed today which were 0.95 on the right 1.01 on the left  Assessment: Patient doing well most likely had an atheroembolic event from ruptured plaque in the aorta. This is been  asymptomatic and stable for over a year. We have previously discussed whether or not to remove this plaque but since she has now been stable for some time and do not believe he needs any intervention at this point.  Plan: The patient will continue her antiplatelet and statin agent. She will follow-up with us in one year for repeat ABIs.  Fabienne Brunsharles Fields, MD [facility] [place] [address] [place] Office: [phone] Pager: [phone]

## 2016-10-08 ENCOUNTER — Other Ambulatory Visit: Payer: Self-pay | Admitting: Internal Medicine

## 2017-07-07 ENCOUNTER — Ambulatory Visit: Payer: Managed Care, Other (non HMO) | Admitting: Family

## 2017-07-07 ENCOUNTER — Encounter (HOSPITAL_COMMUNITY): Payer: Managed Care, Other (non HMO)

## 2017-07-21 HISTORY — PX: KNEE ARTHROPLASTY: SHX992

## 2017-09-08 ENCOUNTER — Ambulatory Visit: Payer: 59 | Admitting: Family

## 2017-09-08 ENCOUNTER — Encounter (HOSPITAL_COMMUNITY): Payer: Managed Care, Other (non HMO)

## 2017-10-12 ENCOUNTER — Encounter: Payer: 59 | Attending: Internal Medicine | Admitting: Internal Medicine

## 2017-10-12 DIAGNOSIS — I70234 Atherosclerosis of native arteries of right leg with ulceration of heel and midfoot: Secondary | ICD-10-CM | POA: Insufficient documentation

## 2017-10-12 DIAGNOSIS — I10 Essential (primary) hypertension: Secondary | ICD-10-CM | POA: Insufficient documentation

## 2017-10-12 DIAGNOSIS — Z7982 Long term (current) use of aspirin: Secondary | ICD-10-CM | POA: Insufficient documentation

## 2017-10-12 DIAGNOSIS — L89613 Pressure ulcer of right heel, stage 3: Secondary | ICD-10-CM | POA: Insufficient documentation

## 2017-10-12 DIAGNOSIS — Z7902 Long term (current) use of antithrombotics/antiplatelets: Secondary | ICD-10-CM | POA: Insufficient documentation

## 2017-10-12 DIAGNOSIS — Z87891 Personal history of nicotine dependence: Secondary | ICD-10-CM | POA: Diagnosis not present

## 2017-10-12 DIAGNOSIS — Z89411 Acquired absence of right great toe: Secondary | ICD-10-CM | POA: Insufficient documentation

## 2017-10-12 DIAGNOSIS — Z8249 Family history of ischemic heart disease and other diseases of the circulatory system: Secondary | ICD-10-CM | POA: Diagnosis not present

## 2017-10-13 ENCOUNTER — Ambulatory Visit (HOSPITAL_COMMUNITY)
Admission: RE | Admit: 2017-10-13 | Discharge: 2017-10-13 | Disposition: A | Discharge status: Home / Self Care | Payer: 59 | Source: Ambulatory Visit | Attending: Vascular Surgery | Admitting: Vascular Surgery

## 2017-10-13 ENCOUNTER — Encounter: Payer: Self-pay | Admitting: Family

## 2017-10-13 ENCOUNTER — Ambulatory Visit: Payer: 59 | Admitting: Family

## 2017-10-13 VITALS — BP 157/93 | HR 75 | Temp 98.3°F | Resp 18 | Ht 66.0 in | Wt 187.0 lb

## 2017-10-13 DIAGNOSIS — I70269 Atherosclerosis of native arteries of extremities with gangrene, unspecified extremity: Secondary | ICD-10-CM

## 2017-10-13 DIAGNOSIS — Z89411 Acquired absence of right great toe: Secondary | ICD-10-CM | POA: Insufficient documentation

## 2017-10-13 DIAGNOSIS — I779 Disorder of arteries and arterioles, unspecified: Secondary | ICD-10-CM

## 2017-10-13 DIAGNOSIS — I998 Other disorder of circulatory system: Secondary | ICD-10-CM | POA: Insufficient documentation

## 2017-10-13 DIAGNOSIS — I70229 Atherosclerosis of native arteries of extremities with rest pain, unspecified extremity: Secondary | ICD-10-CM

## 2017-10-13 DIAGNOSIS — F17200 Nicotine dependence, unspecified, uncomplicated: Secondary | ICD-10-CM | POA: Insufficient documentation

## 2017-10-13 NOTE — Progress Notes (Signed)
[name], [name] ([id number]) Visit Report for 10/12/2017 Chief Complaint Document Details Patient Name: Angela Alvarez, Angela Alvarez Date of Service: 10/12/2017 12:30 PM Medical Record Number: [id number] Patient Account Number: [id number] Date of Birth/Sex: [birth year] (62 y.o. Female) Treating RN: Renne Crigler Primary Care Provider: Pearson Grippe Other Clinician: Referring Provider: Pearson Grippe Treating Provider/Extender: Altamese [place] in Treatment: 0 Information Obtained from: Patient Chief Complaint 10/12/17. Patient is here for review of wound on the right lateral heel Electronic Signature(s) Signed: 10/12/2017 6:13:38 PM By: Baltazar Najjar MD Entered By: Baltazar Najjar on 10/12/2017 13:51:53 Angela Alvarez ([id number]) -------------------------------------------------------------------------------- HPI Details Patient Name: Angela Alvarez Date of Service: 10/12/2017 12:30 PM Medical Record Number: [id number] Patient Account Number: [id number] Date of Birth/Sex: 1955/05/14 (62 y.o. Female) Treating RN: Renne Crigler Primary Care Provider: Pearson Grippe Other Clinician: Referring Provider: Pearson Grippe Treating Provider/Extender: Altamese [place] in Treatment: 0 History of Present Illness HPI Description: ADMISSION 10/12/17 This is a 62 year old woman who is here for review of wound on the right lateral calcaneus close to the weightbearing surface of the heel. She is not a diabetic. She has a complicated vascular history initially presenting to Dr. Gery Pray in 2017 with a left great toe discoloration. 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 She also had a 75-80% fairly focal mid left SFA stenosis with 2 vessel runoff and occluded anterior tibial. On the right a 90% fairly focal mid SFA stenosis with an occluded anterior tibial artery widely patent posterior tibial  as well as dorsalis pedis arch. She was not felt to have a viable endovascular or surgical options. She was started on aspirin and Plavix as well as statins. The patient has not really been taking aspirin and stop the Plavix recently because of skin bruising. Last arterial studies on 07/01/16 showed triphasic waveforms at the right posterior tibial and biphasic at the dorsalis pedis ABI of 0.95 on the right. Currently she states that in the 5-6 months she developed a callus in the area of the wound and eventually this came off but she was left with an open sore. She has been putting topical antibiotics on this herself and indeed there appears to have already been a lot of healing. She has been vigorously offloading this using sandals rather than anything that might rub on the wound. Electronic Signature(s) Signed: 10/12/2017 6:13:38 PM By: Baltazar Najjar MD Entered By: Baltazar Najjar on 10/12/2017 13:55:08 Angela Alvarez ([id number]) -------------------------------------------------------------------------------- Physical Exam Details Patient Name: Angela Alvarez Date of Service: 10/12/2017 12:30 PM Medical Record Number: [id number] Patient Account Number: [id number] Date of Birth/Sex: [birth year] (62 y.o. Female) Treating RN: Renne Crigler Primary Care Provider: Pearson Grippe Other Clinician: Referring Provider: Pearson Grippe Treating Provider/Extender: Altamese  in Treatment: 0 Constitutional Patient is hypertensive.. Pulse regular and within target range for patient.Marland Kitchen Respirations regular, non-labored and within target range.. Temperature is normal and within the target range for the patient.Marland Kitchen appears in no distress. Eyes Conjunctivae clear. No discharge. Respiratory Respiratory effort is easy and symmetric bilaterally. Rate is normal at rest and on room air.. Bilateral breath sounds are clear and equal in all lobes with no wheezes, rales or  rhonchi.. Cardiovascular Heart rhythm and rate regular, without murmur or gallop.. Femoral arteries without bruits and pulses strong.. Pedal pulses palpable and strong bilaterally.. Lymphatic none palpable in the popliteal or inguinal area. Integumentary (Hair, Skin) no primary skin issues are seen.  Psychiatric No evidence of depression, anxiety, or agitation. Calm, cooperative, and communicative. Appropriate interactions and affect.. Notes wound exam; the patient has a very small area on the right lateral calcaneus. She is obviously had some epithelialization here. There is still some depth to this area perhaps 2-3 mm and not a very viable looking surface. there is no surrounding erythema. Electronic Signature(s) Signed: 10/12/2017 6:13:38 PM By: Baltazar Najjar MD Entered By: Baltazar Najjar on 10/12/2017 13:57:02 Angela Alvarez ([id number]) -------------------------------------------------------------------------------- Physician Orders Details Patient Name: Angela Alvarez Date of Service: 10/12/2017 12:30 PM Medical Record Number: [id number] Patient Account Number: [id number] Date of Birth/Sex: 1955/05/10 (62 y.o. Female) Treating RN: Renne Crigler Primary Care Provider: Pearson Grippe Other Clinician: Referring Provider: Pearson Grippe Treating Provider/Extender: Altamese [place] in Treatment: 0 Verbal / Phone Orders: No Diagnosis Coding Wound Cleansing Wound #1 Right Calcaneus o Clean wound with Normal Saline. Anesthetic (add to Medication List) Wound #1 Right Calcaneus o Topical Lidocaine 4% cream applied to wound bed prior to debridement (In Clinic Only). Primary Wound Dressing Wound #1 Right Calcaneus o Iodoflex Secondary Dressing Wound #1 Right Calcaneus o Boardered Foam Dressing Dressing Change Frequency Wound #1 Right Calcaneus o Change dressing every other day. Follow-up Appointments Wound #1 Right Calcaneus o Return Appointment in 1  week. Electronic Signature(s) Signed: 10/12/2017 5:14:04 PM By: Renne Crigler Signed: 10/12/2017 6:13:38 PM By: Baltazar Najjar MD Entered By: Renne Crigler on 10/12/2017 13:24:33 Angela Alvarez ([id number]) -------------------------------------------------------------------------------- Problem List Details Patient Name: Angela Alvarez Date of Service: 10/12/2017 12:30 PM Medical Record Number: [id number] Patient Account Number: [id number] Date of Birth/Sex: 03-03-1956 (62 y.o. Female) Treating RN: Renne Crigler Primary Care Provider: Pearson Grippe Other Clinician: Referring Provider: Pearson Grippe Treating Provider/Extender: Altamese [place] in Treatment: 0 Active Problems ICD-10 Evaluated Encounter Code Description Active Date Today Diagnosis L89.613 Pressure ulcer of right heel, stage 3 10/12/2017 Yes Yes Status Complications Interventions Other new patient. Small but punched out area on the lateral right primary dressing is heel right at the plantar base of the heel but not obviously a Iodoflex. We did not weightbearing surface. 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 She had a right first be taking aspirin and toe amputation in February 2017. Angiogram on 04/28/15 Plavix although I showed atheromatous area within the origin of the right don't think she is Medical common iliac artery. She also had a 75-80% mid left SFA taking either. I Decision stenosis two-vessel runoff and an occluded anterior tibial encouraged her to Making : finally a right mid SFA stenosis with an occluded anterior go back on  the tibial. She was not felt to have a viable endovascular aspirin at least. She surgical option. She followed with Dr. Darrick Penna has vascular studies tomorrow Inactive Problems Resolved Problems Electronic Signature(s) Signed: 10/12/2017 6:13:38 PM By: Baltazar Najjar MD Entered By: Baltazar Najjar on 10/12/2017 13:50:58 Angela Alvarez ([id number]) -------------------------------------------------------------------------------- Progress Note Details Patient Name: Angela Alvarez Date of Service: 10/12/2017 12:30 PM Medical Record Number: [id number] Patient Account Number: [id number] Date of Birth/Sex: 10/10/1955 (62 y.o. Female) Treating RN: Renne Crigler Primary Care Provider: Pearson Grippe Other Clinician: Referring Provider: Pearson Grippe Treating Provider/Extender: Altamese [place]  in Treatment: 0 Subjective Chief Complaint Information obtained from Patient 10/12/17. Patient is here for review of wound on the right lateral heel History of Present Illness (HPI) ADMISSION 10/12/17 This is a 62 year old woman who is here for review of wound on the right lateral calcaneus close to the weightbearing surface of the heel. She is not a diabetic. She has a complicated vascular history initially presenting to Dr. Gery Pray in 2017 with a left great toe discoloration. 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 She also had a 75-80% fairly focal mid left SFA stenosis with 2 vessel runoff and occluded anterior tibial. On the right a 90% fairly focal mid SFA stenosis with an occluded anterior tibial artery widely patent posterior tibial as well as dorsalis pedis arch. She was not felt to have a viable endovascular or surgical options. She was started on aspirin and Plavix as well as statins. The patient has not really been taking aspirin and stop the Plavix recently because of skin  bruising. Last arterial studies on 07/01/16 showed triphasic waveforms at the right posterior tibial and biphasic at the dorsalis pedis ABI of 0.95 on the right. Currently she states that in the 5-6 months she developed a callus in the area of the wound and eventually this came off but she was left with an open sore. She has been putting topical antibiotics on this herself and indeed there appears to have already been a lot of healing. She has been vigorously offloading this using sandals rather than anything that might rub on the wound. Wound History Patient presents with 1 open wound that has been present for approximately 5 weeks. Patient has been treating wound in the following manner: neosporin. Laboratory tests have not been performed in the last month. Patient reportedly has not tested positive for an antibiotic resistant organism. Patient reportedly has not tested positive for osteomyelitis. Patient reportedly has had testing performed to evaluate circulation in the legs. Patient History Information obtained from Patient. Allergies Augmentin Family History Cancer - Siblings,Father, Heart Disease - Mother, Hypertension - Mother, Lung Disease - Father, Stroke - Mother, No family history of Diabetes, Hereditary Spherocytosis, Kidney Disease, Seizures, Thyroid Problems, Tuberculosis. Social History Former smoker - 2.5 years, Marital Status - Single, Alcohol Use - Never, Drug Use - No History, Caffeine Use - Daily. MEYGAN, KYSER ([id number]) Medical History Eyes Denies history of Cataracts, Glaucoma, Optic Neuritis Ear/Nose/Mouth/Throat Denies history of Chronic sinus problems/congestion, Middle ear problems Hematologic/Lymphatic Denies history of Anemia, Hemophilia, Human Immunodeficiency Virus, Lymphedema, Sickle Cell Disease Respiratory Patient has history of Chronic Obstructive Pulmonary Disease (COPD) Denies history of Aspiration, Asthma, Pneumothorax, Sleep Apnea,  Tuberculosis Cardiovascular Denies history of Angina, Arrhythmia, Congestive Heart Failure, Coronary Artery Disease, Deep Vein Thrombosis, Hypertension, Hypotension, Myocardial Infarction, Peripheral Arterial Disease, Peripheral Venous Disease, Phlebitis, Vasculitis Gastrointestinal Denies history of Cirrhosis , Colitis, Crohn s, Hepatitis A, Hepatitis B, Hepatitis C Endocrine Denies history of Type I Diabetes, Type II Diabetes Genitourinary Denies history of End Stage Renal Disease Immunological Denies history of Lupus Erythematosus, Raynaud s, Scleroderma Integumentary (Skin) Denies history of History of Burn, History of pressure wounds Musculoskeletal Patient has history of Osteoarthritis Denies history of Gout, Rheumatoid Arthritis, Osteomyelitis Neurologic Denies history of Dementia, Neuropathy, Quadriplegia, Paraplegia, Seizure Disorder Oncologic Denies history of Received Chemotherapy, Received Radiation Psychiatric Denies history of Anorexia/bulimia, Confinement Anxiety Medical And Surgical History Notes Cardiovascular hypercholesterol Integumentary (Skin) history  of gangrene with no wound on right great toe with amputation of that toe, eczema Musculoskeletal prior right great toe amputation Review of Systems (ROS) Constitutional Symptoms (General Health) The patient has no complaints or symptoms. Eyes Complains or has symptoms of Glasses / Contacts - glasses. Denies complaints or symptoms of Dry Eyes, Vision Changes. Ear/Nose/Mouth/Throat Denies complaints or symptoms of Difficult clearing ears, Sinusitis. Hematologic/Lymphatic Denies complaints or symptoms of Bleeding / Clotting Disorders, Human Immunodeficiency Virus. Respiratory Denies complaints or symptoms of Chronic or frequent coughs, Shortness of Breath. Cardiovascular Denies complaints or symptoms of Chest pain, LE edema. Gastrointestinal Denies complaints or symptoms of Frequent diarrhea, Nausea,  Vomiting. Endocrine Denies complaints or symptoms of Hepatitis, Thyroid disease, Polydypsia (Excessive Thirst). RIELYNN, TRULSON ([id number]) Genitourinary Denies complaints or symptoms of Kidney failure/ Dialysis, Incontinence/dribbling. Immunological Denies complaints or symptoms of Hives, Itching. Integumentary (Skin) Denies complaints or symptoms of Wounds, Bleeding or bruising tendency, Breakdown, Swelling. Musculoskeletal Denies complaints or symptoms of Muscle Pain, Muscle Weakness. Neurologic Denies complaints or symptoms of Numbness/parasthesias. Psychiatric Denies complaints or symptoms of Anxiety, Claustrophobia. Objective Constitutional Patient is hypertensive.. Pulse regular and within target range for patient.Marland Kitchen Respirations regular, non-labored and within target range.. Temperature is normal and within the target range for the patient.Marland Kitchen appears in no distress. Vitals Time Taken: 12:50 PM, Height: 66 in, Source: Measured, Weight: 187 lbs, Source: Measured, BMI: 30.2, Temperature: 98.2 F, Pulse: 74 bpm, Respiratory Rate: 16 breaths/min, Blood Pressure: 158/68 mmHg. Eyes Conjunctivae clear. No discharge. Respiratory Respiratory effort is easy and symmetric bilaterally. Rate is normal at rest and on room air.. Bilateral breath sounds are clear and equal in all lobes with no wheezes, rales or rhonchi.. Cardiovascular Heart rhythm and rate regular, without murmur or gallop.. Femoral arteries without bruits and pulses strong.. Pedal pulses palpable and strong bilaterally.. Lymphatic none palpable in the popliteal or inguinal area. Psychiatric No evidence of depression, anxiety, or agitation. Calm, cooperative, and communicative. Appropriate interactions and affect.. General Notes: wound exam; the patient has a very small area on the right lateral calcaneus. She is obviously had some epithelialization here. There is still some depth to this area perhaps 2-3 mm and not a  very viable looking surface. there is no surrounding erythema. Integumentary (Hair, Skin) no primary skin issues are seen. Wound #1 status is Open. Original cause of wound was Gradually Appeared. The wound is located on the Right Calcaneus. The wound measures 0.2cm length x 0.3cm width x 0.1cm depth; 0.047cm^2 area and 0.005cm^3 volume. There is no tunneling or undermining noted. There is a medium amount of serous drainage noted. The wound margin is flat and intact. Comer, Danylah ([id number]) There is medium (34-66%) pink granulation within the wound bed. There is a medium (34-66%) amount of necrotic tissue within the wound bed including Adherent Slough. The periwound skin appearance exhibited: Excoriation, Maceration. The periwound skin appearance did not exhibit: Callus, Crepitus, Induration, Rash, Scarring, Dry/Scaly, Atrophie Blanche, Cyanosis, Ecchymosis, Hemosiderin Staining, Mottled, Pallor, Rubor, Erythema. Periwound temperature was noted as No Abnormality. The periwound has tenderness on palpation. 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 Anesthetic (add to Medication List): Wound #1 Right Calcaneus: Topical Lidocaine 4% cream applied to wound bed prior to debridement (In Clinic Only). Primary Wound Dressing: Wound #1 Right Calcaneus: Iodoflex Secondary Dressing: Wound #1 Right Calcaneus: Boardered Foam Dressing  Dressing Change Frequency: Wound #1 Right Calcaneus: Change dressing every other day. Follow-up Appointments: Wound #1 Right Calcaneus: Return Appointment in 1 week. Medical Decision Making Pressure ulcer of right heel, stage 3 10/12/2017 Status: Other Complications: new patient. Small but punched out area on the lateral right heel right at the plantar base of  the heel but not obviously a weightbearing surface. Interventions: primary dressing is Iodoflex. 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 Angiogram on 04/28/15 showed atheromatous area within the origin of the right common iliac artery. She also had a 75-80% mid left SFA stenosis two-vessel runoff and an occluded anterior tibial finally a right mid SFA stenosis with an occluded anterior tibial. She was not felt to have a viable endovascular surgical option. She followed with Dr. Darrick Penna Interventions: she is supposed to be taking aspirin and Plavix although I don't think she is taking either. I encouraged her to go back on the aspirin at least. She has vascular studies tomorrow TYLISA, ALCIVAR ([id number]) #1 use Iodoflex which I think she should be able to manipulate into the small hole to see if we can get debridement and closure # 2I didn't have much more in the clinic to offload this area than the sandals the patient already had #3 I don't think this issue has anything to do with the known peripheral arterial disease she has. I did encourage her to go back on aspirin. She has follow-up arterial studies/noninvasive arterial studies in [facility] tomorrow. These are no doubt to compare with the ones quoted above in March 2018. Electronic Signature(s) Signed: 10/12/2017 6:13:38 PM By: Baltazar Najjar MD Entered By: Baltazar Najjar on 10/12/2017 13:58:33 Angela Alvarez ([id number]) -------------------------------------------------------------------------------- ROS/PFSH Details Patient Name: Angela Alvarez Date of Service: 10/12/2017 12:30  PM Medical Record Number: [id number] Patient Account Number: [id number] Date of Birth/Sex: [birth year] (62 y.o. Female) Treating RN: Curtis Sites Primary Care Provider: Pearson Grippe Other Clinician: Referring Provider: Pearson Grippe Treating Provider/Extender: Altamese [place] in Treatment: 0 Information Obtained From Patient Wound History Do you currently have one or more open woundso Yes How many open wounds do you currently haveo 1 Approximately how long have you had your woundso 5 weeks How have you been treating your wound(s) until nowo neosporin Has your wound(s) ever healed and then re-openedo No Have you had any lab work done in the past montho No Have you tested positive for an antibiotic resistant organism (MRSA, VRE)o No Have you tested positive for osteomyelitis (bone infection)o No Have you had any tests for circulation on your legso Yes Who ordered the testo PCP Where was the test doneo 07/01/16 at [facility] Constitutional Symptoms (General Health) Complaints and Symptoms: No Complaints or Symptoms Complaints and Symptoms: Negative for: Fatigue; Fever; Chills; Marked Weight Change Eyes Complaints and Symptoms: Positive for: Glasses / Contacts - glasses Negative for: Dry Eyes; Vision Changes Medical History: Negative for: Cataracts; Glaucoma; Optic Neuritis Ear/Nose/Mouth/Throat Complaints and Symptoms: Negative for: Difficult clearing ears; Sinusitis Medical History: Negative for: Chronic sinus problems/congestion; Middle ear problems Hematologic/Lymphatic Complaints and Symptoms: Negative for: Bleeding / Clotting Disorders; Human Immunodeficiency Virus Medical History: Negative for: Anemia; Hemophilia; Human Immunodeficiency Virus; Lymphedema; Sickle  Cell Disease Respiratory Bogan, Jaquelinne ([id number]) Complaints and Symptoms: Negative for: Chronic or frequent coughs; Shortness of Breath Medical History: Positive for: Chronic Obstructive Pulmonary  Disease (COPD) Negative for: Aspiration; Asthma; Pneumothorax; Sleep Apnea; Tuberculosis Cardiovascular Complaints and Symptoms: Negative for: Chest pain; LE edema Medical History: Negative for: Angina; Arrhythmia; Congestive Heart Failure; Coronary Artery Disease; Deep Vein Thrombosis; Hypertension; Hypotension; Myocardial Infarction; Peripheral Arterial Disease; Peripheral Venous Disease; Phlebitis; Vasculitis Past Medical History Notes: hypercholesterol Gastrointestinal Complaints and Symptoms: Negative for: Frequent diarrhea; Nausea; Vomiting Medical History: Negative for: Cirrhosis ; Colitis; Crohnos; Hepatitis A; Hepatitis B; Hepatitis C Endocrine Complaints and Symptoms: Negative for: Hepatitis; Thyroid disease; Polydypsia (Excessive Thirst) Medical History: Negative for: Type I Diabetes; Type II Diabetes Genitourinary Complaints and Symptoms: Negative for: Kidney failure/ Dialysis; Incontinence/dribbling Medical History: Negative for: End Stage Renal Disease Immunological Complaints and Symptoms: Negative for: Hives; Itching Medical History: Negative for: Lupus Erythematosus; Raynaudos; Scleroderma Integumentary (Skin) Complaints and Symptoms: Negative for: Wounds; Bleeding or bruising tendency; Breakdown; Swelling Medical History: Negative for: History of Burn; History of pressure wounds Past Medical History NotesReino Alvarez: Bettinger, Marcayla ([id number]) history of gangrene with no wound on right great toe with amputation of that toe, eczema Musculoskeletal Complaints and Symptoms: Negative for: Muscle Pain; Muscle Weakness Medical History: Positive for: Osteoarthritis Negative for: Gout; Rheumatoid Arthritis; Osteomyelitis Past Medical History Notes: prior right great toe amputation Neurologic Complaints and Symptoms: Negative for: Numbness/parasthesias Medical History: Negative for: Dementia; Neuropathy; Quadriplegia; Paraplegia; Seizure  Disorder Psychiatric Complaints and Symptoms: Negative for: Anxiety; Claustrophobia Medical History: Negative for: Anorexia/bulimia; Confinement Anxiety Oncologic Medical History: Negative for: Received Chemotherapy; Received Radiation Immunizations Pneumococcal Vaccine: Received Pneumococcal Vaccination: Yes Immunization Notes: up to date Implantable Devices Family and Social History Cancer: Yes - Siblings,Father; Diabetes: No; Heart Disease: Yes - Mother; Hereditary Spherocytosis: No; Hypertension: Yes - Mother; Kidney Disease: No; Lung Disease: Yes - Father; Seizures: No; Stroke: Yes - Mother; Thyroid Problems: No; Tuberculosis: No; Former smoker - 2.5 years; Marital Status - Single; Alcohol Use: Never; Drug Use: No History; Caffeine Use: Daily; Financial Concerns: No; Food, Clothing or Shelter Needs: No; Support System Lacking: No; Transportation Concerns: No; Advanced Directives: No; Patient does not want information on Advanced Directives Electronic Signature(s) Signed: 10/12/2017 5:21:12 PM By: Curtis Sitesorthy, Joanna Signed: 10/12/2017 6:13:38 PM By: Baltazar Najjarobson, Michael MD Entered By: Curtis Sitesorthy, Joanna on 10/12/2017 12:49:05 Angela KentWIGGINS, Aaliah ([id number]) -------------------------------------------------------------------------------- SuperBill Details Patient Name: Angela KentWIGGINS, Aiva Date of Service: 10/12/2017 Medical Record Number: [id number] Patient Account Number: [id number] Date of Birth/Sex: 10-05-1955 (62 y.o. 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

## 2017-10-13 NOTE — Progress Notes (Signed)
AUDRIONNA, LAMPTON ([id number]) Visit Report for 10/12/2017 Allergy List Details Patient Name: Angela Alvarez, Angela Alvarez Date of Service: 10/12/2017 12:30 PM Medical Record Number: [id number] Patient Account Number: [id number] Date of Birth/Sex: 07-31-1955 (62 y.o. Female) Treating RN: Curtis Sites Primary Care Syler Norcia: Pearson Grippe Other Clinician: Referring Charle Clear: Pearson Grippe Treating Naysha Sholl/Extender: Maxwell Caul Weeks in Treatment: 0 Allergies Active Allergies Augmentin Allergy Notes Electronic Signature(s) Signed: 10/12/2017 5:21:12 PM By: Curtis Sites Entered By: Curtis Sites on 10/12/2017 12:42:42 Angela Alvarez ([id number]) -------------------------------------------------------------------------------- Arrival Information Details Patient Name: Angela Alvarez Date of Service: 10/12/2017 12:30 PM Medical Record Number: [id number] Patient Account Number: [id number] Date of Birth/Sex: May 20, 1955 (62 y.o. Female) Treating RN: Curtis Sites Primary Care Shanina Kepple: Pearson Grippe Other Clinician: Referring Lilliona Blakeney: Pearson Grippe Treating Syrah Daughtrey/Extender: Altamese [place] in Treatment: 0 Visit Information Patient Arrived: Ambulatory Arrival Time: 12:37 Accompanied By: self Transfer Assistance: None Patient Identification Verified: Yes Secondary Verification Process Yes Completed: Patient Has Alerts: Yes Patient Alerts: Patient on Blood Thinner aspirin 325 ABI R-0.79 on 07/01/16 ABI L-1.01 on 07/01/2016 Electronic Signature(s) Signed: 10/12/2017 5:14:04 PM By: Renne Crigler Entered By: Renne Crigler on 10/12/2017 13:22:54 Angela Alvarez ([id number]) -------------------------------------------------------------------------------- Clinic Level of Care Assessment Details Patient Name: Angela Alvarez Date of Service: 10/12/2017 12:30 PM Medical Record Number: [id number] Patient Account Number: [id number] Date of Birth/Sex: 10-16-1955 (62 y.o.  Female) Treating RN: Renne Crigler Primary Care Matika Bartell: Pearson Grippe Other Clinician: Referring Quasean Frye: Pearson Grippe Treating Allicia Culley/Extender: Altamese [place] in Treatment: 0 Clinic Level of Care Assessment Items TOOL 2 Quantity Score X - Use when only an EandM is performed on the INITIAL visit 1 0 ASSESSMENTS - Nursing Assessment / Reassessment X - General Physical Exam (combine w/ comprehensive assessment (listed just below) when 1 20 performed on new pt. evals) X- 1 25 Comprehensive Assessment (HX, ROS, Risk Assessments, Wounds Hx, etc.) ASSESSMENTS - Wound and Skin Assessment / Reassessment X - Simple Wound Assessment / Reassessment - one wound 1 5 []  - 0 Complex Wound Assessment / Reassessment - multiple wounds []  - 0 Dermatologic / Skin Assessment (not related to wound area) ASSESSMENTS - Ostomy and/or Continence Assessment and Care []  - Incontinence Assessment and Management 0 []  - 0 Ostomy Care Assessment and Management (repouching, etc.) PROCESS - Coordination of Care X - Simple Patient / Family Education for ongoing care 1 15 []  - 0 Complex (extensive) Patient / Family Education for ongoing care []  - 0 Staff obtains Chiropractor, Records, Test Results / Process Orders []  - 0 Staff telephones HHA, Nursing Homes / Clarify orders / etc []  - 0 Routine Transfer to another Facility (non-emergent condition) []  - 0 Routine Hospital Admission (non-emergent condition) []  - 0 New Admissions / Manufacturing engineer / Ordering NPWT, Apligraf, etc. []  - 0 Emergency Hospital Admission (emergent condition) X- 1 10 Simple Discharge Coordination []  - 0 Complex (extensive) Discharge Coordination PROCESS - Special Needs []  - Pediatric / Minor Patient Management 0 []  - 0 Isolation Patient Management KIELE, HEAVRIN ([id number]) []  - 0 Hearing / Language / Visual special needs []  - 0 Assessment of Community assistance (transportation, D/C planning, etc.) []  -  0 Additional assistance / Altered mentation []  - 0 Support Surface(s) Assessment (bed, cushion, seat, etc.) INTERVENTIONS - Wound Cleansing / Measurement X - Wound Imaging (photographs - any number of wounds) 1 5 []  - 0 Wound Tracing (instead of photographs) X- 1 5 Simple Wound Measurement - one wound []  - 0 Complex Wound Measurement - multiple wounds X- 1  5 Simple Wound Cleansing - one wound []  - 0 Complex Wound Cleansing - multiple wounds INTERVENTIONS - Wound Dressings X - Small Wound Dressing one or multiple wounds 1 10 []  - 0 Medium Wound Dressing one or multiple wounds []  - 0 Large Wound Dressing one or multiple wounds []  - 0 Application of Medications - injection INTERVENTIONS - Miscellaneous []  - External ear exam 0 []  - 0 Specimen Collection (cultures, biopsies, blood, body fluids, etc.) []  - 0 Specimen(s) / Culture(s) sent or taken to Lab for analysis []  - 0 Patient Transfer (multiple staff / Nurse, adultHoyer Lift / Similar devices) []  - 0 Simple Staple / Suture removal (25 or less) []  - 0 Complex Staple / Suture removal (26 or more) []  - 0 Hypo / Hyperglycemic Management (close monitor of Blood Glucose) []  - 0 Ankle / Brachial Index (ABI) - do not check if billed separately Has the patient been seen at the hospital within the last three years: Yes Total Score: 100 Level Of Care: New/Established - Level 3 Electronic Signature(s) Signed: 10/12/2017 5:14:04 PM By: Renne CriglerFlinchum, Cheryl Entered By: Renne CriglerFlinchum, Cheryl on 10/12/2017 13:25:13 Angela KentWIGGINS, Angela Alvarez ([id number]) -------------------------------------------------------------------------------- Encounter Discharge Information Details Patient Name: Angela KentWIGGINS, Angela Alvarez Date of Service: 10/12/2017 12:30 PM Medical Record Number: [id number] Patient Account Number: [id number] Date of Birth/Sex: [birth year] (62 y.o. Female) Treating RN: Phillis HaggisPinkerton, Debi Primary Care Devorah Givhan: Pearson GrippeKIM, JAMES Other Clinician: Referring Anaaya Fuster: Pearson GrippeKIM,  JAMES Treating Kamir Selover/Extender: Altamese CarolinaOBSON, MICHAEL G Weeks in Treatment: 0 Encounter Discharge Information Items Discharge Condition: Stable Ambulatory Status: Ambulatory Discharge Destination: Home Transportation: Private Auto Accompanied By: self Schedule Follow-up Appointment: Yes Clinical Summary of Care: Electronic Signature(s) Signed: 10/12/2017 5:25:23 PM By: Alejandro MullingPinkerton, Debra Entered By: Alejandro MullingPinkerton, Debra on 10/12/2017 13:32:48 Angela KentWIGGINS, Angela Alvarez ([id number]) -------------------------------------------------------------------------------- Lower Extremity Assessment Details Patient Name: Angela KentWIGGINS, Angela Alvarez Date of Service: 10/12/2017 12:30 PM Medical Record Number: [id number] Patient Account Number: [id number] Date of Birth/Sex: [birth year] (62 y.o. Female) Treating RN: Curtis Sitesorthy, Joanna Primary Care Enedelia Martorelli: Pearson GrippeKIM, JAMES Other Clinician: Referring Kamuela Magos: Pearson GrippeKIM, JAMES Treating Giovannie Scerbo/Extender: Altamese CarolinaOBSON, MICHAEL G Weeks in Treatment: 0 Vascular Assessment Pulses: Dorsalis Pedis Palpable: [Left:Yes] [Right:Yes] Doppler Audible: [Left:Yes] [Right:Yes] Posterior Tibial Palpable: [Left:Yes] [Right:Yes] Doppler Audible: [Left:Yes] [Right:Yes] Extremity colors, hair growth, and conditions: Extremity Color: [Left:Normal] [Right:Normal] Hair Growth on Extremity: [Left:No] [Right:No] Temperature of Extremity: [Left:Warm] [Right:Warm] Capillary Refill: [Left:< 3 seconds] [Right:< 3 seconds] Toe Nail Assessment Left: Right: Thick: Yes Discolored: No Deformed: No Improper Length and Hygiene: No Notes ABI scheduled with Dr Darrick PennaFields tomorrow and ABI from 07/01/16 R .79 L 1.01 Electronic Signature(s) Signed: 10/12/2017 5:21:12 PM By: Curtis Sitesorthy, Joanna Entered By: Curtis Sitesorthy, Joanna on 10/12/2017 12:59:07 Angela KentWIGGINS, Enya ([id number]) -------------------------------------------------------------------------------- Multi Wound Chart Details Patient Name: Angela KentWIGGINS, Angela Alvarez Date of Service:  10/12/2017 12:30 PM Medical Record Number: [id number] Patient Account Number: [id number] Date of Birth/Sex: [birth year] (62 y.o. Female) Treating RN: Renne CriglerFlinchum, Cheryl Primary Care Zera Markwardt: Pearson GrippeKIM, JAMES Other Clinician: Referring Raymir Frommelt: Pearson GrippeKIM, JAMES Treating Ellery Meroney/Extender: Altamese CarolinaOBSON, MICHAEL G Weeks in Treatment: 0 Vital Signs Height(in): 66 Pulse(bpm): 74 Weight(lbs): 187 Blood Pressure(mmHg): 158/68 Body Mass Index(BMI): 30 Temperature(F): 98.2 Respiratory Rate 16 (breaths/min): Photos: [1:No Photos] [N/A:N/A] Wound Location: [1:Right Calcaneus] [N/A:N/A] Wounding Event: [1:Gradually Appeared] [N/A:N/A] Primary Etiology: [1:To be determined] [N/A:N/A] Comorbid History: [1:Chronic Obstructive Pulmonary Disease (COPD), Osteoarthritis] [N/A:N/A] Date Acquired: [1:08/29/2017] [N/A:N/A] Weeks of Treatment: [1:0] [N/A:N/A] Wound Status: [1:Open] [N/A:N/A] Measurements L x W x D [1:0.2x0.3x0.1] [N/A:N/A] (cm) Area (cm) : [1:0.047] [N/A:N/A] Volume (cm) : [1:0.005] [N/A:N/A] Classification: [1:Full Thickness Without Exposed Support Structures] [N/A:N/A] Exudate Amount: [1:Medium] [  N/A:N/A] Exudate Type: [1:Serous] [N/A:N/A] Exudate Color: [1:amber] [N/A:N/A] Wound Margin: [1:Flat and Intact] [N/A:N/A] Granulation Amount: [1:Medium (34-66%)] [N/A:N/A] Granulation Quality: [1:Pink] [N/A:N/A] Necrotic Amount: [1:Medium (34-66%)] [N/A:N/A] Exposed Structures: [1:Fascia: No Fat Layer (Subcutaneous Tissue) Exposed: No Tendon: No Muscle: No Joint: No Bone: No] [N/A:N/A] Epithelialization: [1:None] [N/A:N/A] Periwound Skin Texture: [1:Excoriation: Yes Induration: No Callus: No Crepitus: No Rash: No Scarring: No] [N/A:N/A] Periwound Skin Moisture: Maceration: Yes N/A N/A Dry/Scaly: No Periwound Skin Color: Atrophie Blanche: No N/A N/A Cyanosis: No Ecchymosis: No Erythema: No Hemosiderin Staining: No Mottled: No Pallor: No Rubor: No Temperature: No Abnormality N/A  N/A Tenderness on Palpation: Yes N/A N/A Wound Preparation: Ulcer Cleansing: N/A N/A Rinsed/Irrigated with Saline Topical Anesthetic Applied: Other: lidocaine 4% Treatment Notes Wound #1 (Right Calcaneus) 1. Cleansed with: Clean wound with Normal Saline 2. Anesthetic Topical Lidocaine 4% cream to wound bed prior to debridement 4. Dressing Applied: Iodoflex 5. Secondary Dressing Applied Bordered Foam Dressing Electronic Signature(s) Signed: 10/12/2017 6:13:38 PM By: Baltazar Najjar MD Entered By: Baltazar Najjar on 10/12/2017 13:51:11 Angela Alvarez ([id number]) -------------------------------------------------------------------------------- Multi-Disciplinary Care Plan Details Patient Name: Angela Alvarez Date of Service: 10/12/2017 12:30 PM Medical Record Number: [id number] Patient Account Number: [id number] Date of Birth/Sex: April 04, 1956 (62 y.o. Female) Treating RN: Renne Crigler Primary Care Kinslei Labine: Pearson Grippe Other Clinician: Referring Kendrew Paci: Pearson Grippe Treating Sherita Decoste/Extender: Altamese [place] in Treatment: 0 Active Inactive ` Orientation to the Wound Care Program Nursing Diagnoses: Knowledge deficit related to the wound healing center program Goals: Patient/caregiver will verbalize understanding of the Wound Healing Center Program Date Initiated: 10/12/2017 Target Resolution Date: 11/09/2017 Goal Status: Active Interventions: Provide education on orientation to the wound center Notes: ` Wound/Skin Impairment Nursing Diagnoses: Impaired tissue integrity Goals: Patient/caregiver will verbalize understanding of skin care regimen Date Initiated: 10/12/2017 Target Resolution Date: 11/09/2017 Goal Status: Active Ulcer/skin breakdown will have a volume reduction of 30% by week 4 Date Initiated: 10/12/2017 Target Resolution Date: 11/09/2017 Goal Status: Active Interventions: Assess patient/caregiver ability to obtain necessary supplies Assess  patient/caregiver ability to perform ulcer/skin care regimen upon admission and as needed Assess ulceration(s) every visit Treatment Activities: Skin care regimen initiated : 10/12/2017 Notes: Electronic Signature(s) Signed: 10/12/2017 5:14:04 PM By: Roe Coombs, Ramesha ([id number]) Entered By: Renne Crigler on 10/12/2017 13:16:07 Angela Alvarez ([id number]) -------------------------------------------------------------------------------- Pain Assessment Details Patient Name: Angela Alvarez Date of Service: 10/12/2017 12:30 PM Medical Record Number: [id number] Patient Account Number: [id number] Date of Birth/Sex: [birth year] (62 y.o. Female) Treating RN: Curtis Sites Primary Care Oluwateniola Leitch: Pearson Grippe Other Clinician: Referring Brandy Zuba: Pearson Grippe Treating Tyera Hansley/Extender: Altamese [place] in Treatment: 0 Active Problems Location of Pain Severity and Description of Pain Patient Has Paino No Site Locations Pain Management and Medication Current Pain Management: Electronic Signature(s) Signed: 10/12/2017 5:21:12 PM By: Curtis Sites Entered By: Curtis Sites on 10/12/2017 12:38:42 Angela Alvarez ([id number]) -------------------------------------------------------------------------------- Patient/Caregiver Education Details Patient Name: Angela Alvarez Date of Service: 10/12/2017 12:30 PM Medical Record Number: [id number] Patient Account Number: [id number] Date of Birth/Gender: January 04, 1956 (62 y.o. Female) Treating RN: Phillis Haggis Primary Care Physician: Pearson Grippe Other Clinician: Referring Physician: Pearson Grippe Treating Physician/Extender: Altamese [place] in Treatment: 0 Education Assessment Education Provided To: Patient Education Topics Provided Wound/Skin Impairment: Handouts: Caring for Your Ulcer, Skin Care Do's and Dont's, Other: change dressing as ordered Methods: Demonstration, Explain/Verbal Responses: State  content correctly Electronic Signature(s) Signed: 10/12/2017 5:25:23 PM By: Alejandro Mulling Entered By: Alejandro Mulling on 10/12/2017 13:33:07 Angela Alvarez ([id number]) -------------------------------------------------------------------------------- Wound Assessment Details Patient  Name: Angela Alvarez, Angela Alvarez Date of Service: 10/12/2017 12:30 PM Medical Record Number: [id number] Patient Account Number: [id number] Date of Birth/Sex: 1955-05-16 (62 y.o. Female) Treating RN: Curtis Sites Primary Care Loana Salvaggio: Pearson Grippe Other Clinician: Referring Aaniyah Strohm: Pearson Grippe Treating Gohan Collister/Extender: Altamese [place] in Treatment: 0 Wound Status Wound Number: 1 Primary To be determined Etiology: Wound Location: Right Calcaneus Wound Status: Open Wounding Event: Gradually Appeared Comorbid Chronic Obstructive Pulmonary Disease Date Acquired: 08/29/2017 History: (COPD), Osteoarthritis Weeks Of Treatment: 0 Clustered Wound: No Photos Photo Uploaded By: Curtis Sites on 10/13/2017 09:58:44 Wound Measurements Length: (cm) 0.2 Width: (cm) 0.3 Depth: (cm) 0.1 Area: (cm) 0.047 Volume: (cm) 0.005 % Reduction in Area: % Reduction in Volume: Epithelialization: None Tunneling: No Undermining: No Wound Description Full Thickness Without Exposed Support Classification: Structures Wound Margin: Flat and Intact Exudate Medium Amount: Exudate Type: Serous Exudate Color: amber Foul Odor After Cleansing: No Slough/Fibrino Yes Wound Bed Granulation Amount: Medium (34-66%) Exposed Structure Granulation Quality: Pink Fascia Exposed: No Necrotic Amount: Medium (34-66%) Fat Layer (Subcutaneous Tissue) Exposed: No Necrotic Quality: Adherent Slough Tendon Exposed: No Muscle Exposed: No Joint Exposed: No Bone Exposed: No Angela Alvarez, Angela Alvarez ([id number]) Periwound Skin Texture Texture Color No Abnormalities Noted: No No Abnormalities Noted: No Callus: No Atrophie Blanche:  No Crepitus: No Cyanosis: No Excoriation: Yes Ecchymosis: No Induration: No Erythema: No Rash: No Hemosiderin Staining: No Scarring: No Mottled: No Pallor: No Moisture Rubor: No No Abnormalities Noted: No Dry / Scaly: No Temperature / Pain Maceration: Yes Temperature: No Abnormality Tenderness on Palpation: Yes Wound Preparation Ulcer Cleansing: Rinsed/Irrigated with Saline Topical Anesthetic Applied: Other: lidocaine 4%, Electronic Signature(s) Signed: 10/12/2017 5:21:12 PM By: Curtis Sites Entered By: Curtis Sites on 10/12/2017 12:56:04 Angela Alvarez ([id number]) -------------------------------------------------------------------------------- Vitals Details Patient Name: Angela Alvarez Date of Service: 10/12/2017 12:30 PM Medical Record Number: [id number] Patient Account Number: [id number] Date of Birth/Sex: [birth year] (62 y.o. Female) Treating RN: Curtis Sites Primary Care Blenda Wisecup: Pearson Grippe Other Clinician: Referring Zyler Hyson: Pearson Grippe Treating Fredricka Kohrs/Extender: Altamese [place] in Treatment: 0 Vital Signs Time Taken: 12:50 Temperature (F): 98.2 Height (in): 66 Pulse (bpm): 74 Source: Measured Respiratory Rate (breaths/min): 16 Weight (lbs): 187 Blood Pressure (mmHg): 158/68 Source: Measured Reference Range: 80 - 120 mg / dl Body Mass Index (BMI): 30.2 Electronic Signature(s) Signed: 10/12/2017 5:21:12 PM By: Curtis Sites Entered By: Curtis Sites on 10/12/2017 12:51:49

## 2017-10-13 NOTE — Progress Notes (Signed)
[facility] [place]   CC: Follow up peripheral artery occlusive disease  History of Present Illness Angela Alvarez is a 62 y.o. female who returns for follow-up today. 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 This was documented on CT scan and by angiogram. 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 She has had no problems since then other than some occasional pins and needle sensation in the right first toe bed. She denies any claudication symptoms. She has now been tobacco free since 2017 and quit with the assistance of Chantix. She is on aspirin and Lipitor.  Dr. Darrick Penna last evaluated pt on 07-01-16. At that time patient was doing well, most likely had an atheroembolic event from ruptured plaque in the aorta. This is been asymptomatic and stable for over a year. We have previously discussed whether or not to remove this plaque but since she has now been stable for some time and do not believe he needs any intervention at this point. Plan: The patient was to continue her antiplatelet and statin agent. She was to follow-up with [place] in one year for repeat ABIs.  She states her feet hurt when she walks, and this limits her walking, along with torn meniscus that occurred in December 2018, slid when bowling, is seeing ortho, Dr. Valma Cava. 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   She is being seen at the wound care center at [facility], was there yesterday for healing wound right heel.   She denies any known hx of stroke or TIA. She denies any known heart problems.      Diabetic: No Tobacco use: former smoker, quit in 2017  Pt meds include: Statin :Yes Betablocker: No ASA: Yes, only started taking in February 2019 Other anticoagulants/antiplatelets: no, she stopped Plavix taking after a month's use  after right great toe amputation. She stopped due to excessive bruising. Pt states she told her PCP she stopped the Plavix.   Past Medical History:  Diagnosis Date  . Abnormal Pap smear of cervix 06-01-13   ascus pap:pos.HR HPV/hx of cryotherapy to cx 1985  . Asthma   . COPD (chronic obstructive pulmonary disease) (HCC)   . Critical lower limb ischemia    ischemic appearing left first toe  . Peripheral vascular disease [facility])     Social History Social History   Tobacco Use  . Smoking status: Former Smoker    Packs/day: 1.00    Types: Cigarettes    Last attempt to quit: 06/07/2015    Years since quitting: 2.3  . Smokeless tobacco: Never Used  Substance Use Topics  . Alcohol use: No    Alcohol/week: 0.0 oz  . Drug use: No    Family History Family History  Problem Relation Age of Onset  . Stroke Mother        dec age 5  . Hypertension Mother   . Cancer Father        lung age 50  . Cancer Sister        dec colon ca age 77  . Hypertension Sister   . Diabetes Neg Hx   . Heart attack Neg Hx   . Hyperlipidemia Neg Hx   . Sudden death Neg Hx     Past Surgical History:  Procedure Laterality Date  . AMPUTATION Right 05/19/2015  Procedure: RIGHT FIRST RAY AMPUTATION;  Surgeon: Sherren Kerns, MD;  Location[facility] OR;  Service: Vascular;  Laterality: Right;  . BREAST SURGERY Right   . BUNIONECTOMY     bilateral  . CARDIAC CATHETERIZATION Bilateral    Catarct  . COLONOSCOPY W/ POLYPECTOMY    . KNEE ARTHROPLASTY Right 07/21/2017  . PERIPHERAL VASCULAR CATHETERIZATION N/A 04/28/2015   Procedure: Lower Extremity Angiography;  Surgeon: Runell Gess, MD;  Location: [facility] INVASIVE CV LAB;  Service: Cardiovascular;  Laterality: N/A;  . surgery under arm Right    --benign breast tissue    Allergies  Allergen Reactions  . Amoxicillin-Pot Clavulanate Nausea And Vomiting    NAUSEA AND VOMITING  . Levofloxacin Nausea And Vomiting    Current Outpatient Medications  Medication  Sig Dispense Refill  . albuterol (PROVENTIL,VENTOLIN) 90 MCG/ACT inhaler Inhale 2 puffs into the lungs every 4 (four) hours as needed for wheezing or shortness of breath.     Marland Kitchen aspirin 325 MG tablet Take 325 mg by mouth daily. Reported on 06/12/2015    . atorvastatin (LIPITOR) 40 MG tablet     . budesonide-formoterol (SYMBICORT) 160-4.5 MCG/ACT inhaler Inhale 2 puffs into the lungs 2 (two) times daily.      . clobetasol cream (TEMOVATE) 0.05 % APPLY TOPICALLY TWICE WEEKLY 30 g 0  . clopidogrel (PLAVIX) 75 MG tablet Take 1 tablet (75 mg total) by mouth daily with breakfast. (Patient not taking: Reported on 10/13/2017) 30 tablet 11  . ondansetron (ZOFRAN) 4 MG tablet Take 1 tablet (4 mg total) by mouth every 6 (six) hours. (Patient not taking: Reported on 10/13/2017) 60 tablet 1  . varenicline (CHANTIX) 1 MG tablet Take 1 mg by mouth 2 (two) times daily.     No current facility-administered medications for this visit.     ROS: See HPI for pertinent positives and negatives.   Physical Examination  Vitals:   10/13/17 1152 10/13/17 1154  BP: (!) 158/91 (!) 157/93  Pulse: 75   Resp: 18   Temp: 98.3 F (36.8 C)   TempSrc: Oral   SpO2: 97%   Weight: 187 lb (84.8 kg)   Height: 5\' 6"  (1.676 m)    Body mass index is 30.18 kg/m.  General: A&O x 3, WDWN, obese female. Gait: normal HENT: No gross abnormalities.  Eyes: PERRLA. Pulmonary: Respirations are non labored, CTAB, good air movement in all fields Cardiac: regular rhythm, no detected murmur.         Carotid Bruits Right Left   Negative Negative   Radial pulses are 2+ palpable bilaterally   Adominal aortic pulse is not palpable          Right heel                   VASCULAR EXAM: Extremities without ischemic changes, without Gangrene; with open shallow right lateral/posterior heel wound (see above photo). Right 1st toe is surgically absent.  LE Pulses Right Left       FEMORAL  1+ palpable  2+ palpable        POPLITEAL  no palpable   no palpable       POSTERIOR TIBIAL  2+ palpable   not palpable        DORSALIS PEDIS      ANTERIOR TIBIAL not palpable  2+ palpable    Abdomen: soft, NT, no palpable masses. Skin: no rashes, no cellulitis, no ulcers noted. Musculoskeletal: no muscle wasting or atrophy.  Neurologic: A&O X 3; appropriate affect, Sensation is normal; MOTOR FUNCTION:  moving all extremities equally, motor strength 5/5 throughout. Speech is fluent/normal. CN 2-12 intact. Psychiatric: Thought content is normal, mood appropriate for clinical situation.     ASSESSMENT: Angela KentCatherine Risser is a 62 y.o. 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 This was documented on CT scan and by angiogram. 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 She has had no problems since then other than some occasional pins and needle sensation in the right first toe bed. She denies any claudication symptoms.  There are no signs of ischemia in her feet or legs.   I discussed with Dr. Darrick PennaFields that pt has a healing wound on her right heel, has a 2+ palpable right PT pulse, had a 90% stenosis in right SFA on 2017 angiogram by Dr. Allyson SabalBerry, and some abdominal aortic stenosis, and that pt is attending a wound care center for her healing right heel ulcer .  Since she had excessive bruising with the use of Plavix, does not need to continue taking Plavix (discussed with Dr. Darrick PennaFields).   Continue taking 81 mg ASA daily.   DATA  ABI (Date: 10/13/2017):  R:   ABI: 0.78 (was 0.95 on 07-01-16),   PT: bi  DP: bi  TBI:  S/p great to amputation  L:   ABI: 0.97 (was 1.01),   PT: mono  DP: tri  TBI: 0.70  Decline in right ABI from normal to moderate disease, biphasic waveforms.' Left ABI  remains normal with tri and monophasic waveforms.    PLAN:  Based on the patient's vascular studies and examination, pt will return to clinic in 1 month with ABI's and right heel wound check.   Daily seated leg exercises discussed and demonstrated.   I discussed in depth with the patient the nature of atherosclerosis, and emphasized the importance of maximal medical management including strict control of blood pressure, blood glucose, and lipid levels, obtaining regular exercise, and continued cessation of smoking.  The patient is aware that without maximal medical management the underlying atherosclerotic disease process will progress, limiting the benefit of any interventions.  The patient was given information about PAD including signs, symptoms, treatment, what symptoms should prompt the patient to seek immediate medical care, and risk reduction measures to take.  Charisse MarchSuzanne Jwan Hornbaker, RN, MSN, FNP-C [facility] [place] Office Phone: [phone]  Clinic MD: Darrick PennaFields  10/13/17 12:01 PM

## 2017-10-13 NOTE — Progress Notes (Signed)
Reino KentWIGGINS, Hessie ([id number]) Visit Report for 10/12/2017 Abuse/Suicide Risk Screen Details Patient Name: Reino KentWIGGINS, Aqueelah Date of Service: 10/12/2017 12:30 PM Medical Record Number: [id number] Patient Account Number: [id number] Date of Birth/Sex: 1955/07/24 (62 y.o. Female) Treating RN: Curtis Sitesorthy, Joanna Primary Care Khamryn Calderone: Pearson GrippeKIM, JAMES Other Clinician: Referring Saesha Llerenas: Pearson GrippeKIM, JAMES Treating Ulysees Robarts/Extender: Altamese CarolinaOBSON, MICHAEL G Weeks in Treatment: 0 Abuse/Suicide Risk Screen Items Answer ABUSE/SUICIDE RISK SCREEN: Has anyone close to you tried to hurt or harm you recentlyo No Do you feel uncomfortable with anyone in your familyo No Has anyone forced you do things that you didnot want to doo No Do you have any thoughts of harming yourselfo No Patient displays signs or symptoms of abuse and/or neglect. No Electronic Signature(s) Signed: 10/12/2017 5:21:12 PM By: Curtis Sitesorthy, Joanna Entered By: Curtis Sitesorthy, Joanna on 10/12/2017 12:49:14 Reino KentWIGGINS, Frieda ([id number]) -------------------------------------------------------------------------------- Activities of Daily Living Details Patient Name: Reino KentWIGGINS, Ada Date of Service: 10/12/2017 12:30 PM Medical Record Number: [id number] Patient Account Number: [id number] Date of Birth/Sex: 1955/07/24 (62 y.o. Female) Treating RN: Curtis Sitesorthy, Joanna Primary Care Briseyda Fehr: Pearson GrippeKIM, JAMES Other Clinician: Referring Raymar Joiner: Pearson GrippeKIM, JAMES Treating Mohsen Odenthal/Extender: Altamese CarolinaOBSON, MICHAEL G Weeks in Treatment: 0 Activities of Daily Living Items Answer Activities of Daily Living (Please select one for each item) Drive Automobile Completely Able Take Medications Completely Able Use Telephone Completely Able Care for Appearance Completely Able Use Toilet Completely Able Bath / Shower Completely Able Dress Self Completely Able Feed Self Completely Able Walk Completely Able Get In / Out Bed Completely Able Housework Completely Able Prepare Meals Completely  Able Handle Money Completely Able Shop for Self Completely Able Electronic Signature(s) Signed: 10/12/2017 5:21:12 PM By: Curtis Sitesorthy, Joanna Entered By: Curtis Sitesorthy, Joanna on 10/12/2017 12:49:33 Reino KentWIGGINS, Magdalina ([id number]) -------------------------------------------------------------------------------- Education Assessment Details Patient Name: Reino KentWIGGINS, Smriti Date of Service: 10/12/2017 12:30 PM Medical Record Number: [id number] Patient Account Number: [id number] Date of Birth/Sex: 1955/07/24 (62 y.o. Female) Treating RN: Curtis Sitesorthy, Joanna Primary Care Sharnese Heath: Pearson GrippeKIM, JAMES Other Clinician: Referring Md Smola: Pearson GrippeKIM, JAMES Treating Jahnaya Branscome/Extender: Altamese CarolinaOBSON, MICHAEL G Weeks in Treatment: 0 Primary Learner Assessed: Patient Learning Preferences/Education Level/Primary Language Learning Preference: Explanation, Demonstration Highest Education Level: College or Above Preferred Language: English Cognitive Barrier Assessment/Beliefs Language Barrier: No Translator Needed: No Memory Deficit: No Emotional Barrier: No Cultural/Religious Beliefs Affecting Medical Care: No Physical Barrier Assessment Impaired Vision: No Impaired Hearing: No Decreased Hand dexterity: No Knowledge/Comprehension Assessment Knowledge Level: Medium Comprehension Level: Medium Ability to understand written Medium instructions: Ability to understand verbal Medium instructions: Motivation Assessment Anxiety Level: Calm Cooperation: Cooperative Education Importance: Acknowledges Need Interest in Health Problems: Asks Questions Perception: Coherent Willingness to Engage in Self- Medium Management Activities: Readiness to Engage in Self- Medium Management Activities: Electronic Signature(s) Signed: 10/12/2017 5:21:12 PM By: Curtis Sitesorthy, Joanna Entered By: Curtis Sitesorthy, Joanna on 10/12/2017 12:49:53 Reino KentWIGGINS, Manmeet ([id number]) -------------------------------------------------------------------------------- Fall Risk  Assessment Details Patient Name: Reino KentWIGGINS, Mi[place]oda Date of Service: 10/12/2017 12:30 PM Medical Record Number: [id number] Patient Account Number: [id number] Date of Birth/Sex: 1955/07/24 [age] y.o. Female) Treating RN: Curtis Sitesorthy, Joanna Primary Care Shaine Newmark: Pearson GrippeKIM, JAMES Other Clinician: Referring Adrean Findlay: Pearson GrippeKIM, JAMES Treating Adilynn Bessey/Extender: Altamese CarolinaOBSON, MICHAEL G Weeks in Treatment: 0 Fall Risk Assessment Items Have you had 2 or more falls in the last 12 monthso 0 No Have you had any fall that resulted in injury in the last 12 monthso 0 No FALL RISK ASSESSMENT: History of falling - immediate or within 3 months 0 No Secondary diagnosis 0 No Ambulatory aid None/bed rest/wheelchair/nurse 0 Yes Crutches/cane/walker 0 No Furniture 0 No IV Access/Saline Lock 0 No Gait/Training  Normal/bed rest/immobile 0 Yes Weak 0 No Impaired 0 No Mental Status Oriented to own ability 0 Yes Electronic Signature(s) Signed: 10/12/2017 5:21:12 PM By: Curtis Sites Entered By: Curtis Sites on 10/12/2017 12:50:07 Reino Kent ([id number]) -------------------------------------------------------------------------------- Foot Assessment Details Patient Name: Reino Kent Date of Service: 10/12/2017 12:30 PM Medical Record Number: [id number] Patient Account Number: [id number] Date of Birth/Sex: 1956-01-14 (62 y.o. Female) Treating RN: Curtis Sites Primary Care Trinisha Paget: Pearson Grippe Other Clinician: Referring Marcellus Pulliam: Pearson Grippe Treating Xavian Hardcastle/Extender: Altamese [place] in Treatment: 0 Foot Assessment Items Site Locations + = Sensation present, - = Sensation absent, C = Callus, U = Ulcer R = Redness, W = Warmth, M = Maceration, PU = Pre-ulcerative lesion F = Fissure, S = Swelling, D = Dryness Assessment Right: Left: Other Deformity: No No Prior Foot Ulcer: No No Prior Amputation: No No Charcot Joint: No No Ambulatory Status: Ambulatory Without Help Gait: Steady Electronic  Signature(s) Signed: 10/12/2017 5:21:12 PM By: Curtis Sites Entered By: Curtis Sites on 10/12/2017 12:56:48 Reino Kent ([id number]) -------------------------------------------------------------------------------- Nutrition Risk Assessment Details Patient Name: Reino Kent Date of Service: 10/12/2017 12:30 PM Medical Record Number: [id number] Patient Account Number: [id number] Date of Birth/Sex: [birth year] (62 y.o. Female) Treating RN: Curtis Sites Primary Care Jedd Schulenburg: Pearson Grippe Other Clinician: Referring Jaquia Benedicto: Pearson Grippe Treating Makoa Satz/Extender: Altamese Hardy in Treatment: 0 Height (in): Weight (lbs): Body Mass Index (BMI): Nutrition Risk Assessment Items NUTRITION RISK SCREEN: I have an illness or condition that made me change the kind and/or amount of 0 No food I eat I eat fewer than two meals per day 0 No I eat few fruits and vegetables, or milk products 0 No I have three or more drinks of beer, liquor or wine almost every day 0 No I have tooth or mouth problems that make it hard for me to eat 0 No I don't always have enough money to buy the food I need 0 No I eat alone most of the time 0 No I take three or more different prescribed or over-the-counter drugs a day 1 Yes Without wanting to, I have lost or gained 10 pounds in the last six months 0 No I am not always physically able to shop, cook and/or feed myself 0 No Nutrition Protocols Good Risk Protocol 0 No interventions needed Moderate Risk Protocol Electronic Signature(s) Signed: 10/12/2017 5:21:12 PM By: Curtis Sites Entered By: Curtis Sites on 10/12/2017 12:50:16

## 2017-10-19 ENCOUNTER — Encounter: Payer: 59 | Admitting: Internal Medicine

## 2017-10-19 DIAGNOSIS — I70234 Atherosclerosis of native arteries of right leg with ulceration of heel and midfoot: Secondary | ICD-10-CM | POA: Diagnosis not present

## 2017-10-22 NOTE — Progress Notes (Signed)
Angela, Alvarez ([id number]) Visit Report for 10/19/2017 Arrival Information Details Patient Name: Angela Alvarez, Angela Alvarez Date of Service: 10/19/2017 3:30 PM Medical Record Number: [id number] Patient Account Number: [id number] Date of Birth/Sex: Aug 15, 1955 (62 y.o. F) Treating RN: Angela Alvarez Primary Care Angela Alvarez: Pearson Grippe Other Clinician: Referring Angela Alvarez: Pearson Grippe Treating Angela Alvarez/Extender: Angela Alvarez in Treatment: 1 Visit Information History Since Last Visit All ordered tests and consults were completed: No Patient Arrived: Ambulatory Added or deleted any medications: No Arrival Time: 15:36 Any new allergies or adverse reactions: No Accompanied By: self Had a fall or experienced change in No Transfer Assistance: None activities of daily living that may affect Patient Identification Verified: Yes risk of falls: Secondary Verification Process Yes Signs or symptoms of abuse/neglect since last visito No Completed: Hospitalized since last visit: No Patient Requires Transmission-Based No Implantable device outside of the clinic excluding No Precautions: cellular tissue based products placed in the Alvarez Patient Has Alerts: Yes since last visit: Patient Alerts: Patient on Blood Has Dressing in Place as Prescribed: Yes Thinner Pain Present Now: No aspirin 325 ABI R-0.79 on 07/01/16 ABI L-1.01 on 07/01/2016 Electronic Signature(s) Signed: 10/19/2017 5:07:56 PM By: Angela Alvarez Entered By: Angela Alvarez on 10/19/2017 15:37:46 Angela Alvarez ([id number]) -------------------------------------------------------------------------------- Clinic Level of Care Assessment Details Patient Name: Angela Alvarez Date of Service: 10/19/2017 3:30 PM Medical Record Number: [id number] Patient Account Number: [id number] Date of Birth/Sex: July 23, 1955 (62 y.o. F) Treating RN: Angela Crigler Primary Care Angela Alvarez: Pearson Grippe Other Clinician: Referring  Angela Alvarez: Pearson Grippe Treating Angela Alvarez/Extender: Angela Alvarez in Treatment: 1 Clinic Level of Care Assessment Items TOOL 4 Quantity Score X - Use when only an EandM is performed on FOLLOW-UP visit 1 0 ASSESSMENTS - Nursing Assessment / Reassessment X - Reassessment of Co-morbidities (includes updates in patient status) 1 10 X- 1 5 Reassessment of Adherence to Treatment Plan ASSESSMENTS - Wound and Skin Assessment / Reassessment X - Simple Wound Assessment / Reassessment - one wound 1 5 []  - 0 Complex Wound Assessment / Reassessment - multiple wounds []  - 0 Dermatologic / Skin Assessment (not related to wound area) ASSESSMENTS - Focused Assessment []  - Circumferential Edema Measurements - multi extremities 0 []  - 0 Nutritional Assessment / Counseling / Intervention []  - 0 Lower Extremity Assessment (monofilament, tuning fork, pulses) []  - 0 Peripheral Arterial Disease Assessment (using hand held doppler) ASSESSMENTS - Ostomy and/or Continence Assessment and Care []  - Incontinence Assessment and Management 0 []  - 0 Ostomy Care Assessment and Management (repouching, etc.) PROCESS - Coordination of Care X - Simple Patient / Family Education for ongoing care 1 15 []  - 0 Complex (extensive) Patient / Family Education for ongoing care []  - 0 Staff obtains Chiropractor, Records, Test Results / Process Orders []  - 0 Staff telephones HHA, Nursing Homes / Clarify orders / etc []  - 0 Routine Transfer to another Facility (non-emergent condition) []  - 0 Routine Hospital Admission (non-emergent condition) []  - 0 New Admissions / Manufacturing engineer / Ordering NPWT, Apligraf, etc. []  - 0 Emergency Hospital Admission (emergent condition) X- 1 10 Simple Discharge Coordination Angela Alvarez, Angela Alvarez ([id number]) []  - 0 Complex (extensive) Discharge Coordination PROCESS - Special Needs []  - Pediatric / Minor Patient Management 0 []  - 0 Isolation Patient Management []  -  0 Hearing / Language / Visual special needs []  - 0 Assessment of Community assistance (transportation, D/C planning, etc.) []  - 0 Additional assistance / Altered mentation []  - 0 Support Surface(s) Assessment (bed, cushion, seat, etc.) INTERVENTIONS -  Wound Cleansing / Measurement []  - Simple Wound Cleansing - one wound 0 []  - 0 Complex Wound Cleansing - multiple wounds X- 1 5 Wound Imaging (photographs - any number of wounds) []  - 0 Wound Tracing (instead of photographs) []  - 0 Simple Wound Measurement - one wound []  - 0 Complex Wound Measurement - multiple wounds INTERVENTIONS - Wound Dressings []  - Small Wound Dressing one or multiple wounds 0 []  - 0 Medium Wound Dressing one or multiple wounds []  - 0 Large Wound Dressing one or multiple wounds []  - 0 Application of Medications - topical []  - 0 Application of Medications - injection INTERVENTIONS - Miscellaneous []  - External ear exam 0 []  - 0 Specimen Collection (cultures, biopsies, blood, body fluids, etc.) []  - 0 Specimen(s) / Culture(s) sent or taken to Lab for analysis []  - 0 Patient Transfer (multiple staff / Nurse, adultHoyer Lift / Similar devices) []  - 0 Simple Staple / Suture removal (25 or less) []  - 0 Complex Staple / Suture removal (26 or more) []  - 0 Hypo / Hyperglycemic Management (close monitor of Blood Glucose) []  - 0 Ankle / Brachial Index (ABI) - do not check if billed separately X- 1 5 Vital Signs Angela Alvarez ([id number]) Has the patient been seen at the hospital within the last three years: Yes Total Score: 55 Level Of Care: New/Established - Level 2 Electronic Signature(s) Signed: 10/19/2017 4:58:57 PM By: Angela Alvarez, Angela Entered By: Angela Alvarez, Angela on 10/19/2017 15:57:56 Angela Alvarez ([id number]) -------------------------------------------------------------------------------- Encounter Discharge Information Details Patient Name: Angela Alvarez Date of Service: 10/19/2017 3:30  PM Medical Record Number: [id number] Patient Account Number: [id number] Date of Birth/Sex: 1955-06-19 (62 y.o. F) Treating RN: Angela Alvarez, Angela Primary Care Kolt Mcwhirter: Pearson GrippeKIM, JAMES Other Clinician: Referring Chyrel Taha: Pearson GrippeKIM, JAMES Treating Bena Kobel/Extender: Angela CarolinaOBSON, MICHAEL G Weeks in Treatment: 1 Encounter Discharge Information Items Discharge Condition: Stable Ambulatory Status: Ambulatory Discharge Destination: Home Transportation: Private Auto Schedule Follow-up Appointment: No Clinical Summary of Care: Electronic Signature(s) Signed: 10/19/2017 4:58:57 PM By: Angela Alvarez, Angela Entered By: Angela Alvarez, Angela on 10/19/2017 16:01:58 Angela Alvarez ([id number]) -------------------------------------------------------------------------------- Lower Extremity Assessment Details Patient Name: Angela Alvarez Date of Service: 10/19/2017 3:30 PM Medical Record Number: [id number] Patient Account Number: [id number] Date of Birth/Sex: 1955-06-19 (62 y.o. F) Treating RN: Angela HaggisPinkerton, Debi Primary Care Amazing Cowman: Pearson GrippeKIM, JAMES Other Clinician: Referring Misha Vanoverbeke: Pearson GrippeKIM, JAMES Treating Breea Loncar/Extender: Angela CarolinaOBSON, MICHAEL G Weeks in Treatment: 1 Vascular Assessment Pulses: Dorsalis Pedis Palpable: [Right:Yes] Posterior Tibial Extremity colors, hair growth, and conditions: Extremity Color: [Right:Normal] Temperature of Extremity: [Right:Warm] Capillary Refill: [Right:< 3 seconds] Toe Nail Assessment Left: Right: Thick: Yes Discolored: No Deformed: No Improper Length and Hygiene: No Electronic Signature(s) Signed: 10/19/2017 5:07:56 PM By: Angela MullingPinkerton, Debra Entered By: Angela MullingPinkerton, Debra on 10/19/2017 15:43:52 Angela Alvarez ([id number]) -------------------------------------------------------------------------------- Multi Wound Chart Details Patient Name: Angela Alvarez Date of Service: 10/19/2017 3:30 PM Medical Record Number: [id number] Patient Account Number: [id number] Date of Birth/Sex:  1955-06-19 (62 y.o. F) Treating RN: Angela Alvarez, Angela Primary Care Erin Obando: Pearson GrippeKIM, JAMES Other Clinician: Referring Lagena Strand: Pearson GrippeKIM, JAMES Treating Sarahlynn Cisnero/Extender: Angela CarolinaOBSON, MICHAEL G Weeks in Treatment: 1 Vital Signs Height(in): 66 Pulse(bpm): 85 Weight(lbs): 187 Blood Pressure(mmHg): 160/73 Body Mass Index(BMI): 30 Temperature(F): 98.2 Respiratory Rate 18 (breaths/min): Photos: [1:No Photos] [N/A:N/A] Wound Location: [1:Right Calcaneus] [N/A:N/A] Wounding Event: [1:Gradually Appeared] [N/A:N/A] Primary Etiology: [1:Pressure Ulcer] [N/A:N/A] Date Acquired: [1:08/29/2017] [N/A:N/A] Weeks of Treatment: [1:1] [N/A:N/A] Wound Status: [1:Healed - Epithelialized] [N/A:N/A] Measurements L x W x D [1:0x0x0] [N/A:N/A] (cm) Area (cm) : [1:0] [N/A:N/A] Volume (cm) : [1:0] [N/A:N/A] % Reduction  in Area: [1:100.00%] [N/A:N/A] % Reduction in Volume: [1:100.00%] [N/A:N/A] Classification: [1:Full Thickness Without Exposed Support Structures] [N/A:N/A] Periwound Skin Texture: [1:No Abnormalities Noted] [N/A:N/A] Periwound Skin Moisture: [1:No Abnormalities Noted] [N/A:N/A] Periwound Skin Color: [1:No Abnormalities Noted No] [N/A:N/A N/A] Treatment Notes Electronic Signature(s) Signed: 10/21/2017 7:56:50 AM By: Baltazar Najjar MD Entered By: Baltazar Najjar on 10/19/2017 16:08:26 Angela Alvarez ([id number]) -------------------------------------------------------------------------------- Multi-Disciplinary Care Plan Details Patient Name: Angela Alvarez Date of Service: 10/19/2017 3:30 PM Medical Record Number: [id number] Patient Account Number: [id number] Date of Birth/Sex: [birth year] (62 y.o. F) Treating RN: Angela Crigler Primary Care Madison Direnzo: Pearson Grippe Other Clinician: Referring Dawnetta Copenhaver: Pearson Grippe Treating Jatniel Verastegui/Extender: Angela [place] in Treatment: 1 Active Inactive Electronic Signature(s) Signed: 10/19/2017 4:58:57 PM By: Angela Crigler Entered By:  Angela Crigler on 10/19/2017 16:08:23 Angela Alvarez ([id number]) -------------------------------------------------------------------------------- Pain Assessment Details Patient Name: Angela Alvarez Date of Service: 10/19/2017 3:30 PM Medical Record Number: [id number] Patient Account Number: [id number] Date of Birth/Sex: 12-15-1955 (62 y.o. F) Treating RN: Angela Alvarez Primary Care Kanae Ignatowski: Pearson Grippe Other Clinician: Referring Zafiro Routson: Pearson Grippe Treating Ciarra Braddy/Extender: Angela [place] in Treatment: 1 Active Problems Location of Pain Severity and Description of Pain Patient Has Paino No Site Locations Pain Management and Medication Current Pain Management: Electronic Signature(s) Signed: 10/19/2017 5:07:56 PM By: Angela Alvarez Entered By: Angela Alvarez on 10/19/2017 15:37:53 Angela Alvarez ([id number]) -------------------------------------------------------------------------------- Patient/Caregiver Education Details Patient Name: Angela Alvarez Date of Service: 10/19/2017 3:30 PM Medical Record Number: [id number] Patient Account Number: [id number] Date of Birth/Gender: September 20, 1955 (62 y.o. F) Treating RN: Angela Crigler Primary Care Physician: Pearson Grippe Other Clinician: Referring Physician: Pearson Grippe Treating Physician/Extender: Angela [place] in Treatment: 1 Education Assessment Education Provided To: Patient Education Topics Provided Wound/Skin Impairment: Handouts: Skin Care Do's and Dont's Methods: Explain/Verbal Responses: State content correctly Electronic Signature(s) Signed: 10/19/2017 4:58:57 PM By: Angela Crigler Entered By: Angela Crigler on 10/19/2017 16:02:08 Angela Alvarez ([id number]) -------------------------------------------------------------------------------- Wound Assessment Details Patient Name: Angela Alvarez Date of Service: 10/19/2017 3:30 PM Medical Record Number: [id number] Patient  Account Number: [id number] Date of Birth/Sex: 16-Oct-1955 (62 y.o. F) Treating RN: Angela Crigler Primary Care Shadell Brenn: Pearson Grippe Other Clinician: Referring Markayla Reichart: Pearson Grippe Treating Theodoros Stjames/Extender: Angela [place] in Treatment: 1 Wound Status Wound Number: 1 Primary Etiology: Pressure Ulcer Wound Location: Right Calcaneus Wound Status: Healed - Epithelialized Wounding Event: Gradually Appeared Date Acquired: 08/29/2017 Weeks Of Treatment: 1 Clustered Wound: No Photos Photo Uploaded By: Angela Alvarez on 10/19/2017 16:53:28 Wound Measurements Length: (cm) 0 Width: (cm) 0 Depth: (cm) 0 Area: (cm) 0 Volume: (cm) 0 % Reduction in Area: 100% % Reduction in Volume: 100% Wound Description Full Thickness Without Exposed Support Classification: Structures Periwound Skin Texture Texture Color No Abnormalities Noted: No No Abnormalities Noted: No Moisture No Abnormalities Noted: No Electronic Signature(s) Signed: 10/19/2017 4:58:57 PM By: Angela Crigler Entered By: Angela Crigler on 10/19/2017 15:57:05 Angela Alvarez ([id number]) -------------------------------------------------------------------------------- Vitals Details Patient Name: Angela Alvarez Date of Service: 10/19/2017 3:30 PM Medical Record Number: [id number] Patient Account Number: [id number] Date of Birth/Sex: Jun 07, 1955 (62 y.o. F) Treating RN: Angela Alvarez Primary Care Meygan Kyser: Pearson Grippe Other Clinician: Referring Dabria Wadas: Pearson Grippe Treating Terryann Verbeek/Extender: Angela [place] in Treatment: 1 Vital Signs Time Taken: 15:37 Temperature (F): 98.2 Height (in): 66 Pulse (bpm): 85 Weight (lbs): 187 Respiratory Rate (breaths/min): 18 Body Mass Index (BMI): 30.2 Blood Pressure (mmHg): 160/73 Reference Range: 80 - 120 mg / dl Electronic Signature(s) Signed: 10/19/2017 5:07:56 PM By: Angela Alvarez Entered By: Angela Alvarez  on 10/19/2017 15:39:29

## 2017-10-22 NOTE — Progress Notes (Signed)
Angela Alvarez, Katherin ([id number]) Visit Report for 10/19/2017 HPI Details Patient Name: Angela Alvarez, [name] Date of Service: 10/19/2017 3:30 PM Medical Record Number: [id number] Patient Account Number: [id number] Date of Birth/Sex: [birth year] (62 y.o. F) Treating RN: Curtis Sitesorthy, Joanna Primary Care Provider: Pearson GrippeKIM, JAMES Other Clinician: Referring Provider: Pearson GrippeKIM, JAMES Treating Provider/Extender: Altamese CarolinaOBSON, MICHAEL G Weeks in Treatment: 1 History of Present Illness HPI Description: ADMISSION 10/12/17 This is a 62 year old woman who is here for review of wound on the right lateral calcaneus close to the weightbearing surface of the heel. She is not a diabetic. She has a complicated vascular history initially presenting to Dr. Gery PrayBarry in 2017 with a left great toe discoloration. 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 She also had a 75-80% fairly focal mid left SFA stenosis with 2 vessel runoff and occluded anterior tibial. On the right a 90% fairly focal mid SFA stenosis with an occluded anterior tibial artery widely patent posterior tibial as well as dorsalis pedis arch. She was not felt to have a viable endovascular or surgical options. She was started on aspirin and Plavix as well as statins. The patient has not really been taking aspirin and stop the Plavix recently because of skin bruising. Last arterial studies on 07/01/16 showed triphasic waveforms at the right posterior tibial and biphasic at the dorsalis pedis ABI of 0.95 on the right. Currently she states that in the 5-6 months she developed a callus in the area of the wound and eventually this came off but she was left with an open sore. She has been putting topical antibiotics on this herself and indeed there appears to have already been a lot of healing. She has been vigorously offloading this using sandals rather than anything that might  rub on the wound. 10/19/17 Patient came in to the clinic last week. She has a history of cholesterol emboli as noted above at our suggestion she is back on aspirin. She has not been taking the Plavix. She followed up with Dr. Darrick Pennafields last week had arterial studies that were quite normal including ABIs and TBIs read some reduction in waveforms. I'm glad to note he is following her closely. The wound on the outside of of the right lateral calcaneus just above the plantar surface. This is fully epithelialized today. Somewhat surprising development however generally welcome Electronic Signature(s) Signed: 10/21/2017 7:56:50 AM By: Baltazar Najjarobson, Michael MD Entered By: Baltazar Najjarobson, Michael on 10/19/2017 16:10:11 Angela Alvarez, Adiel ([id number]) -------------------------------------------------------------------------------- Physical Exam Details Patient Name: Angela Alvarez, Angela Alvarez Date of Service: 10/19/2017 3:30 PM Medical Record Number: [id number] Patient Account Number: [id number] Date of Birth/Sex: [birth year] (62 y.o. F) Treating RN: Curtis Sitesorthy, Joanna Primary Care Provider: Pearson GrippeKIM, JAMES Other Clinician: Referring Provider: Pearson GrippeKIM, JAMES Treating Provider/Extender: Altamese CarolinaOBSON, MICHAEL G Weeks in Treatment: 1 Constitutional Patient is hypertensive.. Pulse regular and within target range for patient.Marland Kitchen. Respirations regular, non-labored and within target range.. Temperature is normal and within the target range for the patient.Marland Kitchen. appears in no distress. Notes wound exam; the patient's area on the right lateral heel is totally epithelialized. There is no surrounding erythema no open wound and no tenderness Electronic Signature(s) Signed: 10/21/2017 7:56:50 AM By: Baltazar Najjarobson, Michael MD Entered By: Baltazar Najjarobson, Michael on 10/19/2017 16:11:28 Angela Alvarez, Angela Alvarez ([id number]) -------------------------------------------------------------------------------- Physician Orders Details Patient Name: Angela Alvarez, Angela Alvarez Date of Service:  10/19/2017 3:30 PM Medical Record Number: [id number] Patient Account Number: [id number] Date of Birth/Sex: [birth year] (62 y.o. F) Treating RN: Renne CriglerFlinchum, Cheryl  Primary Care Provider: Pearson Grippe Other Clinician: Referring Provider: Pearson Grippe Treating Provider/Extender: Altamese [place] in Treatment: 1 Verbal / Phone Orders: No Diagnosis Coding Discharge From [facility] Services o Discharge from Wound Care Center - treatment completed Electronic Signature(s) Signed: 10/19/2017 4:58:57 PM By: Renne Crigler Signed: 10/21/2017 7:56:50 AM By: Baltazar Najjar MD Entered By: Renne Crigler on 10/19/2017 15:57:33 Angela Alvarez ([id number]) -------------------------------------------------------------------------------- Problem List Details Patient Name: Angela Alvarez Date of Service: 10/19/2017 3:30 PM Medical Record Number: [id number] Patient Account Number: [id number] Date of Birth/Sex: 02-02-1956 (62 y.o. F) Treating RN: Curtis Sites Primary Care Provider: Pearson Grippe Other Clinician: Referring Provider: Pearson Grippe Treating Provider/Extender: Altamese [place] in Treatment: 1 Active Problems ICD-10 Evaluated Encounter Code Description Active Date Today Diagnosis L89.613 Pressure ulcer of right heel, stage 3 10/12/2017 Yes Yes Status Complications Interventions Improving the wound is totally epithelialized. I reviewed the history with healed with Iodoflex her disc simply opened as a cut in her heel 2 months ago no I've advised keeping known trauma this protected with a Medical thick Band-Aid or Decision foam. Continue to Making : wear sandals for a month or other heel offloading shoe I70.234 Atherosclerosis of native arteries of right leg with ulceration 10/12/2017 Yes Yes of heel and midfoot Status Complications Interventions Other new patient; this is a patient with a complex history of at our advice she is cholesterol emboli presenting in 2017. She had a  right first back on the aspirin. toe amputation in February 2017. Angiogram on 04/28/15 She follows with Dr. Medical showed atheromatous area within the origin of the right fields of vascular Decision common iliac artery. She also had a 75-80% mid left SFA surgery Making : stenosis two-vessel runoff and an occluded anterior tibial finally a right mid SFA stenosis with an occluded anterior tibial. She was not felt to have a viable endovascular surgical option. She followed with Dr. Darrick Penna Inactive Problems Resolved Problems Electronic Signature(s) Signed: 10/21/2017 7:56:50 AM By: Baltazar Najjar MD Entered By: Baltazar Najjar on 10/19/2017 16:08:14 Angela Alvarez ([id number]) -------------------------------------------------------------------------------- Progress Note Details Patient Name: Angela Alvarez Date of Service: 10/19/2017 3:30 PM Medical Record Number: [id number] Patient Account Number: [id number] Date of Birth/Sex: 1955/10/04 (62 y.o. F) Treating RN: Curtis Sites Primary Care Provider: Pearson Grippe Other Clinician: Referring Provider: Pearson Grippe Treating Provider/Extender: Altamese [place] in Treatment: 1 Subjective History of Present Illness (HPI) ADMISSION 10/12/17 This is a 62 year old woman who is here for review of wound on the right lateral calcaneus close to the weightbearing surface of the heel. She is not a diabetic. She has a complicated vascular history initially presenting to Dr. Gery Pray in 2017 with a left great toe discoloration. 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 She also had a 75-80% fairly focal mid left SFA stenosis with 2 vessel runoff and occluded anterior tibial. On the right a 90% fairly focal mid SFA stenosis with an occluded anterior tibial artery widely patent posterior tibial as well as dorsalis pedis arch. She was not felt  to have a viable endovascular or surgical options. She was started on aspirin and Plavix as well as statins. The patient has not really been taking aspirin and stop the Plavix recently because of skin bruising. Last arterial studies on 07/01/16 showed triphasic waveforms at the right posterior tibial and biphasic at the dorsalis pedis ABI of 0.95 on the right. Currently  she states that in the 5-6 months she developed a callus in the area of the wound and eventually this came off but she was left with an open sore. She has been putting topical antibiotics on this herself and indeed there appears to have already been a lot of healing. She has been vigorously offloading this using sandals rather than anything that might rub on the wound. 10/19/17 Patient came in to the clinic last week. She has a history of cholesterol emboli as noted above at our suggestion she is back on aspirin. She has not been taking the Plavix. She followed up with Dr. Darrick Penna last week had arterial studies that were quite normal including ABIs and TBIs read some reduction in waveforms. I'm glad to note he is following her closely. The wound on the outside of of the right lateral calcaneus just above the plantar surface. This is fully epithelialized today. Somewhat surprising development however generally welcome Objective Constitutional Patient is hypertensive.. Pulse regular and within target range for patient.Marland Kitchen Respirations regular, non-labored and within target range.. Temperature is normal and within the target range for the patient.Marland Kitchen appears in no distress. Vitals Time Taken: 3:37 PM, Height: 66 in, Weight: 187 lbs, BMI: 30.2, Temperature: 98.2 F, Pulse: 85 bpm, Respiratory Rate: 18 breaths/min, Blood Pressure: 160/73 mmHg. ALVIE, SPELTZ ([id number]) General Notes: wound exam; the patient's area on the right lateral heel is totally epithelialized. There is no surrounding erythema no open wound and no  tenderness Integumentary (Hair, Skin) Wound #1 status is Healed - Epithelialized. Original cause of wound was Gradually Appeared. The wound is located on the Right Calcaneus. The wound measures 0cm length x 0cm width x 0cm depth; 0cm^2 area and 0cm^3 volume. 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 I reviewed the history with her disc simply opened as a cut in her heel 2 months ago no known trauma Interventions: healed with Iodoflex I've advised keeping this protected with a thick Band-Aid or foam. 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 Angiogram on 04/28/15 showed atheromatous area within the origin of the right common iliac artery. She also had a 75-80% mid left SFA stenosis two-vessel runoff and an occluded anterior tibial finally a right mid SFA stenosis with an occluded anterior tibial. She was not felt to have a viable endovascular surgical option. She followed with Dr. Darrick Penna Interventions: at our advice she is back on the aspirin. She follows with Dr. Darrick Penna of vascular surgery #1 I think the patient to be discharged #2 I'm not exactly sure how this wound began or the exact etiology. She has a history of cholesterol emboli but I'm doubtful that's what this was although it certainly possible she wasn't taking any of her medications. At our  suggestion she is back on aspirin. #3 I recommended keeping this covered with a thick Band-Aid or foam and to keep this offloaded by wearing open heeled shoes like she's been doing for  at least the next month Electronic Signature(s) SHAWN, DANNENBERG ([id number]) Signed: 10/21/2017 7:56:50 AM By: Baltazar Najjar MD Entered By: Baltazar Najjar on 10/19/2017 16:12:22 Angela Alvarez ([id number]) -------------------------------------------------------------------------------- SuperBill Details Patient Name: Angela Alvarez Date of Service: 10/19/2017 Medical Record Number: [id number] Patient Account Number: [id number] Date of Birth/Sex: [birth year] (62 y.o. 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

## 2017-10-26 ENCOUNTER — Other Ambulatory Visit: Payer: Self-pay

## 2017-10-26 DIAGNOSIS — I779 Disorder of arteries and arterioles, unspecified: Secondary | ICD-10-CM

## 2017-10-26 DIAGNOSIS — I70269 Atherosclerosis of native arteries of extremities with gangrene, unspecified extremity: Secondary | ICD-10-CM

## 2017-10-26 DIAGNOSIS — I70229 Atherosclerosis of native arteries of extremities with rest pain, unspecified extremity: Secondary | ICD-10-CM

## 2017-10-26 DIAGNOSIS — I998 Other disorder of circulatory system: Secondary | ICD-10-CM

## 2017-10-26 DIAGNOSIS — I739 Peripheral vascular disease, unspecified: Secondary | ICD-10-CM

## 2017-11-18 ENCOUNTER — Ambulatory Visit (HOSPITAL_COMMUNITY)
Admission: RE | Admit: 2017-11-18 | Discharge: 2017-11-18 | Disposition: A | Discharge status: Home / Self Care | Payer: 59 | Source: Ambulatory Visit | Attending: Vascular Surgery | Admitting: Vascular Surgery

## 2017-11-18 ENCOUNTER — Encounter: Payer: Self-pay | Admitting: Family

## 2017-11-18 ENCOUNTER — Ambulatory Visit: Payer: 59 | Admitting: Family

## 2017-11-18 VITALS — BP 134/85 | HR 93 | Temp 97.2°F | Resp 16 | Ht 64.0 in | Wt 186.0 lb

## 2017-11-18 DIAGNOSIS — I70269 Atherosclerosis of native arteries of extremities with gangrene, unspecified extremity: Secondary | ICD-10-CM | POA: Insufficient documentation

## 2017-11-18 DIAGNOSIS — I779 Disorder of arteries and arterioles, unspecified: Secondary | ICD-10-CM

## 2017-11-18 DIAGNOSIS — I998 Other disorder of circulatory system: Secondary | ICD-10-CM | POA: Diagnosis not present

## 2017-11-18 DIAGNOSIS — I75021 Atheroembolism of right lower extremity: Secondary | ICD-10-CM

## 2017-11-18 DIAGNOSIS — Z87891 Personal history of nicotine dependence: Secondary | ICD-10-CM | POA: Insufficient documentation

## 2017-11-18 DIAGNOSIS — I739 Peripheral vascular disease, unspecified: Secondary | ICD-10-CM

## 2017-11-18 DIAGNOSIS — I70229 Atherosclerosis of native arteries of extremities with rest pain, unspecified extremity: Secondary | ICD-10-CM

## 2017-11-18 NOTE — Patient Instructions (Signed)

## 2017-11-18 NOTE — Progress Notes (Signed)
[facility] [place]   CC: Follow up peripheral artery occlusive disease  History of Present Illness Angela Alvarez is a 62 y.o. female who returns for follow-up today. 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 This was documented on CT scan and by angiogram. 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 She has had no problems since then other than some occasional pins and needle sensation in the right first toe bed. She denies any claudication symptoms. She has now been tobacco free since 2017 and quit with the assistance of Chantix. She is on aspirin and Lipitor.  Dr. Darrick Penna last evaluated pt on 07-01-16. At that time patient was doing well, most likely had an atheroembolic event from ruptured plaque in the aorta. This is been asymptomatic and stable for over a year. We have previously discussed whether or not to remove this plaque but since she has now been stable for some time and do not believe he needs any intervention at this point. Plan: The patient was to continue her antiplatelet and statin agent. She was to follow-up with [place] in one year for repeat ABIs.  She states her feet hurt when she walks, and this limits her walking, along with torn meniscus that occurred in December 2018, slid when bowling, is seeing ortho, Dr. Valma Cava. 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   She is being seen at the wound care center at [facility], was there yesterday for healing wound right heel.   She denies any known hx of stroke or TIA. She denies any known heart problems.      Diabetic: No Tobacco use: former smoker, quit in 2017  Pt meds include: Statin :Yes Betablocker: No ASA: Yes, only started taking in February 2019 Other anticoagulants/antiplatelets: no, she stopped Plavix taking after a month's use  after right great toe amputation. She stopped due to excessive bruising. Pt states she told her PCP she stopped the Plavix.     Past Medical History:  Diagnosis Date  . Abnormal Pap smear of cervix 06-01-13   ascus pap:pos.HR HPV/hx of cryotherapy to cx 1985  . Asthma   . COPD (chronic obstructive pulmonary disease) (HCC)   . Critical lower limb ischemia    ischemic appearing left first toe  . Peripheral vascular disease [facility])     Social History Social History   Tobacco Use  . Smoking status: Former Smoker    Packs/day: 1.00    Types: Cigarettes    Last attempt to quit: 06/07/2015    Years since quitting: 2.4  . Smokeless tobacco: Never Used  Substance Use Topics  . Alcohol use: No    Alcohol/week: 0.0 standard drinks  . Drug use: No    Family History Family History  Problem Relation Age of Onset  . Stroke Mother        dec age 19  . Hypertension Mother   . Cancer Father        lung age 35  . Cancer Sister        dec colon ca age 106  . Hypertension Sister   . Diabetes Neg Hx   . Heart attack Neg Hx   . Hyperlipidemia Neg Hx   . Sudden death Neg Hx     Past Surgical History:  Procedure Laterality Date  . AMPUTATION Right 05/19/2015  Procedure: RIGHT FIRST RAY AMPUTATION;  Surgeon: Sherren Kerns, MD;  Location: [facility] OR;  Service: Vascular;  Laterality: Right;  . BREAST SURGERY Right   . BUNIONECTOMY     bilateral  . CARDIAC CATHETERIZATION Bilateral    Catarct  . COLONOSCOPY W/ POLYPECTOMY    . KNEE ARTHROPLASTY Right 07/21/2017  . PERIPHERAL VASCULAR CATHETERIZATION N/A 04/28/2015   Procedure: Lower Extremity Angiography;  Surgeon: Runell Gess, MD;  Location: [facility] INVASIVE CV LAB;  Service: Cardiovascular;  Laterality: N/A;  . surgery under arm Right    --benign breast tissue    Allergies  Allergen Reactions  . Amoxicillin-Pot Clavulanate Nausea And Vomiting    NAUSEA AND VOMITING NAUSEA AND VOMITING   . Levofloxacin Nausea And Vomiting     Current Outpatient Medications  Medication Sig Dispense Refill  . albuterol (PROVENTIL,VENTOLIN) 90 MCG/ACT inhaler Inhale 2 puffs into the lungs every 4 (four) hours as needed for wheezing or shortness of breath.     Marland Kitchen aspirin 325 MG tablet Take 325 mg by mouth daily. Reported on 06/12/2015    . atorvastatin (LIPITOR) 40 MG tablet     . budesonide-formoterol (SYMBICORT) 160-4.5 MCG/ACT inhaler Inhale 2 puffs into the lungs 2 (two) times daily.      . clobetasol cream (TEMOVATE) 0.05 % APPLY TOPICALLY TWICE WEEKLY 30 g 0  . clopidogrel (PLAVIX) 75 MG tablet Take 1 tablet (75 mg total) by mouth daily with breakfast. 30 tablet 11  . ondansetron (ZOFRAN) 4 MG tablet Take 1 tablet (4 mg total) by mouth every 6 (six) hours. 60 tablet 1  . varenicline (CHANTIX) 1 MG tablet Take 1 mg by mouth 2 (two) times daily.     No current facility-administered medications for this visit.     ROS: See HPI for pertinent positives and negatives.   Physical Examination  Vitals:   11/18/17 0839  BP: 134/85  Pulse: 93  Resp: 16  Temp: (!) 97.2 F (36.2 C)  TempSrc: Oral  SpO2: 97%  Weight: 186 lb (84.4 kg)  Height: 5\' 4"  (1.626 m)   Body mass index is 31.93 kg/m.  General: A&O x 3, WDWN, obese female. Gait: normal HENT: No gross abnormalities.  Eyes: PERRLA. Pulmonary: Respirations are non labored, CTAB, good air movement in all fields Cardiac: regular rhythm, no detected murmur.         Carotid Bruits Right Left   Negative Negative   Radial pulses are 2+ palpable bilaterally   Adominal aortic pulse is not palpable                            Right heel     VASCULAR EXAM: Extremities without ischemic changes, without Gangrene; with callus formed over previous open ulcer at right lateral/posterior heel wound (see above photo). Right 1st toe is surgically absent.  LE Pulses Right Left       FEMORAL  1+ palpable  2+ palpable        POPLITEAL  not palpable   not palpable       POSTERIOR TIBIAL  2+ palpable   not palpable        DORSALIS PEDIS      ANTERIOR TIBIAL not palpable  2+ palpable    Abdomen: soft, NT, no palpable masses. Skin: no rashes, no cellulitis, see Extremities Musculoskeletal: no muscle wasting or atrophy.      Neurologic: A&O X 3; appropriate affect, Sensation is normal; MOTOR FUNCTION:  moving all extremities equally, motor strength 5/5 throughout. Speech is fluent/normal. CN 2-12 intact. Psychiatric: Thought content is normal, mood appropriate for clinical situation. Loquacious.      ASSESSMENT: Angela Alvarez is a 62 y.o. 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 This was documented on CT scan and by angiogram. 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 She has had no problems since then other than some occasional pins and needle sensation in the right first toe bed. She denies any claudication symptoms.  There are no signs of ischemia in her feet or legs.   At her 10-13-17 visit I discussed with Dr. Darrick PennaFields that pt has a healing wound on her right heel, has a 2+ palpable right PT pulse, had a 90% stenosis in right SFA on 2017 angiogram by Dr. Allyson SabalBerry, and some abdominal aortic stenosis, and that pt was attending a wound care center for her healing right heel ulcer .  Since she had excessive bruising with the use of Plavix, does not need to continue taking Plavix (discussed with Dr. Darrick PennaFields).   Continue taking 81 mg ASA daily.   Pt desires sclerotherapy for her multiple spider leg veins; Lilly gave Clementeen HoofLiz Wert RN contact information to discuss.   Her last visit to the wound care center was 10-19-17, Dr. Jannetta Quintobson's note as follows:  "#1 I think the patient  to be discharged #2 I'm not exactly sure how this wound began or the exact etiology. She has a history of cholesterol emboli but I'm doubtful that's what this was although it certainly possible she wasn't taking any of her medications. At our suggestion she is back on aspirin. #3 I recommended keeping this covered with a thick Band-Aid or foam and to keep this offloaded by wearing open heeled shoes like she's been doing for at least the next month"  Her right heel ulcer has improved to a callus, see photo below.  Right ABI today improved from 78% to 93% in a month, and pt admits to not performing daily seated leg exercises. Left ABI remains normal, but left TBI declined.   Her knee pain limits her walking, right heel callus is also painful when pressure applied.   DATA  ABI (Date: 11/18/2017):  R:   ABI: 0.93 (was 0.78 on 10-13-17),   PT: tri (was bi)  DP: tri (was bi)  TBI:  Right great toe is surgically absent  L:   ABI: 1.07 (was 0.97),   PT: tri (was mono)  DP: tri (was bi)  TBI: 0.57, toe pressure 95 (was 0.70) Improved and normal bilateral ABI with all triphasic waveforms. Waveform quality improved to all triphasic.  Decline in left TBI, but toe pressure remains more than adequate for healing purposes.  PLAN:  Based on the patient's vascular studies and examination, pt will return to clinic in 3 months with ABI's and right heel wound check.  I advised her to notify us if she develops concerns re the circulation in her feet or legs.  Daily seated leg exercises discussed and demonstrated.   I discussed in depth with the patient the nature of atherosclerosis, and emphasized the importance of maximal medical management including strict control of blood pressure, blood glucose, and lipid levels, obtaining regular exercise, and continued cessation of smoking.  The patient is aware that without maximal medical management the underlying atherosclerotic disease process  will progress, limiting the benefit of any interventions.  The patient was given information about PAD including signs, symptoms, treatment, what symptoms should prompt the patient to seek immediate medical care, and risk reduction measures to take.  Charisse MarchSuzanne Ezrah Dembeck, RN, MSN, FNP-C [facility] [place] Office Phone: [phone]  Clinic MD: Randie HeinzCain  11/18/17 8:53 AM

## 2017-11-21 ENCOUNTER — Other Ambulatory Visit: Payer: Self-pay

## 2017-11-21 DIAGNOSIS — I70269 Atherosclerosis of native arteries of extremities with gangrene, unspecified extremity: Secondary | ICD-10-CM

## 2017-11-21 DIAGNOSIS — I739 Peripheral vascular disease, unspecified: Secondary | ICD-10-CM

## 2017-11-21 DIAGNOSIS — I779 Disorder of arteries and arterioles, unspecified: Secondary | ICD-10-CM

## 2018-02-17 ENCOUNTER — Ambulatory Visit (HOSPITAL_COMMUNITY)
Admission: RE | Admit: 2018-02-17 | Discharge: 2018-02-17 | Disposition: A | Discharge status: Home / Self Care | Payer: 59 | Source: Ambulatory Visit | Attending: Internal Medicine | Admitting: Internal Medicine

## 2018-02-17 ENCOUNTER — Encounter: Payer: Self-pay | Admitting: Family

## 2018-02-17 ENCOUNTER — Ambulatory Visit: Payer: 59 | Admitting: Family

## 2018-02-17 ENCOUNTER — Other Ambulatory Visit: Payer: Self-pay

## 2018-02-17 VITALS — BP 167/87 | HR 87 | Resp 18 | Ht 64.0 in | Wt 187.0 lb

## 2018-02-17 DIAGNOSIS — I779 Disorder of arteries and arterioles, unspecified: Secondary | ICD-10-CM | POA: Diagnosis present

## 2018-02-17 DIAGNOSIS — Z89411 Acquired absence of right great toe: Secondary | ICD-10-CM

## 2018-02-17 DIAGNOSIS — Z87891 Personal history of nicotine dependence: Secondary | ICD-10-CM

## 2018-02-17 DIAGNOSIS — I75021 Atheroembolism of right lower extremity: Secondary | ICD-10-CM | POA: Diagnosis not present

## 2018-02-17 DIAGNOSIS — I70269 Atherosclerosis of native arteries of extremities with gangrene, unspecified extremity: Secondary | ICD-10-CM

## 2018-02-17 DIAGNOSIS — I739 Peripheral vascular disease, unspecified: Secondary | ICD-10-CM | POA: Diagnosis not present

## 2018-02-17 NOTE — Progress Notes (Signed)
[facility] [place]   CC: Follow up peripheral artery occlusive disease  History of Present Illness Angela Alvarez is a 62 y.o. 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 This was documented on CT scan and by angiogram. 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 She has had no problems since then other than some occasional pins and needle sensation in the right first toe bed. She denies any claudication symptoms. She has now been tobacco freesince 2017 and quit with the assistance of Chantix. She is on aspirin and Lipitor.  Dr. Darrick Penna last evaluated pt on 07-01-16. At that timepatientwasdoing well,most likely had an atheroembolic event from ruptured plaque in the aorta. This is been asymptomatic and stable for over a year. We have previously discussed whether or not to remove this plaque but since she has now been stable for some time and do not believe he needs any intervention at this point. Plan: The patient was tocontinue her antiplatelet and statin agent. She was tofollow-up with [place] in one year for repeat ABIs.  She states her feet hurt when she walks, and this limits her walking, along with torn meniscus that occurred in December 2018, slid when bowling, is seeing ortho, Dr. Valma Cava. 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   She denies any known hx of stroke or TIA. She denies any known heart problems.    Diabetic: No Tobacco ZOX:WRUEAV smoker, quitin 2017  Pt meds include: Statin :Yes Betablocker:No ASA:Yes, started taking in February 2019 Other anticoagulants/antiplatelets:no, she stopped Plavix taking after a month's use after right great toe amputation. She stopped due to excessive bruising. Pt states she told her PCP she stopped the Plavix.    Past Medical  History:  Diagnosis Date  . Abnormal Pap smear of cervix 06-01-13   ascus pap:pos.HR HPV/hx of cryotherapy to cx 1985  . Asthma   . COPD (chronic obstructive pulmonary disease) (HCC)   . Critical lower limb ischemia    ischemic appearing left first toe  . Peripheral vascular disease [facility])     Social History Social History   Tobacco Use  . Smoking status: Former Smoker    Packs/day: 1.00    Types: Cigarettes    Last attempt to quit: 06/07/2015    Years since quitting: 2.7  . Smokeless tobacco: Never Used  Substance Use Topics  . Alcohol use: No    Alcohol/week: 0.0 standard drinks  . Drug use: No    Family History Family History  Problem Relation Age of Onset  . Stroke Mother        dec age 67  . Hypertension Mother   . Cancer Father        lung age 76  . Cancer Sister        dec colon ca age 11  . Hypertension Sister   . Diabetes Neg Hx   . Heart attack Neg Hx   . Hyperlipidemia Neg Hx   . Sudden death Neg Hx     Past Surgical History:  Procedure Laterality Date  . AMPUTATION Right 05/19/2015   Procedure: RIGHT FIRST RAY AMPUTATION;  Surgeon: Sherren Kerns, MD;  Location: [facility];  Service: Vascular;  Laterality: Right;  . BREAST SURGERY Right   . BUNIONECTOMY     bilateral  . CARDIAC CATHETERIZATION Bilateral  Catarct  . COLONOSCOPY W/ POLYPECTOMY    . KNEE ARTHROPLASTY Right 07/21/2017  . PERIPHERAL VASCULAR CATHETERIZATION N/A 04/28/2015   Procedure: Lower Extremity Angiography;  Surgeon: Runell Gess, MD;  Location: [facility] INVASIVE CV LAB;  Service: Cardiovascular;  Laterality: N/A;  . surgery under arm Right    --benign breast tissue    Allergies  Allergen Reactions  . Amoxicillin-Pot Clavulanate Nausea And Vomiting    NAUSEA AND VOMITING NAUSEA AND VOMITING   . Levofloxacin Nausea And Vomiting    Current Outpatient Medications  Medication Sig Dispense Refill  . albuterol (PROVENTIL,VENTOLIN) 90 MCG/ACT inhaler Inhale 2 puffs into the lungs  every 4 (four) hours as needed for wheezing or shortness of breath.     Marland Kitchen aspirin 325 MG tablet Take 325 mg by mouth daily. Reported on 06/12/2015    . atorvastatin (LIPITOR) 40 MG tablet     . budesonide-formoterol (SYMBICORT) 160-4.5 MCG/ACT inhaler Inhale 2 puffs into the lungs 2 (two) times daily.      . clobetasol cream (TEMOVATE) 0.05 % APPLY TOPICALLY TWICE WEEKLY 30 g 0  . clopidogrel (PLAVIX) 75 MG tablet Take 1 tablet (75 mg total) by mouth daily with breakfast. 30 tablet 11  . ondansetron (ZOFRAN) 4 MG tablet Take 1 tablet (4 mg total) by mouth every 6 (six) hours. 60 tablet 1  . varenicline (CHANTIX) 1 MG tablet Take 1 mg by mouth 2 (two) times daily.     No current facility-administered medications for this visit.     ROS: See HPI for pertinent positives and negatives.   Physical Examination  Vitals:   02/17/18 1556  BP: (!) 167/87  Pulse: 87  Resp: 18  SpO2: 96%  Weight: 187 lb (84.8 kg)  Height: 5\' 4"  (1.626 m)   Body mass index is 32.1 kg/m.  General: A&O x 3, WDWN,obesefemale. Gait:normal HENT: No gross abnormalities.  Eyes: PERRLA. Pulmonary: Respirations are non labored, CTAB, good air movementin all fields Cardiac:regularrhythm, no detected murmur.    Carotid Bruits Right Left   Negative Negative   Radial pulses are2+palpable bilaterally  Adominal aortic pulse isnotpalpable   VASCULAR EXAM: Extremitieswithoutischemic changes, withoutGangrene; with callus formed over previous open ulcer at right lateral/posterior heelwound, callus has contracted to 0.5 cm.Right 1st toe is surgically absent.  LE Pulses Right Left  FEMORAL 1+palpable 2+palpable   POPLITEAL notpalpable  notpalpable  POSTERIOR TIBIAL notpalpable  notpalpable   DORSALIS PEDIS ANTERIOR TIBIAL notpalpable  1+palpable    Abdomen: soft, NT, no palpable masses. Skin: no rashes, no cellulitis, see  Extremities Musculoskeletal: no muscle wasting or atrophy. Neurologic: A&O X 3; appropriate affect, Sensation is normal; MOTOR FUNCTION: moving all extremities equally, motor strength 5/5 throughout. Speech is fluent/normal. CN 2-12 intact. Psychiatric: Thought content is normal, mood appropriate for clinical situation. Loquacious.      ASSESSMENT: Angela Alvarez is a 62 y.o. 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 This was documented on CT scan and by angiogram. 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 She has had no problems since then other than some occasional pins and needle sensation in the right first toe bed. She denies any claudication symptoms. There are no signs of ischemia in her feet or legs.  At her 10-13-17 visit I discussed with Dr. Darrick Penna that pthas a healing wound on her right heel, has a 2+ palpable  right PT pulse, had a 90% stenosis in right SFA on 2017 angiogram by Dr. Allyson SabalBerry, and some abdominal aortic stenosis, and that pt was attending a wound care center for her healing right heel ulcer.  Since she had excessive bruising with the use of Plavix, does not need to continue taking Plavix (discussed with Dr. Darrick PennaFields).  Continue taking 81 mg ASA daily.  Pt desires sclerotherapy for her multiple spider leg veins; Lilly gave Clementeen HoofLiz Wert RN contact information to discuss at a previous visit.   Her last visit to the wound care center was 10-19-17, Dr. Jannetta Quintobson's note as follows:  "#1 I think the patient to be discharged #2 I'm not exactly sure how this wound began or the exact etiology. She has a history of cholesterol emboli but I'm doubtful that's what this was although it certainly possible she wasn't taking any of her medications. At our suggestion she is back on aspirin. #3 I recommended keeping this covered with a thick  Band-Aid or foam and to keep this offloaded by wearing open heeled shoes like she's been doing for at least the next month"  Her right heel ulcer has improved to a small callus.  Her knee pain limits her walking, right heel callus is also painful when pressure applied.   DATA  ABI (Date: 02/17/2018):  R:   ABI: 0.87 (was 0.93 on 11-18-17),   PT: tri  DP: tri  TBI:  Right great toe is surgically absent  L:   ABI: 1.11 (was 1.07),   PT: tri  DP: tri  TBI: 0.62, toe pressure 96, (was 0.57) Right ABI remains stable with mild disease, left ABI remains normal. All triphasic waveforms bilaterally.     PLAN:  Based on the patient's vascular studies and examination, pt will return to clinic in329months with ABI's. I advised her to notify us if she develops concerns re the circulation in her feet or legs.  Daily seated leg exercises discussed and demonstrated.   I discussed in depth with the patient the nature of atherosclerosis, and emphasized the importance of maximal medical management including strict control of blood pressure, blood glucose, and lipid levels, obtaining regular exercise, and continued cessation of smoking.  The patient is aware that without maximal medical management the underlying atherosclerotic disease process will progress, limiting the benefit of any interventions.  The patient was given information about PAD including signs, symptoms, treatment, what symptoms should prompt the patient to seek immediate medical care, and risk reduction measures to take.  Charisse MarchSuzanne Francis Doenges, RN, MSN, FNP-C [facility] [place] Office Phone: [phone]  Clinic MD: Randie HeinzCain  02/17/18 3:59 PM

## 2018-02-17 NOTE — Patient Instructions (Signed)

## 2018-08-11 ENCOUNTER — Ambulatory Visit (INDEPENDENT_AMBULATORY_CARE_PROVIDER_SITE_OTHER): Payer: 59 | Admitting: Vascular Surgery

## 2018-08-11 ENCOUNTER — Encounter: Payer: Self-pay | Admitting: Vascular Surgery

## 2018-08-11 ENCOUNTER — Other Ambulatory Visit: Payer: Self-pay

## 2018-08-11 ENCOUNTER — Encounter: Payer: Self-pay | Admitting: *Deleted

## 2018-08-11 VITALS — BP 161/91 | HR 82 | Temp 97.4°F | Resp 20 | Ht 64.0 in | Wt 170.0 lb

## 2018-08-11 DIAGNOSIS — I75021 Atheroembolism of right lower extremity: Secondary | ICD-10-CM

## 2018-08-11 DIAGNOSIS — Z87891 Personal history of nicotine dependence: Secondary | ICD-10-CM | POA: Diagnosis not present

## 2018-08-11 DIAGNOSIS — I779 Disorder of arteries and arterioles, unspecified: Secondary | ICD-10-CM | POA: Diagnosis not present

## 2018-08-11 DIAGNOSIS — Z89411 Acquired absence of right great toe: Secondary | ICD-10-CM

## 2018-08-11 NOTE — Progress Notes (Signed)
Patient ID: Angela Alvarez, female   DOB: [birth year], 63 y.o.   MRN: [id number]  Reason for Consult: Wound Check   Referred by Pearson Grippe, MD  Subjective:     HPI:  Angela Alvarez is a 63 y.o. female with a history of blue toe syndrome and underwent right first toe amputation in 2017.  She has been followed since that time has never had vascular invention but did undergo angiogram in January 2017.  More recently she is been concerned she has infection of the great toe amputation site.  She developed a small scab and has had significant pain in that site with some purple discoloration.  She is able to walk has not had any new claudication.  Other toes are not giving her issues.  States left leg is always been okay.  Could not take Plavix currently on aspirin and statin.  Past Medical History:  Diagnosis Date  . Abnormal Pap smear of cervix 06-01-13   ascus pap:pos.HR HPV/hx of cryotherapy to cx 1985  . Asthma   . COPD (chronic obstructive pulmonary disease) (HCC)   . Critical lower limb ischemia    ischemic appearing left first toe  . Peripheral vascular disease (HCC)    Family History  Problem Relation Age of Onset  . Stroke Mother        dec age 43  . Hypertension Mother   . Cancer Father        lung age 76  . Cancer Sister        dec colon ca age 71  . Hypertension Sister   . Diabetes Neg Hx   . Heart attack Neg Hx   . Hyperlipidemia Neg Hx   . Sudden death Neg Hx    Past Surgical History:  Procedure Laterality Date  . AMPUTATION Right 05/19/2015   Procedure: RIGHT FIRST RAY AMPUTATION;  Surgeon: Sherren Kerns, MD;  Location: [facility] OR;  Service: Vascular;  Laterality: Right;  . BREAST SURGERY Right   . BUNIONECTOMY     bilateral  . CARDIAC CATHETERIZATION Bilateral    Catarct  . COLONOSCOPY W/ POLYPECTOMY    . KNEE ARTHROPLASTY Right 07/21/2017  . PERIPHERAL VASCULAR CATHETERIZATION N/A 04/28/2015   Procedure: Lower Extremity Angiography;  Surgeon: Runell Gess, MD;  Location: [facility] INVASIVE CV LAB;  Service: Cardiovascular;  Laterality: N/A;  . surgery under arm Right    --benign breast tissue    Short Social History:  Social History   Tobacco Use  . Smoking status: Former Smoker    Packs/day: 1.00    Types: Cigarettes    Last attempt to quit: 06/07/2015    Years since quitting: 3.1  . Smokeless tobacco: Never Used  Substance Use Topics  . Alcohol use: No    Alcohol/week: 0.0 standard drinks    Allergies  Allergen Reactions  . Amoxicillin-Pot Clavulanate Nausea And Vomiting    NAUSEA AND VOMITING NAUSEA AND VOMITING   . Levofloxacin Nausea And Vomiting    Current Outpatient Medications  Medication Sig Dispense Refill  . albuterol (PROVENTIL,VENTOLIN) 90 MCG/ACT inhaler Inhale 2 puffs into the lungs every 4 (four) hours as needed for wheezing or shortness of breath.     Marland Kitchen aspirin EC 81 MG tablet Take 81 mg by mouth daily.    . budesonide-formoterol (SYMBICORT) 160-4.5 MCG/ACT inhaler Inhale 2 puffs into the lungs 2 (two) times daily.      Marland Kitchen atorvastatin (LIPITOR) 40 MG tablet     .  clobetasol cream (TEMOVATE) 0.05 % APPLY TOPICALLY TWICE WEEKLY (Patient not taking: Reported on 08/11/2018) 30 g 0   No current facility-administered medications for this visit.     Review of Systems  Constitutional:  Constitutional negative. HENT: HENT negative.  Eyes: Eyes negative.  Respiratory: Respiratory negative.  Cardiovascular: Cardiovascular negative.  GI: Gastrointestinal negative.  Musculoskeletal:       Right great toe amputation site pain Skin: Positive for rash and wound.  Neurological: Neurological negative. Hematologic: Hematologic/lymphatic negative.  Psychiatric: Psychiatric negative.        Objective:  Objective   Vitals:   08/11/18 1124  BP: (!) 161/91  Pulse: 82  Resp: 20  Temp: (!) 97.4 F (36.3 C)  SpO2: 97%  Weight: 170 lb (77.1 kg)  Height: 5\' 4"  (1.626 m)   Body mass index is 29.18 kg/m.  Physical  Exam Constitutional:      Appearance: Normal appearance.  Neck:     Musculoskeletal: Neck supple.  Cardiovascular:     Rate and Rhythm: Regular rhythm.     Pulses:          Carotid pulses are 2+ on the right side and 2+ on the left side.      Radial pulses are 2+ on the right side and 2+ on the left side.       Femoral pulses are 2+ on the right side and 2+ on the left side.      Popliteal pulses are 0 on the right side.       Dorsalis pedis pulses are 0 on the right side.       Posterior tibial pulses are 0 on the right side.  Pulmonary:     Effort: Pulmonary effort is normal.  Abdominal:     General: Abdomen is flat.     Palpations: Abdomen is soft. There is no mass.  Musculoskeletal:        General: No swelling.     Comments: At the site of her previous first toe amputation site there is a 1 cm scabbed area which is very tender to the touch.  Skin:    General: Skin is warm.     Capillary Refill: Capillary refill takes 2 to 3 seconds.  Neurological:     General: No focal deficit present.     Mental Status: She is alert.  Psychiatric:        Mood and Affect: Mood normal.        Thought Content: Thought content normal.        Judgment: Judgment normal.     Data: No studies today.  Previous ABIs reviewed     Assessment/Plan:     63 year old female previous right great toe amputation.  Now having significant pain and small ulceration of the great toe with pulses that are nonpalpable.  We will begin with right lower extremity angiogram.  Will possibly need further amputation of the right great toe.  She demonstrates good understanding we will get her scheduled today.     Maeola HarmanBrandon Christopher Baruch Lewers MD [facility]

## 2018-08-17 ENCOUNTER — Other Ambulatory Visit: Payer: Self-pay | Admitting: Vascular Surgery

## 2018-08-17 ENCOUNTER — Encounter: Payer: Self-pay | Admitting: Vascular Surgery

## 2018-09-08 ENCOUNTER — Other Ambulatory Visit
Admission: RE | Admit: 2018-09-08 | Discharge: 2018-09-08 | Disposition: A | Discharge status: Home / Self Care | Payer: 59 | Source: Ambulatory Visit | Attending: Vascular Surgery | Admitting: Vascular Surgery

## 2018-09-08 ENCOUNTER — Other Ambulatory Visit: Payer: Self-pay

## 2018-09-08 DIAGNOSIS — Z01812 Encounter for preprocedural laboratory examination: Secondary | ICD-10-CM | POA: Diagnosis present

## 2018-09-08 DIAGNOSIS — Z1159 Encounter for screening for other viral diseases: Secondary | ICD-10-CM | POA: Insufficient documentation

## 2018-09-08 LAB — SARS CORONAVIRUS 2 BY RT PCR (HOSPITAL ORDER, PERFORMED IN ~~LOC~~ HOSPITAL LAB): SARS Coronavirus 2: NEGATIVE

## 2018-09-11 ENCOUNTER — Encounter (HOSPITAL_COMMUNITY): Payer: Self-pay | Admitting: Vascular Surgery

## 2018-09-11 ENCOUNTER — Encounter (HOSPITAL_COMMUNITY)
Admission: RE | Disposition: A | Discharge status: Home / Self Care | Payer: Self-pay | Source: Home / Self Care | Attending: Vascular Surgery

## 2018-09-11 ENCOUNTER — Other Ambulatory Visit: Payer: Self-pay

## 2018-09-11 ENCOUNTER — Ambulatory Visit (HOSPITAL_COMMUNITY)
Admission: RE | Admit: 2018-09-11 | Discharge: 2018-09-11 | Disposition: A | Discharge status: Home / Self Care | Payer: 59 | Attending: Vascular Surgery | Admitting: Vascular Surgery

## 2018-09-11 DIAGNOSIS — Z87891 Personal history of nicotine dependence: Secondary | ICD-10-CM | POA: Insufficient documentation

## 2018-09-11 DIAGNOSIS — Z79899 Other long term (current) drug therapy: Secondary | ICD-10-CM | POA: Diagnosis not present

## 2018-09-11 DIAGNOSIS — Z7982 Long term (current) use of aspirin: Secondary | ICD-10-CM | POA: Diagnosis not present

## 2018-09-11 DIAGNOSIS — Z7951 Long term (current) use of inhaled steroids: Secondary | ICD-10-CM | POA: Insufficient documentation

## 2018-09-11 DIAGNOSIS — Z823 Family history of stroke: Secondary | ICD-10-CM | POA: Insufficient documentation

## 2018-09-11 DIAGNOSIS — Z89411 Acquired absence of right great toe: Secondary | ICD-10-CM | POA: Diagnosis not present

## 2018-09-11 DIAGNOSIS — J449 Chronic obstructive pulmonary disease, unspecified: Secondary | ICD-10-CM | POA: Diagnosis not present

## 2018-09-11 DIAGNOSIS — Z881 Allergy status to other antibiotic agents status: Secondary | ICD-10-CM | POA: Insufficient documentation

## 2018-09-11 DIAGNOSIS — L97519 Non-pressure chronic ulcer of other part of right foot with unspecified severity: Secondary | ICD-10-CM | POA: Diagnosis not present

## 2018-09-11 DIAGNOSIS — Z8249 Family history of ischemic heart disease and other diseases of the circulatory system: Secondary | ICD-10-CM | POA: Insufficient documentation

## 2018-09-11 DIAGNOSIS — I70235 Atherosclerosis of native arteries of right leg with ulceration of other part of foot: Secondary | ICD-10-CM | POA: Insufficient documentation

## 2018-09-11 DIAGNOSIS — Z880 Allergy status to penicillin: Secondary | ICD-10-CM | POA: Diagnosis not present

## 2018-09-11 HISTORY — PX: PERIPHERAL VASCULAR INTERVENTION: CATH118257

## 2018-09-11 HISTORY — PX: ABDOMINAL AORTOGRAM W/LOWER EXTREMITY: CATH118223

## 2018-09-11 LAB — POCT I-STAT, CHEM 8
BUN: 10 mg/dL (ref 8–23)
Calcium, Ion: 1.19 mmol/L (ref 1.15–1.40)
Chloride: 104 mmol/L (ref 98–111)
Creatinine, Ser: 0.6 mg/dL (ref 0.44–1.00)
Glucose, Bld: 106 mg/dL — ABNORMAL HIGH (ref 70–99)
HCT: 39 % (ref 36.0–46.0)
Hemoglobin: 13.3 g/dL (ref 12.0–15.0)
Potassium: 4 mmol/L (ref 3.5–5.1)
Sodium: 141 mmol/L (ref 135–145)
TCO2: 27 mmol/L (ref 22–32)

## 2018-09-11 SURGERY — ABDOMINAL AORTOGRAM W/LOWER EXTREMITY
Anesthesia: LOCAL | Laterality: Left

## 2018-09-11 MED ORDER — SODIUM CHLORIDE 0.9% FLUSH: 3.0000 mL | Freq: Two times a day (BID) | INTRAVENOUS | Status: DC

## 2018-09-11 MED ORDER — ROSUVASTATIN CALCIUM 10 MG PO TABS: 10.0000 mg | ORAL_TABLET | Freq: Every day | ORAL | 11 refills | Status: DC

## 2018-09-11 MED ORDER — HYDROMORPHONE HCL 1 MG/ML IJ SOLN: 0.5000 mg | INTRAMUSCULAR | Status: DC | PRN

## 2018-09-11 MED ORDER — LIDOCAINE HCL (PF) 1 % IJ SOLN
INTRAMUSCULAR | Status: AC
  Filled 2018-09-11: qty 30

## 2018-09-11 MED ORDER — ROSUVASTATIN CALCIUM 10 MG PO TABS: 10.0000 mg | ORAL_TABLET | Freq: Every day | ORAL | Status: DC

## 2018-09-11 MED ORDER — HEPARIN (PORCINE) IN NACL 1000-0.9 UT/500ML-% IV SOLN
INTRAVENOUS | Status: DC | PRN
  Administered 2018-09-11 (×2): 500 mL

## 2018-09-11 MED ORDER — FENTANYL CITRATE (PF) 100 MCG/2ML IJ SOLN
INTRAMUSCULAR | Status: AC
  Filled 2018-09-11: qty 2

## 2018-09-11 MED ORDER — NITROGLYCERIN 1 MG/10 ML FOR IR/CATH LAB
INTRA_ARTERIAL | Status: AC
  Filled 2018-09-11: qty 10

## 2018-09-11 MED ORDER — HYDRALAZINE HCL 20 MG/ML IJ SOLN: 5.0000 mg | INTRAMUSCULAR | Status: DC | PRN

## 2018-09-11 MED ORDER — SODIUM CHLORIDE 0.9% FLUSH: 3.0000 mL | INTRAVENOUS | Status: DC | PRN

## 2018-09-11 MED ORDER — LIDOCAINE HCL (PF) 1 % IJ SOLN
INTRAMUSCULAR | Status: DC | PRN
  Administered 2018-09-11: 10 mL

## 2018-09-11 MED ORDER — HEPARIN (PORCINE) IN NACL 1000-0.9 UT/500ML-% IV SOLN
INTRAVENOUS | Status: AC
  Filled 2018-09-11: qty 1000

## 2018-09-11 MED ORDER — LABETALOL HCL 5 MG/ML IV SOLN: 10.0000 mg | INTRAVENOUS | Status: DC | PRN

## 2018-09-11 MED ORDER — IODIXANOL 320 MG/ML IV SOLN
INTRAVENOUS | Status: DC | PRN
  Administered 2018-09-11: 160 mL via INTRA_ARTERIAL

## 2018-09-11 MED ORDER — HEPARIN SODIUM (PORCINE) 1000 UNIT/ML IJ SOLN
INTRAMUSCULAR | Status: DC | PRN
  Administered 2018-09-11: 8000 [IU] via INTRAVENOUS

## 2018-09-11 MED ORDER — SODIUM CHLORIDE 0.9 % IV SOLN: INTRAVENOUS | Status: DC

## 2018-09-11 MED ORDER — FENTANYL CITRATE (PF) 100 MCG/2ML IJ SOLN
INTRAMUSCULAR | Status: DC | PRN
  Administered 2018-09-11: 25 ug via INTRAVENOUS
  Administered 2018-09-11: 50 ug via INTRAVENOUS

## 2018-09-11 MED ORDER — ASPIRIN EC 325 MG PO TBEC: 325.0000 mg | DELAYED_RELEASE_TABLET | Freq: Every day | ORAL | Status: DC

## 2018-09-11 MED ORDER — OXYCODONE HCL 5 MG PO TABS: 5.0000 mg | ORAL_TABLET | ORAL | Status: DC | PRN

## 2018-09-11 MED ORDER — ACETAMINOPHEN 325 MG PO TABS: 650.0000 mg | ORAL_TABLET | ORAL | Status: DC | PRN

## 2018-09-11 MED ORDER — MIDAZOLAM HCL 2 MG/2ML IJ SOLN
INTRAMUSCULAR | Status: DC | PRN
  Administered 2018-09-11 (×2): 1 mg via INTRAVENOUS

## 2018-09-11 MED ORDER — MIDAZOLAM HCL 2 MG/2ML IJ SOLN
INTRAMUSCULAR | Status: AC
  Filled 2018-09-11: qty 2

## 2018-09-11 MED ORDER — ONDANSETRON HCL 4 MG/2ML IJ SOLN: 4.0000 mg | Freq: Four times a day (QID) | INTRAMUSCULAR | Status: DC | PRN

## 2018-09-11 MED ORDER — SODIUM CHLORIDE 0.9 % IV SOLN: 250.0000 mL | INTRAVENOUS | Status: DC | PRN

## 2018-09-11 MED ORDER — ASPIRIN 325 MG PO TABS
ORAL_TABLET | ORAL | Status: AC
  Filled 2018-09-11: qty 1

## 2018-09-11 MED ORDER — NITROGLYCERIN 1 MG/10 ML FOR IR/CATH LAB
INTRA_ARTERIAL | Status: DC | PRN
  Administered 2018-09-11: 200 ug via INTRA_ARTERIAL

## 2018-09-11 MED ORDER — SODIUM CHLORIDE 0.9 % IV SOLN
INTRAVENOUS | Status: DC
  Administered 2018-09-11: 07:00:00 via INTRAVENOUS

## 2018-09-11 SURGICAL SUPPLY — 19 items
BALLN MUSTANG 5.0X40 135 (BALLOONS) ×3
BALLOON MUSTANG 5.0X40 135 (BALLOONS) ×2 IMPLANT
CATH OMNI FLUSH 5F 65CM (CATHETERS) ×3 IMPLANT
CATH QUICKCROSS SUPP .035X90CM (MICROCATHETER) ×3 IMPLANT
CLOSURE MYNX CONTROL 6F/7F (Vascular Products) ×3 IMPLANT
DEVICE TORQUE .025-.038 (MISCELLANEOUS) ×3 IMPLANT
GLIDEWIRE ADV .035X260CM (WIRE) ×3 IMPLANT
KIT ENCORE 26 ADVANTAGE (KITS) ×3 IMPLANT
KIT MICROPUNCTURE NIT STIFF (SHEATH) ×3 IMPLANT
KIT PV (KITS) ×3 IMPLANT
SHEATH HIGHFLEX ANSEL 6FRX55 (SHEATH) ×3 IMPLANT
SHEATH PINNACLE 5F 10CM (SHEATH) ×3 IMPLANT
SHEATH PINNACLE 6F 10CM (SHEATH) ×3 IMPLANT
SHEATH PROBE COVER 6X72 (BAG) ×3 IMPLANT
STENT ELUVIA 6X40X130 (Permanent Stent) ×3 IMPLANT
SYR MEDRAD MARK V 150ML (SYRINGE) ×3 IMPLANT
TRANSDUCER W/STOPCOCK (MISCELLANEOUS) ×3 IMPLANT
TRAY PV CATH (CUSTOM PROCEDURE TRAY) ×3 IMPLANT
WIRE BENTSON .035X145CM (WIRE) ×3 IMPLANT

## 2018-09-11 NOTE — Discharge Instructions (Signed)
Femoral Site Care °This sheet gives you information about how to care for yourself after your procedure. Your health care provider may also give you more specific instructions. If you have problems or questions, contact your health care provider. °What can I expect after the procedure? °After the procedure, it is common to have: °· Bruising that usually fades within 1-2 weeks. °· Tenderness at the site. °Follow these instructions at home: °Wound care °· Follow instructions from your health care provider about how to take care of your insertion site. Make sure you: °? Wash your hands with soap and water before you change your bandage (dressing). If soap and water are not available, use hand sanitizer. °? Change your dressing as told by your health care provider. °? Leave stitches (sutures), skin glue, or adhesive strips in place. These skin closures may need to stay in place for 2 weeks or longer. If adhesive strip edges start to loosen and curl up, you may trim the loose edges. Do not remove adhesive strips completely unless your health care provider tells you to do that. °· Do not take baths, swim, or use a hot tub until your health care provider approves. °· You may shower 24-48 hours after the procedure or as told by your health care provider. °? Gently wash the site with plain soap and water. °? Pat the area dry with a clean towel. °? Do not rub the site. This may cause bleeding. °· Do not apply powder or lotion to the site. Keep the site clean and dry. °· Check your femoral site every day for signs of infection. Check for: °? Redness, swelling, or pain. °? Fluid or blood. °? Warmth. °? Pus or a bad smell. °Activity °· For the first 2-3 days after your procedure, or as long as directed: °? Avoid climbing stairs as much as possible. °? Do not squat. °· Do not lift anything that is heavier than 10 lb (4.5 kg), or the limit that you are told, until your health care provider says that it is safe. °· Rest as  directed. °? Avoid sitting for a long time without moving. Get up to take short walks every 1-2 hours. °· Do not drive for 24 hours if you were given a medicine to help you relax (sedative). °General instructions °· Take over-the-counter and prescription medicines only as told by your health care provider. °· Keep all follow-up visits as told by your health care provider. This is important. °Contact a health care provider if you have: °· A fever or chills. °· You have redness, swelling, or pain around your insertion site. °Get help right away if: °· The catheter insertion area swells very fast. °· You pass out. °· You suddenly start to sweat or your skin gets clammy. °· The catheter insertion area is bleeding, and the bleeding does not stop when you hold steady pressure on the area. °· The area near or just beyond the catheter insertion site becomes pale, cool, tingly, or numb. °These symptoms may represent a serious problem that is an emergency. Do not wait to see if the symptoms will go away. Get medical help right away. Call your local emergency services (911 in the [place] Do not drive yourself to the hospital. °Summary °· After the procedure, it is common to have bruising that usually fades within 1-2 weeks. °· Check your femoral site every day for signs of infection. °· Do not lift anything that is heavier than 10 lb (4.5 kg), or the   limit that you are told, until your health care provider says that it is safe. °This information is not intended to replace advice given to you by your health care provider. Make sure you discuss any questions you have with your health care provider. °Document Released: 11/23/2013 Document Revised: 04/04/2017 Document Reviewed: 04/04/2017 °[employer]vier Interactive Patient Education © 2019 [employer] ° °

## 2018-09-11 NOTE — H&P (Signed)
Patient ID: Angela Alvarez, female   DOB: June 24, 1955, 63 y.o.   MRN: [id number]  Reason for Consult: Wound Check   Referred by Jani Gravel, MD  Subjective:    Subjective    HPI:  Angela Alvarez is a 63 y.o. female with a history of blue toe syndrome and underwent right first toe amputation in 2017.  She has been followed since that time has never had vascular invention but did undergo angiogram in January 2017.  More recently she is been concerned she has infection of the great toe amputation site.  She developed a small scab and has had significant pain in that site with some purple discoloration.  She is able to walk has not had any new claudication.  Other toes are not giving her issues.  States left leg is always been okay.  Could not take Plavix currently on aspirin and statin.      Past Medical History:  Diagnosis Date  . Abnormal Pap smear of cervix 06-01-13   ascus pap:pos.HR HPV/hx of cryotherapy to cx 1985  . Asthma   . COPD (chronic obstructive pulmonary disease) ([place])   . Critical lower limb ischemia    ischemic appearing left first toe  . Peripheral vascular disease ([place])         Family History  Problem Relation Age of Onset  . Stroke Mother        dec age 70  . Hypertension Mother   . Cancer Father        lung age 77  . Cancer Sister        dec colon ca age 39  . Hypertension Sister   . Diabetes Neg Hx   . Heart attack Neg Hx   . Hyperlipidemia Neg Hx   . Sudden death Neg Hx         Past Surgical History:  Procedure Laterality Date  . AMPUTATION Right 05/19/2015   Procedure: RIGHT FIRST RAY AMPUTATION;  Surgeon: Elam Dutch, MD;  Location: [facility];  Service: Vascular;  Laterality: Right;  . BREAST SURGERY Right   . BUNIONECTOMY     bilateral  . CARDIAC CATHETERIZATION Bilateral    Catarct  . COLONOSCOPY W/ POLYPECTOMY    . KNEE ARTHROPLASTY Right 07/21/2017  . PERIPHERAL VASCULAR CATHETERIZATION N/A 04/28/2015    Procedure: Lower Extremity Angiography;  Surgeon: Lorretta Harp, MD;  Location: [facility];  Service: Cardiovascular;  Laterality: N/A;  . surgery under arm Right    --benign breast tissue    Short Social History:  Social History        Tobacco Use  . Smoking status: Former Smoker    Packs/day: 1.00    Types: Cigarettes    Last attempt to quit: 06/07/2015    Years since quitting: 3.1  . Smokeless tobacco: Never Used  Substance Use Topics  . Alcohol use: No    Alcohol/week: 0.0 standard drinks         Allergies  Allergen Reactions  . Amoxicillin-Pot Clavulanate Nausea And Vomiting    NAUSEA AND VOMITING NAUSEA AND VOMITING   . Levofloxacin Nausea And Vomiting          Current Outpatient Medications  Medication Sig Dispense Refill  . albuterol (PROVENTIL,VENTOLIN) 90 MCG/ACT inhaler Inhale 2 puffs into the lungs every 4 (four) hours as needed for wheezing or shortness of breath.     Marland Kitchen aspirin EC 81 MG tablet Take 81 mg by mouth daily.    Marland Kitchen  budesonide-formoterol (SYMBICORT) 160-4.5 MCG/ACT inhaler Inhale 2 puffs into the lungs 2 (two) times daily.      Marland Kitchen. atorvastatin (LIPITOR) 40 MG tablet     . clobetasol cream (TEMOVATE) 0.05 % APPLY TOPICALLY TWICE WEEKLY (Patient not taking: Reported on 08/11/2018) 30 g 0   No current facility-administered medications for this visit.     Review of Systems  Constitutional:  Constitutional negative. HENT: HENT negative.  Eyes: Eyes negative.  Respiratory: Respiratory negative.  Cardiovascular: Cardiovascular negative.  GI: Gastrointestinal negative.  Musculoskeletal:       Right great toe amputation site pain Skin: Positive for rash and wound.  Neurological: Neurological negative. Hematologic: Hematologic/lymphatic negative.  Psychiatric: Psychiatric negative.        Objective:   Vitals:   09/11/18 0543 09/11/18 0553  BP:  (!) 182/75  Pulse: 96   Temp:  98.2 F (36.8 C)   SpO2: 98%      Physical Exam Constitutional:      Appearance: Normal appearance.  Neck:     Musculoskeletal: Neck supple.  Cardiovascular:     Rate and Rhythm: Regular rhythm.     Pulses:          Carotid pulses are 2+ on the right side and 2+ on the left side.      Radial pulses are 2+ on the right side and 2+ on the left side.       Femoral pulses are 2+ on the right side and 2+ on the left side.      Popliteal pulses are 0 on the right side.       Dorsalis pedis pulses are 0 on the right side.       Posterior tibial pulses are 0 on the right side.  Pulmonary:     Effort: Pulmonary effort is normal.  Abdominal:     General: Abdomen is flat.     Palpations: Abdomen is soft. There is no mass.  Musculoskeletal:        General: No swelling.     Comments: At the site of her previous first toe amputation site there is a 1 cm scabbed area which is very tender to the touch.  Skin:    General: Skin is warm.     Capillary Refill: Capillary refill takes 2 to 3 seconds.  Neurological:     General: No focal deficit present.     Mental Status: She is alert.  Psychiatric:        Mood and Affect: Mood normal.        Thought Content: Thought content normal.        Judgment: Judgment normal.         Assessment/Plan:   Assessment    63 year old female previous right great toe amputation.  Now having significant pain and small ulceration of the great toe with pulses that are nonpalpable.  We will begin with right lower extremity angiogram.  Will possibly need further amputation of the right great toe.    Kaisey Huseby C. Randie Heinzain, MD [facility] Office: [phone] Pager: [phone]

## 2018-09-11 NOTE — Op Note (Signed)
    Patient name: Angela Alvarez MRN: [id number] DOB: 1956/03/09 Sex: female  09/11/2018 Pre-operative Diagnosis: Right great toe ulceration Post-operative diagnosis:  Same Surgeon:  Erlene Quan C. Donzetta Matters, MD Procedure Performed: 1.  Ultrasound-guided cannulation left common femoral artery 2.  Aortogram with bilateral lower extremity runoff 3.  Stent of right SFA with 6 x 74mm elluvia 4.  Minx device closure left common femoral artery 5.  Moderate sedation with fentanyl and Versed for 41 minutes  Indications: 63 year old female has previously undergone right great toe amputation leaving her in the first metatarsal phalangeal joint.  She now has ulceration there with out having any trauma.  She does not have palpable pulses indicated for angiogram possible intervention.  Findings: Aorta has persistent calcific disease however is not flow-limiting.  The distal SFA has approximately 90% stenosis.  After stenting this is resolved to 0.  After stenting the dominant runoff vessel which had been the posterior tibial initially was absent but after administrating 200 mcg nitroglycerin it was visible and no intervention was undertaken.  Left side has some calcific disease in the SFA which is non-flow-limiting.  The dominant runoff is via the anterior tibial artery on the left.   Procedure:  The patient was identified in the holding area and taken to room 8.  The patient was then placed supine on the table and prepped and draped in the usual sterile fashion.  A time out was called.  Ultrasound was used to evaluate the left common femoral artery which was noted to be patent.  This was cannulated with micropuncture needle using direct ultrasound visualization.  An image was saved the permanent record.  Bentson wire was placed followed by 5 [place] sheath.  Patient was administered Versed and fentanyl and her vital signs were monitored throughout the case.  Omni Flush catheter was placed to level L1 aortogram followed  bilateral lower extremity runoff was performed.  We elected to intervene on the right SFA.  We crossed the bifurcation using Omni a Bentson and a long 6 [place] sheath was placed.  We able to cross the area of high-grade stenosis in the SFA using Glidewire and quick cross catheter.  There was initially a perforation of the SFA this did seal by the completion of the procedure.  We then confirmed intraluminal access distally.  A 6 x 40 drug-eluting stent was placed and postdilated with a 5 mm balloon.  Completion angios demonstrated that there was no posterior tibial artery.  200 mcg of nitroglycerin was given and this did reappear.  There does appear to be a mid PT lesion that I did not intervene upon.  Satisfied with this I exchanged for a short 6 [place] sheath and deployed a minx device.  She did tolerate this procedure well.  Complication during the procedure was perforation of the SFA but this had sealed by completion of the procedure with recheck angiogram.  She tolerated procedure well.  Contrast: 160cc   Kemia Wendel C. Donzetta Matters, MD [facility] Office: [phone] Pager: [phone]

## 2018-10-12 ENCOUNTER — Other Ambulatory Visit: Payer: Self-pay

## 2018-10-12 DIAGNOSIS — I779 Disorder of arteries and arterioles, unspecified: Secondary | ICD-10-CM

## 2018-10-13 ENCOUNTER — Ambulatory Visit (INDEPENDENT_AMBULATORY_CARE_PROVIDER_SITE_OTHER)
Admission: RE | Admit: 2018-10-13 | Discharge: 2018-10-13 | Disposition: A | Discharge status: Home / Self Care | Payer: 59 | Source: Ambulatory Visit | Attending: Vascular Surgery | Admitting: Vascular Surgery

## 2018-10-13 ENCOUNTER — Encounter: Payer: Self-pay | Admitting: Vascular Surgery

## 2018-10-13 ENCOUNTER — Ambulatory Visit (HOSPITAL_COMMUNITY)
Admission: RE | Admit: 2018-10-13 | Discharge: 2018-10-13 | Disposition: A | Discharge status: Home / Self Care | Payer: 59 | Source: Ambulatory Visit | Attending: Vascular Surgery | Admitting: Vascular Surgery

## 2018-10-13 ENCOUNTER — Other Ambulatory Visit: Payer: Self-pay

## 2018-10-13 ENCOUNTER — Ambulatory Visit: Payer: 59 | Admitting: Vascular Surgery

## 2018-10-13 VITALS — BP 147/83 | HR 90 | Temp 97.5°F | Resp 20 | Ht 66.0 in | Wt 164.0 lb

## 2018-10-13 DIAGNOSIS — I779 Disorder of arteries and arterioles, unspecified: Secondary | ICD-10-CM

## 2018-10-13 DIAGNOSIS — Z87891 Personal history of nicotine dependence: Secondary | ICD-10-CM

## 2018-10-13 NOTE — Progress Notes (Signed)
Patient ID: Angela Alvarez, female   DOB: 1956-02-06, 63 y.o.   MRN: [id number]  Reason for Consult: Post-op Follow-up   Referred by Pearson GrippeKim, James, MD  Subjective:     HPI:  Angela Alvarez is a 63 y.o. female previous history of right great toe amputation.  Recently underwent SFA stenting on the right after she had pain at the site no palpable pulses.  Now returns with ABIs and duplex of the stent.  States that she is got scab forming on the amputation site overall feeling well no complaints today.  Past Medical History:  Diagnosis Date  . Abnormal Pap smear of cervix 06-01-13   ascus pap:pos.HR HPV/hx of cryotherapy to cx 1985  . Asthma   . COPD (chronic obstructive pulmonary disease) ([facility])   . Critical lower limb ischemia    ischemic appearing left first toe  . Peripheral vascular disease ([facility])    Family History  Problem Relation Age of Onset  . Stroke Mother        dec age 64  . Hypertension Mother   . Cancer Father        lung age 64  . Cancer Sister        dec colon ca age 64  . Hypertension Sister   . Diabetes Neg Hx   . Heart attack Neg Hx   . Hyperlipidemia Neg Hx   . Sudden death Neg Hx    Past Surgical History:  Procedure Laterality Date  . ABDOMINAL AORTOGRAM W/LOWER EXTREMITY Bilateral 09/11/2018   Procedure: ABDOMINAL AORTOGRAM W/LOWER EXTREMITY;  Surgeon: Maeola Harmanain, Mayco Walrond Christopher, MD;  [facility];  Service: Cardiovascular;  Laterality: Bilateral;  . AMPUTATION Right 05/19/2015   Procedure: RIGHT FIRST RAY AMPUTATION;  Surgeon: Sherren Kernsharles E Fields, MD;  [facility];  Service: Vascular;  Laterality: Right;  . BREAST SURGERY Right   . BUNIONECTOMY     bilateral  . CARDIAC CATHETERIZATION Bilateral    Catarct  . COLONOSCOPY W/ POLYPECTOMY    . KNEE ARTHROPLASTY Right 07/21/2017  . PERIPHERAL VASCULAR CATHETERIZATION N/A 04/28/2015   Procedure: Lower Extremity Angiography;  Surgeon: Runell GessJonathan J Berry, MD;  [facility];   Service: Cardiovascular;  Laterality: N/A;  . PERIPHERAL VASCULAR INTERVENTION Left 09/11/2018   Procedure: PERIPHERAL VASCULAR INTERVENTION;  Surgeon: Maeola Harmanain, Lidia Clavijo Christopher, MD;  [facility];  Service: Cardiovascular;  Laterality: Left;  SFA stent  . surgery under arm Right    --benign breast tissue    Short Social History:  Social History   Tobacco Use  . Smoking status: Former Smoker    Packs/day: 1.00    Types: Cigarettes    Quit date: 06/07/2015    Years since quitting: 3.3  . Smokeless tobacco: Never Used  Substance Use Topics  . Alcohol use: No    Alcohol/week: 0.0 standard drinks    Allergies  Allergen Reactions  . Amoxicillin-Pot Clavulanate Nausea And Vomiting    Did it involve swelling of the face/tongue/throat, SOB, or low BP? No Did it involve sudden or severe rash/hives, skin peeling, or any reaction on the inside of your mouth or nose? No Did you need to seek medical attention at a hospital or doctor's office? No When did it last happen?3 years If all above answers are "NO", may proceed with cephalosporin use.   . Levofloxacin Nausea And Vomiting    Current Outpatient Medications  Medication Sig Dispense Refill  . albuterol (PROVENTIL,VENTOLIN) 90 MCG/ACT inhaler  Inhale 2 puffs into the lungs every 4 (four) hours as needed for wheezing or shortness of breath.     Marland Kitchen. aspirin EC 81 MG tablet Take 81 mg by mouth every other day.     . budesonide-formoterol (SYMBICORT) 160-4.5 MCG/ACT inhaler Inhale 2 puffs into the lungs 2 (two) times daily.      . clobetasol cream (TEMOVATE) 0.05 % APPLY TOPICALLY TWICE WEEKLY (Patient taking differently: Apply 1 application topically 2 (two) times a week. ) 30 g 0  . ibuprofen (ADVIL) 200 MG tablet Take 400 mg by mouth daily as needed for headache or moderate pain.    . rosuvastatin (CRESTOR) 10 MG tablet Take 1 tablet (10 mg total) by mouth at bedtime. 30 tablet 11   No current facility-administered  medications for this visit.     Review of Systems  HENT: HENT negative.  Eyes: Eyes negative.  Respiratory: Respiratory negative.  Cardiovascular: Cardiovascular negative.  GI: Gastrointestinal negative.  Musculoskeletal: Musculoskeletal negative.  Skin:       Great toe amputation site on the right with scab Neurological: Neurological negative. Hematologic: Hematologic/lymphatic negative.  Psychiatric: Psychiatric negative.        Objective:  Objective   Vitals:   10/13/18 0957  BP: (!) 147/83  Pulse: 90  Resp: 20  Temp: (!) 97.5 F (36.4 C)  SpO2: 96%  Weight: 164 lb (74.4 kg)  Height: 5\' 6"  (1.676 m)   Body mass index is 26.47 kg/m.  Physical Exam HENT:     Head: Normocephalic.  Eyes:     Pupils: Pupils are equal, round, and reactive to light.  Cardiovascular:     Rate and Rhythm: Normal rate.     Pulses:          Radial pulses are 2+ on the right side and 2+ on the left side.       Femoral pulses are 2+ on the right side and 2+ on the left side.      Popliteal pulses are 2+ on the right side.       Dorsalis pedis pulses are 2+ on the right side.       Posterior tibial pulses are 2+ on the right side.  Pulmonary:     Effort: Pulmonary effort is normal.  Abdominal:     General: Abdomen is flat.     Palpations: Abdomen is soft. There is no mass.  Musculoskeletal: Normal range of motion.  Skin:    General: Skin is warm.     Comments: There is a small scab on the previous right great toe amputation site  Neurological:     General: No focal deficit present.     Mental Status: She is alert.  Psychiatric:        Mood and Affect: Mood normal.        Thought Content: Thought content normal.        Judgment: Judgment normal.     Data: I have independently interpreted her ABIs to be 0.91 right and biphasic her great toe is amputated.  On the left 0.87 triphasic at the dorsalis pedis with a toe pressure of 89  Also independently interpreted her right lower  extremity duplex which demonstrates stent without stenosis triphasic waveforms throughout.     Assessment/Plan:     63 year old female status post previous great toe amputation that developed pain.  Now has SFA stenting.  On aspirin and statin.  We will follow-up in 4 to 6 months with repeat  duplex and ABIs.  She has issues before she will call to follow-up sooner.  Patient cannot take Plavix due to extensive bruising but is compliant with aspirin and also taking Lipitor.     Waynetta Sandy MD [facility]

## 2018-11-20 ENCOUNTER — Encounter (HOSPITAL_COMMUNITY): Payer: 59

## 2018-11-20 ENCOUNTER — Ambulatory Visit: Payer: 59 | Admitting: Family

## 2019-01-26 ENCOUNTER — Other Ambulatory Visit: Payer: Self-pay

## 2019-01-26 ENCOUNTER — Ambulatory Visit: Payer: 59 | Admitting: Family

## 2019-01-26 ENCOUNTER — Encounter: Payer: Self-pay | Admitting: Family

## 2019-01-26 ENCOUNTER — Ambulatory Visit (HOSPITAL_COMMUNITY)
Admission: RE | Admit: 2019-01-26 | Discharge: 2019-01-26 | Disposition: A | Discharge status: Home / Self Care | Payer: 59 | Source: Ambulatory Visit | Attending: Family | Admitting: Family

## 2019-01-26 VITALS — BP 166/82 | HR 82 | Temp 98.1°F | Resp 14 | Ht 66.0 in | Wt 164.0 lb

## 2019-01-26 DIAGNOSIS — I779 Disorder of arteries and arterioles, unspecified: Secondary | ICD-10-CM

## 2019-01-26 DIAGNOSIS — I739 Peripheral vascular disease, unspecified: Secondary | ICD-10-CM

## 2019-01-26 DIAGNOSIS — Z87891 Personal history of nicotine dependence: Secondary | ICD-10-CM | POA: Diagnosis not present

## 2019-01-26 DIAGNOSIS — I75021 Atheroembolism of right lower extremity: Secondary | ICD-10-CM | POA: Diagnosis not present

## 2019-01-26 DIAGNOSIS — Z89411 Acquired absence of right great toe: Secondary | ICD-10-CM | POA: Diagnosis not present

## 2019-01-26 NOTE — Patient Instructions (Signed)
Peripheral Vascular Disease  Peripheral vascular disease (PVD) is a disease of the blood vessels that are not part of your heart and brain. A simple term for PVD is poor circulation. In most cases, PVD narrows the blood vessels that carry blood from your heart to the rest of your body. This can reduce the supply of blood to your arms, legs, and internal organs, like your stomach or kidneys. However, PVD most often affects a person's lower legs and feet. Without treatment, PVD tends to get worse. PVD can also lead to acute ischemic limb. This is when an arm or leg suddenly cannot get enough blood. This is a medical emergency. Follow these instructions at home: Lifestyle  Do not use any products that contain nicotine or tobacco, such as cigarettes and e-cigarettes. If you need help quitting, ask your doctor.  Lose weight if you are overweight. Or, stay at a healthy weight as told by your doctor.  Eat a diet that is low in fat and cholesterol. If you need help, ask your doctor.  Exercise regularly. Ask your doctor for activities that are right for you. General instructions  Take over-the-counter and prescription medicines only as told by your doctor.  Take good care of your feet: ? Wear comfortable shoes that fit well. ? Check your feet often for any cuts or sores.  Keep all follow-up visits as told by your doctor This is important. Contact a doctor if:  You have cramps in your legs when you walk.  You have leg pain when you are at rest.  You have coldness in a leg or foot.  Your skin changes.  You are unable to get or have an erection (erectile dysfunction).  You have cuts or sores on your feet that do not heal. Get help right away if:  Your arm or leg turns cold, numb, and blue.  Your arms or legs become red, warm, swollen, painful, or numb.  You have chest pain.  You have trouble breathing.  You suddenly have weakness in your face, arm, or leg.  You become very  confused or you cannot speak.  You suddenly have a very bad headache.  You suddenly cannot see. Summary  Peripheral vascular disease (PVD) is a disease of the blood vessels.  A simple term for PVD is poor circulation. Without treatment, PVD tends to get worse.  Treatment may include exercise, low fat and low cholesterol diet, and quitting smoking. This information is not intended to replace advice given to you by your health care provider. Make sure you discuss any questions you have with your health care provider. Document Released: 06/16/2009 Document Revised: 03/04/2017 Document Reviewed: 04/29/2016 Elsevier Patient Education  2020 [employer]  

## 2019-01-26 NOTE — Progress Notes (Signed)
[facility] [place]   CC: painful and blue right 4th toe x 2 weeks  History of Present Illness Angela Alvarez is a 63 y.o. 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 This was documented on CT scan and by angiogram. 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  More recently she is s/p stenting of right SFA on 09-11-18 by Dr. Donzetta Matters for right great toe ulceration.  Findings: Aorta has persistent calcific disease however is not flow-limiting.  The distal SFA has approximately 90% stenosis.  After stenting this is resolved to 0.  After stenting the dominant runoff vessel which had been the posterior tibial initially was absent but after administrating 200 mcg nitroglycerin it was visible and no intervention was undertaken.  Left side has some calcific disease in the SFA which is non-flow-limiting.  The dominant runoff is via the anterior tibial artery on the left.  Dr. Donzetta Matters last evaluated pt on 10-13-18. At that time pt was on aspirin and statin. She was to follow-up in 4 to 6 months with repeat duplex and ABIs.  If she had issues before she will call to follow-up sooner.  Patient cannot take Plavix due to extensive bruising but was compliant with aspirin and also taking Lipitor  Pt returns today with c/o right 4th toe pain that started suddenly 2 weeks ago and is worsening. The pain is worse when she is supine and in bed.   She denies any known hx of stroke or TIA. She denies any known heart problems.  Diabetic: No Tobacco ZOX:WRUEAV smoker, quitin 2017  Pt meds include: Statin :Yes, takes about 3 days/week, states she forgets to take it on the other days Betablocker:No ASA:Yes, takes about 3 days/week, states she forgets to take on the other days, started taking in February 2019 Other anticoagulants/antiplatelets:no, she stopped  Plavix taking after a month's use after right great toe amputation. She stopped due to excessive bruising. Pt states she told her PCP she stopped the Plavix.  Past Medical History:  Diagnosis Date  . Abnormal Pap smear of cervix 06-01-13   ascus pap:pos.HR HPV/hx of cryotherapy to cx 1985  . Asthma   . COPD (chronic obstructive pulmonary disease) ([place])   . Critical lower limb ischemia    ischemic appearing left first toe  . Peripheral vascular disease [facility])     Social History Social History   Tobacco Use  . Smoking status: Former Smoker    Packs/day: 1.00    Types: Cigarettes    Quit date: 06/07/2015    Years since quitting: 3.6  . Smokeless tobacco: Never Used  Substance Use Topics  . Alcohol use: No    Alcohol/week: 0.0 standard drinks  . Drug use: No    Family History Family History  Problem Relation Age of Onset  . Stroke Mother        dec age 32  . Hypertension Mother   . Cancer Father        lung age 1  . Cancer Sister        dec colon ca age 67  . Hypertension Sister   . Diabetes Neg Hx   . Heart attack Neg Hx   . Hyperlipidemia Neg Hx   . Sudden death Neg Hx     Past Surgical History:  Procedure Laterality Date  . ABDOMINAL AORTOGRAM W/LOWER EXTREMITY  Bilateral 09/11/2018   Procedure: ABDOMINAL AORTOGRAM W/LOWER EXTREMITY;  Surgeon: Maeola Harmanain, Brandon Christopher, MD;  [facility];  Service: Cardiovascular;  Laterality: Bilateral;  . AMPUTATION Right 05/19/2015   Procedure: RIGHT FIRST RAY AMPUTATION;  Surgeon: Sherren Kernsharles E Fields, MD;  [facility];  Service: Vascular;  Laterality: Right;  . BREAST SURGERY Right   . BUNIONECTOMY     bilateral  . CARDIAC CATHETERIZATION Bilateral    Catarct  . COLONOSCOPY W/ POLYPECTOMY    . KNEE ARTHROPLASTY Right 07/21/2017  . PERIPHERAL VASCULAR CATHETERIZATION N/A 04/28/2015   Procedure: Lower Extremity Angiography;  Surgeon: Runell GessJonathan J Berry, MD;  [facility];  Service: Cardiovascular;   Laterality: N/A;  . PERIPHERAL VASCULAR INTERVENTION Left 09/11/2018   Procedure: PERIPHERAL VASCULAR INTERVENTION;  Surgeon: Maeola Harmanain, Brandon Christopher, MD;  [facility];  Service: Cardiovascular;  Laterality: Left;  SFA stent  . surgery under arm Right    --benign breast tissue    Allergies  Allergen Reactions  . Amoxicillin-Pot Clavulanate Nausea And Vomiting    Did it involve swelling of the face/tongue/throat, SOB, or low BP? No Did it involve sudden or severe rash/hives, skin peeling, or any reaction on the inside of your mouth or nose? No Did you need to seek medical attention at a hospital or doctor's office? No When did it last happen?3 years If all above answers are "NO", may proceed with cephalosporin use.   . Levofloxacin Nausea And Vomiting    Current Outpatient Medications  Medication Sig Dispense Refill  . albuterol (PROVENTIL,VENTOLIN) 90 MCG/ACT inhaler Inhale 2 puffs into the lungs every 4 (four) hours as needed for wheezing or shortness of breath.     Marland Kitchen. aspirin EC 81 MG tablet Take 81 mg by mouth every other day.     . budesonide-formoterol (SYMBICORT) 160-4.5 MCG/ACT inhaler Inhale 2 puffs into the lungs 2 (two) times daily.      . clobetasol cream (TEMOVATE) 0.05 % APPLY TOPICALLY TWICE WEEKLY (Patient taking differently: Apply 1 application topically 2 (two) times a week. ) 30 g 0  . ibuprofen (ADVIL) 200 MG tablet Take 400 mg by mouth daily as needed for headache or moderate pain.    . rosuvastatin (CRESTOR) 10 MG tablet Take 1 tablet (10 mg total) by mouth at bedtime. 30 tablet 11   No current facility-administered medications for this visit.     ROS: See HPI for pertinent positives and negatives.   Physical Examination  Vitals:   01/26/19 1341  BP: (!) 166/82  Pulse: 82  Resp: 14  Temp: 98.1 F (36.7 C)  TempSrc: Oral  SpO2: 96%  Weight: 164 lb (74.4 kg)  Height: 5\' 6"  (1.676 m)   Body mass index is 26.47 kg/m.  General:  A&O x 3, WDWN, female in NAD. Gait: limp, painful right 4th toe HEE NT: No gross abnormalities.  Pulmonary: Respirations are non labored, CTAB, good air movement in all fields Cardiac: regular rhythm, no detected murmur.         Carotid Bruits Right Left   Negative Negative   Radial pulses are 2+ palpable bilaterally   Adominal aortic pulse is NOT palpable                         VASCULAR EXAM: Extremities with ischemic changes at right 4th toe tip, without Gangrene; without open wounds.   Right foot, right 4th toe tip is blue, right  4th toe is very painful to touch, well healed 1st great toe amputation site   Right foot                                                                                                           LE Pulses Right Left       FEMORAL  1+ palpable  1+ palpable        POPLITEAL  not palpable   not palpable       POSTERIOR TIBIAL  2+ palpable   not palpable        DORSALIS PEDIS      ANTERIOR TIBIAL not palpable  2+ palpable    Abdomen: soft, NT, no palpable masses. Skin: no rashes, no cellulitis, see Extremities Musculoskeletal: no muscle wasting or atrophy.  Neurologic: A&O X 3; appropriate affect, Sensation is normal; MOTOR FUNCTION:  moving all extremities equally, motor strength 5/5 throughout. Speech is fluent/normal. CN 2-12 intact. Psychiatric: Thought content is normal, mood appropriate for clinical situation.    DATA  ABI (Date: 01/26/2019): ABI Findings: +---------+------------------+-----+----------------------------+----------+ Right    Rt Pressure (mmHg)IndexWaveform                    Comment    +---------+------------------+-----+----------------------------+----------+ Brachial 168                                                           +---------+------------------+-----+----------------------------+----------+ PTA      178               1.06 biphasic                                +---------+------------------+-----+----------------------------+----------+ DP       171               1.02 biphasic                               +---------+------------------+-----+----------------------------+----------+ Great Toe                       very low amplitude from stubamputation +---------+------------------+-----+----------------------------+----------+  +---------+------------------+-----+----------+-------+ Left     Lt Pressure (mmHg)IndexWaveform  Comment +---------+------------------+-----+----------+-------+ Brachial 167                                      +---------+------------------+-----+----------+-------+ PTA      159               0.95 monophasic        +---------+------------------+-----+----------+-------+ DP       174  1.04 triphasic         +---------+------------------+-----+----------+-------+ Great Toe121               0.72                   +---------+------------------+-----+----------+-------+  +-------+-----------+-----------+------------+------------+ ABI/TBIToday's ABIToday's TBIPrevious ABIPrevious TBI +-------+-----------+-----------+------------+------------+ Right  1.06       amputation 0.96        amputation   +-------+-----------+-----------+------------+------------+ Left   1.04       0.72       0.89        0.56         +-------+-----------+-----------+------------+------------+ Previous ABI on 10/13/18. Pressures may be falsely elevated   Summary: Right: Resting right ankle-brachial index is within normal range.   Left: Resting left ankle-brachial index is within normal range. The left toe-brachial index is normal. LT Great toe pressure = 121 mmHg.   ASSESSMENT: Angela Alvarez is a 63 y.o. 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 This was thought to be secondary to atheroembolization from an exophytic calcified aortic  plaque. This was documented on CT scan and by angiogram. 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  More recently she is s/p stenting of right SFA on 09-11-18 by Dr. Randie Heinz for right great toe ulceration.  She returns today with c/o painful and blue right 4th toe for 2 weeks, the pain is worsening.    Since she had excessive bruising with the use of Plavix, does not need to continue taking Plavix (discussed with Dr. Darrick Penna at a previous visit).  Continue taking 81 mg ASA daily and a statin. However, pt has not been taking the statin and ASA daily, states she forgets, and actually takes both about 3x/week. I advised her to take both daily.   ABI's today show normal arterial perfusion bilaterally, tri and monophasic waveforms in the right, biphasic in the left.  Her right PT pulse is 2+ palpable, left DP pulse is 2+ palpable. Bilateral femoral pulses are 1+ palpable.   Her knee pain  And now right 4th toe pain limits her walking, right heel callus is also painful when pressure applied    PLAN:  Based on the patient's vascular studies and examination, and after discussing with Dr. Chestine Spore, pt will obtain right LE artrial duplex ASAP and see Dr. Randie Heinz (NOT NP) afterward ASAP.   I discussed in depth with the patient the nature of atherosclerosis, and emphasized the importance of maximal medical management including strict control of blood pressure, blood glucose, and lipid levels, obtaining regular exercise, and continued cessation of smoking.  The patient is aware that without maximal medical management the underlying atherosclerotic disease process will progress, limiting the benefit of any interventions.  The patient was given information about PAD including signs, symptoms, treatment, what symptoms should prompt the patient to seek immediate medical care, and risk reduction measures to take.  Charisse March, RN, MSN, FNP-C [facility] [place] Phone:  [phone]  Clinic MD: Chestine Spore on call  01/26/19 2:12 PM

## 2019-01-29 ENCOUNTER — Other Ambulatory Visit: Payer: Self-pay

## 2019-01-29 DIAGNOSIS — I779 Disorder of arteries and arterioles, unspecified: Secondary | ICD-10-CM

## 2019-02-01 ENCOUNTER — Telehealth (HOSPITAL_COMMUNITY): Payer: Self-pay

## 2019-02-01 NOTE — Telephone Encounter (Signed)

## 2019-02-02 ENCOUNTER — Other Ambulatory Visit: Payer: Self-pay

## 2019-02-02 ENCOUNTER — Ambulatory Visit (INDEPENDENT_AMBULATORY_CARE_PROVIDER_SITE_OTHER): Payer: 59 | Admitting: Vascular Surgery

## 2019-02-02 ENCOUNTER — Ambulatory Visit (HOSPITAL_COMMUNITY)
Admission: RE | Admit: 2019-02-02 | Discharge: 2019-02-02 | Disposition: A | Discharge status: Home / Self Care | Payer: 59 | Source: Ambulatory Visit | Attending: Vascular Surgery | Admitting: Vascular Surgery

## 2019-02-02 ENCOUNTER — Encounter: Payer: Self-pay | Admitting: Vascular Surgery

## 2019-02-02 VITALS — BP 139/89 | HR 85 | Temp 97.4°F | Resp 20 | Ht 66.0 in | Wt 174.0 lb

## 2019-02-02 DIAGNOSIS — I779 Disorder of arteries and arterioles, unspecified: Secondary | ICD-10-CM | POA: Diagnosis present

## 2019-02-02 DIAGNOSIS — Z87891 Personal history of nicotine dependence: Secondary | ICD-10-CM

## 2019-02-02 DIAGNOSIS — I75021 Atheroembolism of right lower extremity: Secondary | ICD-10-CM

## 2019-02-02 NOTE — Progress Notes (Signed)
Patient ID: Angela Alvarez, female   DOB: Nov 30, 1955, 63 y.o.   MRN: [id number]  Reason for Consult: Follow-up   Referred by Jani Gravel, MD  Subjective:     HPI:  Angela Alvarez is a 63 y.o. 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  Most recent underwent stenting by myself this summer of her SFA.  She now has blue discoloration of her fourth toe which is quite painful.  Last visit she did have a palpable pulse.  She has not been taking her aspirin and Lipitor as described.  She is a former smoker having quit in 2017 during her first instance.  She does not have any tissue loss or ulceration does not take any blood thinners.  Past Medical History:  Diagnosis Date  . Abnormal Pap smear of cervix 06-01-13   ascus pap:pos.HR HPV/hx of cryotherapy to cx 1985  . Asthma   . COPD (chronic obstructive pulmonary disease) ([place])   . Critical lower limb ischemia    ischemic appearing left first toe  . Peripheral vascular disease ([place])    Family History  Problem Relation Age of Onset  . Stroke Mother        dec age 54  . Hypertension Mother   . Cancer Father        lung age 18  . Cancer Sister        dec colon ca age 52  . Hypertension Sister   . Diabetes Neg Hx   . Heart attack Neg Hx   . Hyperlipidemia Neg Hx   . Sudden death Neg Hx    Past Surgical History:  Procedure Laterality Date  . ABDOMINAL AORTOGRAM W/LOWER EXTREMITY Bilateral 09/11/2018   Procedure: ABDOMINAL AORTOGRAM W/LOWER EXTREMITY;  Surgeon: Waynetta Sandy, MD;  Location: [facility];  Service: Cardiovascular;  Laterality: Bilateral;  . AMPUTATION Right 05/19/2015   Procedure: RIGHT FIRST RAY AMPUTATION;  Surgeon: Elam Dutch, MD;  Location: [place];  Service: Vascular;  Laterality: Right;  . BREAST SURGERY Right   . BUNIONECTOMY     bilateral  . CARDIAC CATHETERIZATION Bilateral    Catarct   . COLONOSCOPY W/ POLYPECTOMY    . KNEE ARTHROPLASTY Right 07/21/2017  . PERIPHERAL VASCULAR CATHETERIZATION N/A 04/28/2015   Procedure: Lower Extremity Angiography;  Surgeon: Lorretta Harp, MD;  Location: [place] [facility] LAB;  Service: Cardiovascular;  Laterality: N/A;  . PERIPHERAL VASCULAR INTERVENTION Left 09/11/2018   Procedure: PERIPHERAL VASCULAR INTERVENTION;  Surgeon: Waynetta Sandy, MD;  Location: [place] [facility] LAB;  Service: Cardiovascular;  Laterality: Left;  SFA stent  . surgery under arm Right    --benign breast tissue    Short Social History:  Social History   Tobacco Use  . Smoking status: Former Smoker    Packs/day: 1.00    Types: Cigarettes    Quit date: 06/07/2015    Years since quitting: 3.6  . Smokeless tobacco: Never Used  Substance Use Topics  . Alcohol use: No    Alcohol/week: 0.0 standard drinks    Allergies  Allergen Reactions  . Amoxicillin-Pot Clavulanate Nausea And Vomiting    Did it involve swelling of the face/tongue/throat, SOB, or low BP? No Did it involve sudden or severe rash/hives, skin peeling, or any reaction on the inside of your mouth or nose? No Did you need to seek medical attention at a hospital or doctor's office?  No When did it last happen?3 years If all above answers are "NO", may proceed with cephalosporin use.   . Levofloxacin Nausea And Vomiting    Current Outpatient Medications  Medication Sig Dispense Refill  . albuterol (PROVENTIL,VENTOLIN) 90 MCG/ACT inhaler Inhale 2 puffs into the lungs every 4 (four) hours as needed for wheezing or shortness of breath.     Marland Kitchen aspirin EC 81 MG tablet Take 81 mg by mouth every other day.     . budesonide-formoterol (SYMBICORT) 160-4.5 MCG/ACT inhaler Inhale 2 puffs into the lungs 2 (two) times daily.      . clobetasol cream (TEMOVATE) 0.05 % APPLY TOPICALLY TWICE WEEKLY (Patient taking differently: Apply 1 application topically 2 (two) times a week. ) 30 g 0  . ibuprofen  (ADVIL) 200 MG tablet Take 400 mg by mouth daily as needed for headache or moderate pain.    . rosuvastatin (CRESTOR) 10 MG tablet Take 1 tablet (10 mg total) by mouth at bedtime. 30 tablet 11   No current facility-administered medications for this visit.     Review of Systems  Constitutional:  Constitutional negative. HENT: HENT negative.  Eyes: Eyes negative.  Cardiovascular: Cardiovascular negative.  GI: Gastrointestinal negative.  Musculoskeletal:       Toe pain and discoloration on the right Neurological: Neurological negative. Hematologic: Hematologic/lymphatic negative.  Psychiatric: Psychiatric negative.        Objective:  Objective   Vitals:   02/02/19 0843  BP: 139/89  Pulse: 85  Resp: 20  Temp: (!) 97.4 F (36.3 C)  SpO2: 95%  Weight: 174 lb (78.9 kg)  Height: 5\' 6"  (1.676 m)   Body mass index is 28.08 kg/m.  Physical Exam HENT:     Head: Normocephalic.     Nose: Nose normal.  Eyes:     Pupils: Pupils are equal, round, and reactive to light.  Cardiovascular:     Pulses:          Femoral pulses are 2+ on the right side and 2+ on the left side.      Dorsalis pedis pulses are 2+ on the right side and 2+ on the left side.  Abdominal:     General: Abdomen is flat.     Palpations: Abdomen is soft.  Musculoskeletal:     Comments: There is purple discoloration of the right fourth toe  Skin:    General: Skin is warm.     Capillary Refill: Capillary refill takes less than 2 seconds.  Neurological:     General: No focal deficit present.     Mental Status: She is alert.  Psychiatric:        Mood and Affect: Mood normal.        Thought Content: Thought content normal.        Judgment: Judgment normal.     Data: I have independently interpreted her right lower extremity duplex which demonstrates patent stent and triphasic waveforms distally.     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  Right fourth toe has now what appears to be purple discoloration and significant pain.  She does have a palpable pulse.  We will get a CT scan given the duplexes normal.  She does need to take aspirin and statin as she has not been compliant with her meds.     77 MD [facility]

## 2019-02-05 ENCOUNTER — Other Ambulatory Visit: Payer: Self-pay | Admitting: Vascular Surgery

## 2019-02-05 DIAGNOSIS — R0989 Other specified symptoms and signs involving the circulatory and respiratory systems: Secondary | ICD-10-CM

## 2019-02-19 ENCOUNTER — Other Ambulatory Visit: Payer: 59

## 2019-02-21 ENCOUNTER — Other Ambulatory Visit: Payer: Self-pay

## 2019-02-21 DIAGNOSIS — I743 Embolism and thrombosis of arteries of the lower extremities: Secondary | ICD-10-CM

## 2019-02-23 ENCOUNTER — Encounter: Payer: Self-pay | Admitting: Vascular Surgery

## 2019-02-23 ENCOUNTER — Ambulatory Visit
Admission: RE | Admit: 2019-02-23 | Discharge: 2019-02-23 | Disposition: A | Discharge status: Home / Self Care | Payer: 59 | Source: Ambulatory Visit | Attending: Vascular Surgery | Admitting: Vascular Surgery

## 2019-02-23 ENCOUNTER — Ambulatory Visit (INDEPENDENT_AMBULATORY_CARE_PROVIDER_SITE_OTHER): Payer: 59 | Admitting: Vascular Surgery

## 2019-02-23 ENCOUNTER — Other Ambulatory Visit: Payer: Self-pay

## 2019-02-23 VITALS — BP 132/74 | HR 100 | Temp 97.6°F | Resp 20 | Ht 66.0 in | Wt 169.0 lb

## 2019-02-23 DIAGNOSIS — I75021 Atheroembolism of right lower extremity: Secondary | ICD-10-CM | POA: Diagnosis not present

## 2019-02-23 DIAGNOSIS — I743 Embolism and thrombosis of arteries of the lower extremities: Secondary | ICD-10-CM

## 2019-02-23 MED ORDER — IOPAMIDOL (ISOVUE-370) INJECTION 76%
100.0000 mL | Freq: Once | INTRAVENOUS | Status: AC | PRN
  Administered 2019-02-23: 100 mL via INTRAVENOUS

## 2019-02-23 NOTE — Progress Notes (Signed)
Patient ID: Angela Alvarez, female   DOB: 01-15-1956, 63 y.o.   MRN: [id number]  Reason for Consult: Follow-up   Referred by Jani Gravel, MD  Subjective:     HPI:  Angela Alvarez is a 63 y.o. female has previously undergone a right great toe amputation secondary to atheroembolization.  She also has stenting of her right SFA with covered stent.  She presented to me a few weeks back with blue discoloration of her fourth toe.  At that time patient was not taking any aspirin or statin.  She is a former smoker quit 4 years ago.  Since last visit she has began taking aspirin she is also on Lipitor now.  States that occasionally her toe hurts at night but is getting better during the day.  She does not have any ulceration.  The toe remains purple discolored.  Past Medical History:  Diagnosis Date  . Abnormal Pap smear of cervix 06-01-13   ascus pap:pos.HR HPV/hx of cryotherapy to cx 1985  . Asthma   . COPD (chronic obstructive pulmonary disease) ([place])   . Critical lower limb ischemia    ischemic appearing left first toe  . Peripheral vascular disease ([place])    Family History  Problem Relation Age of Onset  . Stroke Mother        dec age 52  . Hypertension Mother   . Cancer Father        lung age 20  . Cancer Sister        dec colon ca age 3  . Hypertension Sister   . Diabetes Neg Hx   . Heart attack Neg Hx   . Hyperlipidemia Neg Hx   . Sudden death Neg Hx    Past Surgical History:  Procedure Laterality Date  . ABDOMINAL AORTOGRAM W/LOWER EXTREMITY Bilateral 09/11/2018   Procedure: ABDOMINAL AORTOGRAM W/LOWER EXTREMITY;  Surgeon: Waynetta Sandy, MD;  Location: [facility];  Service: Cardiovascular;  Laterality: Bilateral;  . AMPUTATION Right 05/19/2015   Procedure: RIGHT FIRST RAY AMPUTATION;  Surgeon: Elam Dutch, MD;  Location: [facility];  Service: Vascular;  Laterality: Right;  . BREAST SURGERY Right   . BUNIONECTOMY     bilateral  . CARDIAC  CATHETERIZATION Bilateral    Catarct  . COLONOSCOPY W/ POLYPECTOMY    . KNEE ARTHROPLASTY Right 07/21/2017  . PERIPHERAL VASCULAR CATHETERIZATION N/A 04/28/2015   Procedure: Lower Extremity Angiography;  Surgeon: Lorretta Harp, MD;  Location: [facility];  Service: Cardiovascular;  Laterality: N/A;  . PERIPHERAL VASCULAR INTERVENTION Left 09/11/2018   Procedure: PERIPHERAL VASCULAR INTERVENTION;  Surgeon: Waynetta Sandy, MD;  Location: [facility];  Service: Cardiovascular;  Laterality: Left;  SFA stent  . surgery under arm Right    --benign breast tissue    Short Social History:  Social History   Tobacco Use  . Smoking status: Former Smoker    Packs/day: 1.00    Types: Cigarettes    Quit date: 06/07/2015    Years since quitting: 3.7  . Smokeless tobacco: Never Used  Substance Use Topics  . Alcohol use: No    Alcohol/week: 0.0 standard drinks    Allergies  Allergen Reactions  . Amoxicillin-Pot Clavulanate Nausea And Vomiting    Did it involve swelling of the face/tongue/throat, SOB, or low BP? No Did it involve sudden or severe rash/hives, skin peeling, or any reaction on the inside of your mouth or nose? No Did you need to seek  medical attention at a hospital or doctor's office? No When did it last happen?3 years If all above answers are "NO", may proceed with cephalosporin use.   . Levofloxacin Nausea And Vomiting    Current Outpatient Medications  Medication Sig Dispense Refill  . albuterol (PROVENTIL,VENTOLIN) 90 MCG/ACT inhaler Inhale 2 puffs into the lungs every 4 (four) hours as needed for wheezing or shortness of breath.     Marland Kitchen. aspirin EC 81 MG tablet Take 81 mg by mouth every other day.     Marland Kitchen. atorvastatin (LIPITOR) 40 MG tablet     . budesonide-formoterol (SYMBICORT) 160-4.5 MCG/ACT inhaler Inhale 2 puffs into the lungs 2 (two) times daily.      . clobetasol cream (TEMOVATE) 0.05 % APPLY TOPICALLY TWICE WEEKLY (Patient taking  differently: Apply 1 application topically 2 (two) times a week. ) 30 g 0  . ibuprofen (ADVIL) 200 MG tablet Take 400 mg by mouth daily as needed for headache or moderate pain.     No current facility-administered medications for this visit.     Review of Systems  Constitutional:  Constitutional negative. HENT: HENT negative.  Eyes: Eyes negative.  Cardiovascular: Cardiovascular negative.  GI: Gastrointestinal negative.  Musculoskeletal:       Toe pain Skin: Skin negative.  Neurological: Neurological negative. Hematologic: Hematologic/lymphatic negative.  Psychiatric: Psychiatric negative.        Objective:  Objective   Vitals:   02/23/19 1105  BP: 132/74  Pulse: 100  Resp: 20  Temp: 97.6 F (36.4 C)  SpO2: 98%  Weight: 169 lb (76.7 kg)  Height: 5\' 6"  (1.676 m)   Body mass index is 27.28 kg/m.  Physical Exam HENT:     Head: Normocephalic.     Mouth/Throat:     Mouth: Mucous membranes are moist.  Eyes:     Pupils: Pupils are equal, round, and reactive to light.  Cardiovascular:     Rate and Rhythm: Normal rate.     Pulses:          Radial pulses are 2+ on the right side and 2+ on the left side.       Popliteal pulses are 2+ on the right side and 2+ on the left side.       Dorsalis pedis pulses are 2+ on the right side.  Pulmonary:     Effort: Pulmonary effort is normal.  Abdominal:     General: Abdomen is flat.  Musculoskeletal: Normal range of motion.        General: No swelling.     Comments: Purple discoloration of right fourth toe  Skin:    General: Skin is warm and dry.     Capillary Refill: Capillary refill takes less than 2 seconds.  Neurological:     General: No focal deficit present.     Mental Status: She is alert.  Psychiatric:        Mood and Affect: Mood normal.        Behavior: Behavior normal.        Thought Content: Thought content normal.        Judgment: Judgment normal.     Data: I reviewed the patient's CT scan with her prior  to the read returning as below.  There are no new areas of atherosclerosis although she does have significant disease within her aorta and iliac segments.  Stent is widely patent in the right SFA.  Appears to be three-vessel runoff bilaterally.   CT angio IMPRESSION: VASCULAR  1. Moderate amount of atherosclerotic plaque within a normal caliber abdominal aorta, not resulting in a hemodynamically significant stenosis. Aortic Atherosclerosis (ICD10-I70.0).  Right lower extremity vascular impression:  1. Post stenting of the right SFA/above knee popliteal artery without evidence of in stent stenosis. 2. Three-vessel runoff to the right lower leg though predominant arterial supply to the right foot is via the right posterior tibial artery as the right anterior tibial artery is patent though atretic distally. An atretic right-sided dorsalis pedis artery is patent to the level of the midfoot. No discrete intraluminal filling defects to suggest distal embolism.     Assessment/Plan:    66 old female with vascular disease as described above.  Stent is patent.  There is palpable pedal pulses bilaterally.  She now is taking aspirin and a statin.  Patient only option for pain control of the right fourth toe would be amputation at this time she does not want to entertain this.  If she changes her mind she can call to schedule this.  She is going to see podiatry.  She should be clear for any intervention from a vascular standpoint on her feet.  She will follow-up in 6 months with repeat right lower extremity duplex and ABIs.     Maeola Harman MD [facility]

## 2019-02-26 ENCOUNTER — Other Ambulatory Visit: Payer: Self-pay

## 2019-02-26 ENCOUNTER — Encounter (HOSPITAL_COMMUNITY): Payer: Self-pay | Admitting: *Deleted

## 2019-02-26 NOTE — Progress Notes (Signed)
Ms Hittle denies chest pain or shortness of breath.  Patient will be tested for Covid 02/27/2019.  I instructed patient to stop Ibuprofen.

## 2019-02-27 ENCOUNTER — Other Ambulatory Visit (HOSPITAL_COMMUNITY)
Admission: RE | Admit: 2019-02-27 | Discharge: 2019-02-27 | Disposition: A | Discharge status: Home / Self Care | Payer: 59 | Source: Ambulatory Visit | Attending: Vascular Surgery | Admitting: Vascular Surgery

## 2019-02-27 ENCOUNTER — Other Ambulatory Visit: Payer: Self-pay | Admitting: *Deleted

## 2019-02-27 DIAGNOSIS — Z20828 Contact with and (suspected) exposure to other viral communicable diseases: Secondary | ICD-10-CM | POA: Insufficient documentation

## 2019-02-27 DIAGNOSIS — Z01812 Encounter for preprocedural laboratory examination: Secondary | ICD-10-CM | POA: Insufficient documentation

## 2019-02-27 LAB — SARS CORONAVIRUS 2 (TAT 6-24 HRS): SARS Coronavirus 2: NEGATIVE

## 2019-02-27 NOTE — Progress Notes (Signed)
I called Ms Angela Alvarez and reminded her that she needs to go have Covid test today, before 3 pm.

## 2019-02-28 ENCOUNTER — Ambulatory Visit (HOSPITAL_COMMUNITY): Payer: 59 | Admitting: Certified Registered Nurse Anesthetist

## 2019-02-28 ENCOUNTER — Ambulatory Visit (HOSPITAL_COMMUNITY)
Admission: RE | Admit: 2019-02-28 | Discharge: 2019-02-28 | Disposition: A | Discharge status: Home / Self Care | Payer: 59 | Attending: Vascular Surgery | Admitting: Vascular Surgery

## 2019-02-28 ENCOUNTER — Encounter (HOSPITAL_COMMUNITY): Payer: Self-pay

## 2019-02-28 ENCOUNTER — Encounter (HOSPITAL_COMMUNITY)
Admission: RE | Disposition: A | Discharge status: Home / Self Care | Payer: Self-pay | Source: Home / Self Care | Attending: Vascular Surgery

## 2019-02-28 ENCOUNTER — Other Ambulatory Visit: Payer: Self-pay

## 2019-02-28 DIAGNOSIS — Z880 Allergy status to penicillin: Secondary | ICD-10-CM | POA: Diagnosis not present

## 2019-02-28 DIAGNOSIS — Z7982 Long term (current) use of aspirin: Secondary | ICD-10-CM | POA: Insufficient documentation

## 2019-02-28 DIAGNOSIS — Z79899 Other long term (current) drug therapy: Secondary | ICD-10-CM | POA: Insufficient documentation

## 2019-02-28 DIAGNOSIS — Z89411 Acquired absence of right great toe: Secondary | ICD-10-CM | POA: Insufficient documentation

## 2019-02-28 DIAGNOSIS — Z95828 Presence of other vascular implants and grafts: Secondary | ICD-10-CM | POA: Diagnosis not present

## 2019-02-28 DIAGNOSIS — Z7951 Long term (current) use of inhaled steroids: Secondary | ICD-10-CM | POA: Diagnosis not present

## 2019-02-28 DIAGNOSIS — J449 Chronic obstructive pulmonary disease, unspecified: Secondary | ICD-10-CM | POA: Diagnosis not present

## 2019-02-28 DIAGNOSIS — I96 Gangrene, not elsewhere classified: Secondary | ICD-10-CM

## 2019-02-28 DIAGNOSIS — Z87891 Personal history of nicotine dependence: Secondary | ICD-10-CM | POA: Insufficient documentation

## 2019-02-28 DIAGNOSIS — M1990 Unspecified osteoarthritis, unspecified site: Secondary | ICD-10-CM | POA: Insufficient documentation

## 2019-02-28 DIAGNOSIS — Z881 Allergy status to other antibiotic agents status: Secondary | ICD-10-CM | POA: Diagnosis not present

## 2019-02-28 HISTORY — DX: Unspecified osteoarthritis, unspecified site: M19.90

## 2019-02-28 HISTORY — PX: AMPUTATION: SHX166

## 2019-02-28 HISTORY — DX: Headache, unspecified: R51.9

## 2019-02-28 LAB — BASIC METABOLIC PANEL
Anion gap: 10 (ref 5–15)
BUN: 8 mg/dL (ref 8–23)
CO2: 23 mmol/L (ref 22–32)
Calcium: 8.9 mg/dL (ref 8.9–10.3)
Chloride: 104 mmol/L (ref 98–111)
Creatinine, Ser: 0.75 mg/dL (ref 0.44–1.00)
GFR calc Af Amer: >60 mL/min (ref >60)
GFR calc non Af Amer: >60 mL/min (ref >60)
Glucose, Bld: 96 mg/dL (ref 70–99)
Potassium: 3.9 mmol/L (ref 3.5–5.1)
Sodium: 137 mmol/L (ref 135–145)

## 2019-02-28 LAB — CBC
HCT: 41.9 % (ref 36.0–46.0)
Hemoglobin: 13.3 g/dL (ref 12.0–15.0)
MCH: 27.7 pg (ref 26.0–34.0)
MCHC: 31.7 g/dL (ref 30.0–36.0)
MCV: 87.1 fL (ref 80.0–100.0)
Platelets: 393 10*3/uL (ref 150–400)
RBC: 4.81 MIL/uL (ref 3.87–5.11)
RDW: 12.9 % (ref 11.5–15.5)
WBC: 6.4 10*3/uL (ref 4.0–10.5)
nRBC: 0 % (ref 0.0–0.2)

## 2019-02-28 SURGERY — AMPUTATION DIGIT
Anesthesia: General | Site: Foot | Laterality: Right

## 2019-02-28 MED ORDER — 0.9 % SODIUM CHLORIDE (POUR BTL) OPTIME
TOPICAL | Status: DC | PRN
  Administered 2019-02-28: 1000 mL

## 2019-02-28 MED ORDER — DEXAMETHASONE SODIUM PHOSPHATE 10 MG/ML IJ SOLN
INTRAMUSCULAR | Status: AC
  Filled 2019-02-28: qty 1

## 2019-02-28 MED ORDER — FENTANYL CITRATE (PF) 100 MCG/2ML IJ SOLN: 25.0000 ug | INTRAMUSCULAR | Status: DC | PRN

## 2019-02-28 MED ORDER — SODIUM CHLORIDE 0.9 % IV SOLN: INTRAVENOUS | Status: DC

## 2019-02-28 MED ORDER — PROPOFOL 10 MG/ML IV BOLUS
INTRAVENOUS | Status: AC
  Filled 2019-02-28: qty 20

## 2019-02-28 MED ORDER — VANCOMYCIN HCL IN DEXTROSE 1-5 GM/200ML-% IV SOLN
INTRAVENOUS | Status: AC
  Administered 2019-02-28: 1000 mg via INTRAVENOUS
  Filled 2019-02-28: qty 200

## 2019-02-28 MED ORDER — VANCOMYCIN HCL IN DEXTROSE 1-5 GM/200ML-% IV SOLN
1000.0000 mg | INTRAVENOUS | Status: AC
  Administered 2019-02-28: 10:00:00 1000 mg via INTRAVENOUS

## 2019-02-28 MED ORDER — LIDOCAINE 2% (20 MG/ML) 5 ML SYRINGE
INTRAMUSCULAR | Status: DC | PRN
  Administered 2019-02-28: 80 mg via INTRAVENOUS

## 2019-02-28 MED ORDER — FENTANYL CITRATE (PF) 100 MCG/2ML IJ SOLN
INTRAMUSCULAR | Status: DC | PRN
  Administered 2019-02-28 (×2): 50 ug via INTRAVENOUS

## 2019-02-28 MED ORDER — PROPOFOL 10 MG/ML IV BOLUS
INTRAVENOUS | Status: DC | PRN
  Administered 2019-02-28: 200 mg via INTRAVENOUS

## 2019-02-28 MED ORDER — LIDOCAINE 2% (20 MG/ML) 5 ML SYRINGE
INTRAMUSCULAR | Status: AC
  Filled 2019-02-28: qty 5

## 2019-02-28 MED ORDER — LIDOCAINE HCL (PF) 1 % IJ SOLN
INTRAMUSCULAR | Status: DC | PRN
  Administered 2019-02-28: 8 mL via INTRADERMAL

## 2019-02-28 MED ORDER — ONDANSETRON HCL 4 MG/2ML IJ SOLN
INTRAMUSCULAR | Status: AC
  Filled 2019-02-28: qty 2

## 2019-02-28 MED ORDER — ONDANSETRON HCL 4 MG PO TABS: 4.0000 mg | ORAL_TABLET | Freq: Three times a day (TID) | ORAL | 0 refills | Status: DC | PRN

## 2019-02-28 MED ORDER — HYDROCODONE-ACETAMINOPHEN 5-325 MG PO TABS: 1.0000 | ORAL_TABLET | Freq: Four times a day (QID) | ORAL | 0 refills | Status: DC | PRN

## 2019-02-28 MED ORDER — FENTANYL CITRATE (PF) 250 MCG/5ML IJ SOLN
INTRAMUSCULAR | Status: AC
  Filled 2019-02-28: qty 5

## 2019-02-28 MED ORDER — SUCCINYLCHOLINE CHLORIDE 200 MG/10ML IV SOSY
PREFILLED_SYRINGE | INTRAVENOUS | Status: AC
  Filled 2019-02-28: qty 10

## 2019-02-28 MED ORDER — DEXAMETHASONE SODIUM PHOSPHATE 10 MG/ML IJ SOLN
INTRAMUSCULAR | Status: DC | PRN
  Administered 2019-02-28: 4 mg via INTRAVENOUS

## 2019-02-28 MED ORDER — LIDOCAINE HCL (PF) 1 % IJ SOLN
INTRAMUSCULAR | Status: AC
  Filled 2019-02-28: qty 30

## 2019-02-28 MED ORDER — LACTATED RINGERS IV SOLN
INTRAVENOUS | Status: DC
  Administered 2019-02-28: 10:00:00 via INTRAVENOUS

## 2019-02-28 MED ORDER — ONDANSETRON HCL 4 MG/2ML IJ SOLN
INTRAMUSCULAR | Status: DC | PRN
  Administered 2019-02-28: 4 mg via INTRAVENOUS

## 2019-02-28 MED ORDER — MIDAZOLAM HCL 2 MG/2ML IJ SOLN
INTRAMUSCULAR | Status: AC
  Filled 2019-02-28: qty 2

## 2019-02-28 SURGICAL SUPPLY — 29 items
BLADE AVERAGE 25MMX9MM (BLADE)
BLADE AVERAGE 25X9 (BLADE) IMPLANT
BLADE SAW SGTL 81X20 HD (BLADE) IMPLANT
BNDG ELASTIC 4X5.8 VLCR STR LF (GAUZE/BANDAGES/DRESSINGS) ×3 IMPLANT
BNDG GAUZE ELAST 4 BULKY (GAUZE/BANDAGES/DRESSINGS) ×3 IMPLANT
CANISTER SUCT 3000ML PPV (MISCELLANEOUS) ×3 IMPLANT
COVER SURGICAL LIGHT HANDLE (MISCELLANEOUS) ×3 IMPLANT
COVER WAND RF STERILE (DRAPES) IMPLANT
DRAPE EXTREMITY T 121X128X90 (DISPOSABLE) ×3 IMPLANT
DRAPE HALF SHEET 40X57 (DRAPES) ×3 IMPLANT
ELECT REM PT RETURN 9FT ADLT (ELECTROSURGICAL) ×3
ELECTRODE REM PT RTRN 9FT ADLT (ELECTROSURGICAL) ×1 IMPLANT
GAUZE SPONGE 4X4 12PLY STRL (GAUZE/BANDAGES/DRESSINGS) ×3 IMPLANT
GLOVE BIO SURGEON STRL SZ7.5 (GLOVE) ×3 IMPLANT
GOWN STRL REUS W/ TWL LRG LVL3 (GOWN DISPOSABLE) ×2 IMPLANT
GOWN STRL REUS W/ TWL XL LVL3 (GOWN DISPOSABLE) ×1 IMPLANT
GOWN STRL REUS W/TWL LRG LVL3 (GOWN DISPOSABLE) ×4
GOWN STRL REUS W/TWL XL LVL3 (GOWN DISPOSABLE) ×2
KIT BASIN OR (CUSTOM PROCEDURE TRAY) ×3 IMPLANT
KIT TURNOVER KIT B (KITS) ×3 IMPLANT
NEEDLE HYPO 25GX1X1/2 BEV (NEEDLE) ×3 IMPLANT
NS IRRIG 1000ML POUR BTL (IV SOLUTION) ×3 IMPLANT
PACK GENERAL/GYN (CUSTOM PROCEDURE TRAY) ×3 IMPLANT
PAD ARMBOARD 7.5X6 YLW CONV (MISCELLANEOUS) ×6 IMPLANT
SUT ETHILON 3 0 PS 1 (SUTURE) ×3 IMPLANT
SYR CONTROL 10ML LL (SYRINGE) ×3 IMPLANT
TOWEL GREEN STERILE (TOWEL DISPOSABLE) ×6 IMPLANT
UNDERPAD 30X30 (UNDERPADS AND DIAPERS) ×3 IMPLANT
WATER STERILE IRR 1000ML POUR (IV SOLUTION) ×3 IMPLANT

## 2019-02-28 NOTE — Discharge Instructions (Signed)
Incision and Drainage, Care After  This sheet gives you information about how to care for yourself after your procedure. Your health care provider may also give you more specific instructions. If you have problems or questions, contact your health care provider.  What can I expect after the procedure?  After the procedure, it is common to have:  · Pain or discomfort around the incision site.  · Blood, fluid, or pus (drainage) from the incision.  · Redness and firm skin around the incision site.  Follow these instructions at home:  Medicines  · Take over-the-counter and prescription medicines only as told by your health care provider.  · If you were prescribed an antibiotic medicine, use or take it as told by your health care provider. Do not stop using the antibiotic even if you start to feel better.  Wound care    Follow instructions from your health care provider about how to take care of your wound. Make sure you:  · Wash your hands with soap and water before and after you change your bandage (dressing). If soap and water are not available, use hand sanitizer.  · Change your dressing and packing as told by your health care provider.  ? If your dressing is dry or stuck when you try to remove it, moisten or wet the dressing with saline or water so that it can be removed without harming your skin or tissues.  ? If your wound is packed, leave it in place until your health care provider tells you to remove it. To remove the packing, moisten or wet the packing with saline or water so that it can be removed without harming your skin or tissues.  · Leave stitches (sutures), skin glue, or adhesive strips in place. These skin closures may need to stay in place for 2 weeks or longer. If adhesive strip edges start to loosen and curl up, you may trim the loose edges. Do not remove adhesive strips completely unless your health care provider tells you to do that.  Check your wound every day for signs of infection. Check  for:  · More redness, swelling, or pain.  · More fluid or blood.  · Warmth.  · Pus or a bad smell.  If you were sent home with a drain tube in place, follow instructions from your health care provider about:  · How to empty it.  · How to care for it at home.     General instructions  · Rest the affected area.  · Do not take baths, swim, or use a hot tub until your health care provider approves. Ask your health care provider if you may take showers. You may only be allowed to take sponge baths.  · Return to your normal activities as told by your health care provider. Ask your health care provider what activities are safe for you. Your health care provider may put you on activity or lifting restrictions.  · The incision will continue to drain. It is normal to have some clear or slightly bloody drainage. The amount of drainage should lessen each day.  · Do not apply any creams, ointments, or liquids unless you have been told to by your health care provider.  · Keep all follow-up visits as told by your health care provider. This is important.  Contact a health care provider if:  · Your cyst or abscess returns.  · You have a fever or chills.  · You have more redness, swelling, or pain   around your incision.  · You have more fluid or blood coming from your incision.  · Your incision feels warm to the touch.  · You have pus or a bad smell coming from your incision.  · You have red streaks above or below the incision site.  Get help right away if:  · You have severe pain or bleeding.  · You cannot eat or drink without vomiting.  · You have decreased urine output.  · You become short of breath.  · You have chest pain.  · You cough up blood.  · The affected area becomes numb or starts to tingle.  These symptoms may represent a serious problem that is an emergency. Do not wait to see if the symptoms will go away. Get medical help right away. Call your local emergency services (911 in the [place] Do not drive yourself to the  hospital.  Summary  · After this procedure, it is common to have fluid, blood, or pus coming from the surgery site.  · Follow all home care instructions. You will be told how to take care of your incision, how to check for infection, and how to take medicines.  · If you were prescribed an antibiotic medicine, take it as told by your health care provider. Do not stop taking the antibiotic even if you start to feel better.  · Contact a health care provider if you have increased redness, swelling, or pain around your incision. Get help right away if you have chest pain, you vomit, you cough up blood, or you have shortness of breath.  · Keep all follow-up visits as told by your health care provider. This is important.  This information is not intended to replace advice given to you by your health care provider. Make sure you discuss any questions you have with your health care provider.  Document Released: 06/14/2011 Document Revised: 02/20/2018 Document Reviewed: 02/20/2018  Elsevier Patient Education © 2020 [employer] Inc.   

## 2019-02-28 NOTE — H&P (Signed)
   History and Physical Update  The patient was interviewed and re-examined.  The patient's previous History and Physical has been reviewed and is unchanged from recent office visit. Plan to remove right 4th toe in OR today.  Amely Voorheis C. Donzetta Matters, MD [facility] Office: [phone] Pager: [phone]   02/28/2019, 11:28 AM

## 2019-02-28 NOTE — Progress Notes (Signed)
Orthopedic Tech Progress Note Patient Details:  Angela Alvarez [birth year] [id number]  Ortho Devices Type of Ortho Device: Postop shoe/boot Ortho Device/Splint Location: RLE Ortho Device/Splint Interventions: Ordered, Application   Post Interventions Patient Tolerated: Well Instructions Provided: Care of device   Braulio Bosch 02/28/2019, 1:25 PM

## 2019-02-28 NOTE — Anesthesia Postprocedure Evaluation (Signed)
Anesthesia Post Note  Patient: Angela Alvarez  Procedure(s) Performed: AMPUTATION DIGIT - RIGHT FOURTH TOE (Right Foot)     Patient location during evaluation: PACU Anesthesia Type: General Level of consciousness: awake Pain management: pain level controlled Vital Signs Assessment: post-procedure vital signs reviewed and stable Respiratory status: spontaneous breathing Cardiovascular status: stable Postop Assessment: no apparent nausea or vomiting Anesthetic complications: no    Last Vitals:  Vitals:   02/28/19 1245 02/28/19 1300  BP:  (!) 144/70  Pulse: 85 96  Resp: (!) 24 (!) 23  Temp:  36.5 C  SpO2: 98% 97%    Last Pain:  Vitals:   02/28/19 1244  TempSrc:   PainSc: 0-No pain                 Maleko Greulich

## 2019-02-28 NOTE — Op Note (Addendum)
    Patient name: Shalah Estelle MRN: [id number] DOB: 1955-11-09 Sex: female  02/28/2019 Pre-operative Diagnosis: gangrenous changes right fourth toe Post-operative diagnosis:  Same Surgeon:  Erlene Quan C. Donzetta Matters, MD Procedure Performed:  Amputation right fourth toe  Indications: 63 year old female with history of right great toe amputation.  She has palpable pulses.  She has had likely atheroembolic phenomenon to the fourth toe.  She denies any gait of her hypertension.  Findings: There was adequate bleeding.  Tissue was well approximated.   Procedure:  The patient was identified in the holding area and taken to the operating room where she is placed supine upon table and general anesthesia induced.  She was sterilely prepped and draped in the right foot in usual fashion antibiotics were administered timeout was called.  We made a circular incision around the fourth toe.  8 cc of 1% lidocaine was administered.  We removed the bone back to the metatarsal phalangeal joint.  I smoothed the joint with rasp.  I irrigated the wound with a hemostasis closed the skin with interrupted 3-0 nylon suture.  She was awakened anesthesia having tolerated procedure without immediate complication.  All counts were correct completion.  EBL: 10 cc   Brandon C. Donzetta Matters, MD [facility] Office: [phone] Pager: [phone]

## 2019-02-28 NOTE — Anesthesia Preprocedure Evaluation (Addendum)
Anesthesia Evaluation  Patient identified by MRN, date of birth, ID band Patient awake    Reviewed: Allergy & Precautions, NPO status , Patient's Chart, lab work & pertinent test results  Airway Mallampati: II  TM Distance: >3 FB     Dental   Pulmonary former smoker,    breath sounds clear to auscultation       Cardiovascular + Peripheral Vascular Disease   Rhythm:Regular Rate:Normal     Neuro/Psych    GI/Hepatic negative GI ROS, Neg liver ROS,   Endo/Other  negative endocrine ROS  Renal/GU negative Renal ROS     Musculoskeletal  (+) Arthritis ,   Abdominal   Peds  Hematology   Anesthesia Other Findings   Reproductive/Obstetrics                             Anesthesia Physical Anesthesia Plan  ASA: III  Anesthesia Plan: General   Post-op Pain Management:    Induction: Intravenous  PONV Risk Score and Plan: Ondansetron, Dexamethasone and Midazolam  Airway Management Planned: LMA  Additional Equipment:   Intra-op Plan:   Post-operative Plan: Extubation in OR  Informed Consent: I have reviewed the patients History and Physical, chart, labs and discussed the procedure including the risks, benefits and alternatives for the proposed anesthesia with the patient or authorized representative who has indicated his/her understanding and acceptance.     Dental advisory given  Plan Discussed with: CRNA and Anesthesiologist  Anesthesia Plan Comments:         Anesthesia Quick Evaluation

## 2019-02-28 NOTE — Anesthesia Procedure Notes (Signed)
Procedure Name: LMA Insertion °Performed by: Kaydie Petsch H, CRNA °Pre-anesthesia Checklist: Patient identified, Emergency Drugs available, Suction available and Patient being monitored °Patient Re-evaluated:Patient Re-evaluated prior to induction °Oxygen Delivery Method: Circle System Utilized °Preoxygenation: Pre-oxygenation with 100% oxygen °Induction Type: IV induction °Ventilation: Mask ventilation without difficulty °LMA: LMA inserted °LMA Size: 4.0 °Number of attempts: 1 °Airway Equipment and Method: Bite block °Placement Confirmation: positive ETCO2 °Tube secured with: Tape °Dental Injury: Teeth and Oropharynx as per pre-operative assessment  ° ° ° ° ° ° °

## 2019-02-28 NOTE — Transfer of Care (Signed)
Immediate Anesthesia Transfer of Care Note  Patient: Angela Alvarez  Procedure(s) Performed: AMPUTATION DIGIT - RIGHT FOURTH TOE (Right Foot)  Patient Location: PACU  Anesthesia Type:General  Level of Consciousness: awake  Airway & Oxygen Therapy: Patient Spontanous Breathing  Post-op Assessment: Report given to RN and Post -op Vital signs reviewed and stable  Post vital signs: Reviewed and stable  Last Vitals:  Vitals Value Taken Time  BP 105/89 02/28/19 1243  Temp    Pulse 106 02/28/19 1245  Resp 21 02/28/19 1245  SpO2 98 % 02/28/19 1245  Vitals shown include unvalidated device data.  Last Pain:  Vitals:   02/28/19 1244  TempSrc:   PainSc: (P) 0-No pain      Patients Stated Pain Goal: 2 (45/85/92 9244)  Complications: No apparent anesthesia complications

## 2019-03-01 ENCOUNTER — Encounter (HOSPITAL_COMMUNITY): Payer: Self-pay | Admitting: Vascular Surgery

## 2019-03-20 ENCOUNTER — Other Ambulatory Visit: Payer: Self-pay

## 2019-03-20 DIAGNOSIS — I779 Disorder of arteries and arterioles, unspecified: Secondary | ICD-10-CM

## 2019-03-23 ENCOUNTER — Other Ambulatory Visit: Payer: Self-pay

## 2019-03-23 ENCOUNTER — Ambulatory Visit (INDEPENDENT_AMBULATORY_CARE_PROVIDER_SITE_OTHER): Payer: Self-pay | Admitting: Vascular Surgery

## 2019-03-23 ENCOUNTER — Encounter: Payer: Self-pay | Admitting: Vascular Surgery

## 2019-03-23 VITALS — BP 136/79 | HR 90 | Temp 97.4°F | Resp 20 | Ht 66.0 in | Wt 162.2 lb

## 2019-03-23 DIAGNOSIS — I75021 Atheroembolism of right lower extremity: Secondary | ICD-10-CM

## 2019-03-23 DIAGNOSIS — Z89411 Acquired absence of right great toe: Secondary | ICD-10-CM

## 2019-03-23 MED ORDER — XARELTO 2.5 MG PO TABS: 2.5000 mg | ORAL_TABLET | Freq: Every day | ORAL | 3 refills | Status: DC

## 2019-03-23 NOTE — Progress Notes (Signed)
    Subjective:     Patient ID: Angela Alvarez, female   DOB: 1956/02/27, 63 y.o.   MRN: [id number]  HPI 63 year old female follows up after right fourth toe amputation.  Previously had a first toe amputation on the same side.  She is planning for vacation.  She will follow up with podiatry on return.   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  She is currently taking aspirin.  Has been recommended to take 2.5 mg Xarelto daily.  I sent this to her pharmacy although cost may be prohibitive.  She will follow-up in 1 year with repeat ABIs.  Okay for podiatry procedures as needed.    Breslin Hemann C. Donzetta Matters, MD [facility] [place] Office: [phone] Pager: [phone]

## 2019-04-09 ENCOUNTER — Other Ambulatory Visit: Payer: Self-pay | Admitting: *Deleted

## 2019-04-09 DIAGNOSIS — I739 Peripheral vascular disease, unspecified: Secondary | ICD-10-CM

## 2019-04-17 ENCOUNTER — Emergency Department (HOSPITAL_COMMUNITY): Payer: 59

## 2019-04-17 ENCOUNTER — Other Ambulatory Visit: Payer: Self-pay

## 2019-04-17 ENCOUNTER — Encounter (HOSPITAL_COMMUNITY): Payer: Self-pay

## 2019-04-17 ENCOUNTER — Inpatient Hospital Stay (HOSPITAL_COMMUNITY)
Admission: EM | Admit: 2019-04-17 | Discharge: 2019-04-23 | DRG: 064 | Disposition: A | Discharge status: Hospice | Payer: 59 | Attending: Neurology | Admitting: Neurology

## 2019-04-17 DIAGNOSIS — G936 Cerebral edema: Secondary | ICD-10-CM | POA: Diagnosis not present

## 2019-04-17 DIAGNOSIS — I739 Peripheral vascular disease, unspecified: Secondary | ICD-10-CM | POA: Diagnosis present

## 2019-04-17 DIAGNOSIS — Z823 Family history of stroke: Secondary | ICD-10-CM

## 2019-04-17 DIAGNOSIS — Z89421 Acquired absence of other right toe(s): Secondary | ICD-10-CM

## 2019-04-17 DIAGNOSIS — Z79899 Other long term (current) drug therapy: Secondary | ICD-10-CM

## 2019-04-17 DIAGNOSIS — R29722 NIHSS score 22: Secondary | ICD-10-CM | POA: Diagnosis present

## 2019-04-17 DIAGNOSIS — E876 Hypokalemia: Secondary | ICD-10-CM | POA: Diagnosis present

## 2019-04-17 DIAGNOSIS — Z6824 Body mass index (BMI) 24.0-24.9, adult: Secondary | ICD-10-CM

## 2019-04-17 DIAGNOSIS — Z87891 Personal history of nicotine dependence: Secondary | ICD-10-CM

## 2019-04-17 DIAGNOSIS — I10 Essential (primary) hypertension: Secondary | ICD-10-CM | POA: Diagnosis present

## 2019-04-17 DIAGNOSIS — R414 Neurologic neglect syndrome: Secondary | ICD-10-CM | POA: Diagnosis present

## 2019-04-17 DIAGNOSIS — H510 Palsy (spasm) of conjugate gaze: Secondary | ICD-10-CM | POA: Diagnosis present

## 2019-04-17 DIAGNOSIS — Z7951 Long term (current) use of inhaled steroids: Secondary | ICD-10-CM

## 2019-04-17 DIAGNOSIS — Z96651 Presence of right artificial knee joint: Secondary | ICD-10-CM | POA: Diagnosis present

## 2019-04-17 DIAGNOSIS — R4701 Aphasia: Secondary | ICD-10-CM | POA: Diagnosis present

## 2019-04-17 DIAGNOSIS — I63411 Cerebral infarction due to embolism of right middle cerebral artery: Secondary | ICD-10-CM | POA: Diagnosis not present

## 2019-04-17 DIAGNOSIS — Z20822 Contact with and (suspected) exposure to covid-19: Secondary | ICD-10-CM | POA: Diagnosis present

## 2019-04-17 DIAGNOSIS — Z515 Encounter for palliative care: Secondary | ICD-10-CM | POA: Diagnosis not present

## 2019-04-17 DIAGNOSIS — M79671 Pain in right foot: Secondary | ICD-10-CM | POA: Diagnosis present

## 2019-04-17 DIAGNOSIS — R7303 Prediabetes: Secondary | ICD-10-CM | POA: Diagnosis present

## 2019-04-17 DIAGNOSIS — E441 Mild protein-calorie malnutrition: Secondary | ICD-10-CM | POA: Diagnosis present

## 2019-04-17 DIAGNOSIS — R0682 Tachypnea, not elsewhere classified: Secondary | ICD-10-CM | POA: Diagnosis present

## 2019-04-17 DIAGNOSIS — R0602 Shortness of breath: Secondary | ICD-10-CM

## 2019-04-17 DIAGNOSIS — D72829 Elevated white blood cell count, unspecified: Secondary | ICD-10-CM | POA: Diagnosis present

## 2019-04-17 DIAGNOSIS — R531 Weakness: Secondary | ICD-10-CM | POA: Diagnosis not present

## 2019-04-17 DIAGNOSIS — I639 Cerebral infarction, unspecified: Secondary | ICD-10-CM | POA: Diagnosis not present

## 2019-04-17 DIAGNOSIS — Z66 Do not resuscitate: Secondary | ICD-10-CM | POA: Diagnosis not present

## 2019-04-17 DIAGNOSIS — J449 Chronic obstructive pulmonary disease, unspecified: Secondary | ICD-10-CM | POA: Diagnosis present

## 2019-04-17 DIAGNOSIS — E785 Hyperlipidemia, unspecified: Secondary | ICD-10-CM | POA: Diagnosis present

## 2019-04-17 DIAGNOSIS — M79673 Pain in unspecified foot: Secondary | ICD-10-CM | POA: Diagnosis present

## 2019-04-17 DIAGNOSIS — Z7901 Long term (current) use of anticoagulants: Secondary | ICD-10-CM

## 2019-04-17 DIAGNOSIS — G43909 Migraine, unspecified, not intractable, without status migrainosus: Secondary | ICD-10-CM | POA: Diagnosis present

## 2019-04-17 DIAGNOSIS — R059 Cough, unspecified: Secondary | ICD-10-CM

## 2019-04-17 DIAGNOSIS — Z7982 Long term (current) use of aspirin: Secondary | ICD-10-CM

## 2019-04-17 DIAGNOSIS — R05 Cough: Secondary | ICD-10-CM

## 2019-04-17 DIAGNOSIS — I472 Ventricular tachycardia: Secondary | ICD-10-CM | POA: Diagnosis not present

## 2019-04-17 DIAGNOSIS — E870 Hyperosmolality and hypernatremia: Secondary | ICD-10-CM | POA: Diagnosis present

## 2019-04-17 DIAGNOSIS — G8194 Hemiplegia, unspecified affecting left nondominant side: Secondary | ICD-10-CM | POA: Diagnosis present

## 2019-04-17 DIAGNOSIS — R1310 Dysphagia, unspecified: Secondary | ICD-10-CM

## 2019-04-17 LAB — DIFFERENTIAL
Abs Immature Granulocytes: 0.06 10*3/uL (ref 0.00–0.07)
Basophils Absolute: 0.1 10*3/uL (ref 0.0–0.1)
Basophils Relative: 1 %
Eosinophils Absolute: 0 10*3/uL (ref 0.0–0.5)
Eosinophils Relative: 0 %
Immature Granulocytes: 1 %
Lymphocytes Relative: 10 %
Lymphs Abs: 1.3 10*3/uL (ref 0.7–4.0)
Monocytes Absolute: 1.1 10*3/uL — ABNORMAL HIGH (ref 0.1–1.0)
Monocytes Relative: 9 %
Neutro Abs: 10.1 10*3/uL — ABNORMAL HIGH (ref 1.7–7.7)
Neutrophils Relative %: 79 %

## 2019-04-17 LAB — CK: Total CK: 312 U/L — ABNORMAL HIGH (ref 38–234)

## 2019-04-17 LAB — COMPREHENSIVE METABOLIC PANEL
ALT: 28 U/L (ref 0–44)
AST: 30 U/L (ref 15–41)
Albumin: 3.4 g/dL — ABNORMAL LOW (ref 3.5–5.0)
Alkaline Phosphatase: 73 U/L (ref 38–126)
Anion gap: 11 (ref 5–15)
BUN: 25 mg/dL — ABNORMAL HIGH (ref 8–23)
CO2: 24 mmol/L (ref 22–32)
Calcium: 8.9 mg/dL (ref 8.9–10.3)
Chloride: 106 mmol/L (ref 98–111)
Creatinine, Ser: 0.83 mg/dL (ref 0.44–1.00)
GFR calc Af Amer: >60 mL/min (ref >60)
GFR calc non Af Amer: >60 mL/min (ref >60)
Glucose, Bld: 127 mg/dL — ABNORMAL HIGH (ref 70–99)
Potassium: 3.8 mmol/L (ref 3.5–5.1)
Sodium: 141 mmol/L (ref 135–145)
Total Bilirubin: 1.1 mg/dL (ref 0.3–1.2)
Total Protein: 7.2 g/dL (ref 6.5–8.1)

## 2019-04-17 LAB — CBC
HCT: 43.7 % (ref 36.0–46.0)
Hemoglobin: 13.5 g/dL (ref 12.0–15.0)
MCH: 27 pg (ref 26.0–34.0)
MCHC: 30.9 g/dL (ref 30.0–36.0)
MCV: 87.4 fL (ref 80.0–100.0)
Platelets: 422 10*3/uL — ABNORMAL HIGH (ref 150–400)
RBC: 5 MIL/uL (ref 3.87–5.11)
RDW: 14.4 % (ref 11.5–15.5)
WBC: 12.7 10*3/uL — ABNORMAL HIGH (ref 4.0–10.5)
nRBC: 0 % (ref 0.0–0.2)

## 2019-04-17 LAB — CBG MONITORING, ED: Glucose-Capillary: 121 mg/dL — ABNORMAL HIGH (ref 70–99)

## 2019-04-17 LAB — ETHANOL: Alcohol, Ethyl (B): <10 mg/dL (ref <10)

## 2019-04-17 LAB — PROTIME-INR
INR: 1.1 (ref 0.8–1.2)
Prothrombin Time: 14.1 seconds (ref 11.4–15.2)

## 2019-04-17 MED ORDER — SODIUM CHLORIDE 0.9 % IV BOLUS: 1000.0000 mL | Freq: Once | INTRAVENOUS | Status: DC

## 2019-04-17 NOTE — ED Triage Notes (Signed)
Patient arrived by EMS from home for a welfare check because she had not been seen by family since Saturday 04-14-19. On scene, pt presented with stroke symptoms: L sided weakness, aphasia. Pt found in prone position, possible fall, no signs of trauma. Pressure ulcer noted on right abdomen.

## 2019-04-17 NOTE — ED Provider Notes (Signed)
[facility] EMERGENCY DEPARTMENT Provider Note   CSN: [id number] Arrival date & time: 04/17/19  2145     History No chief complaint on file.  LEVEL 5 CAVEAT - ALTERED MENTAL STATUS; PT NONVERBAL  Angela Alvarez is a 64 y.o. female with PMHx COPD who presents to the ED via EMS for stroke like symptoms.  Per EMS they were called out for a welfare check because family had not seen or heard from her since Saturday, 04/14/2019.  When EMS arrived patient was found at the top of the stairs laying flat on her abdomen.  She had strokelike symptoms including left-sided weakness and aphasia. Medications that were brought in with patient include Xarelto, atorvastatin, doxycycline, Zetia.  CBG 121.    The history is provided by the EMS personnel. The history is limited by the condition of the patient.       Past Medical History:  Diagnosis Date  . Abnormal Pap smear of cervix 06-01-13   ascus pap:pos.HR HPV/hx of cryotherapy to cx 1985  . Arthritis    knee  . Asthma   . COPD (chronic obstructive pulmonary disease) (HCC)   . Critical lower limb ischemia    ischemic appearing left first toe  . Headache    MMigraines  . Peripheral vascular disease [facility])     Patient Active Problem List   Diagnosis Date Noted  . Dyspnea on exertion 06/30/2015  . Atherosclerosis of native arteries of extremities with gangrene, unspecified extremity (HCC) 05/19/2015  . Critical lower limb ischemia 04/09/2015  . ASCUS with positive high risk human papillomavirus of vagina 01/09/2014  . Foot pain 12/04/2010  . Metatarsalgia 12/04/2010  . Right knee pain 11/23/2010  . SMOKER 01/08/2010  . COPD 01/07/2010    Past Surgical History:  Procedure Laterality Date  . ABDOMINAL AORTOGRAM W/LOWER EXTREMITY Bilateral 09/11/2018   Procedure: ABDOMINAL AORTOGRAM W/LOWER EXTREMITY;  Surgeon: Maeola Harman, MD;  Location: [facility] INVASIVE CV LAB;  Service: Cardiovascular;  Laterality: Bilateral;    . AMPUTATION Right 05/19/2015   Procedure: RIGHT FIRST RAY AMPUTATION;  Surgeon: Sherren Kerns, MD;  Location: [facility] OR;  Service: Vascular;  Laterality: Right;  . AMPUTATION Right 02/28/2019   Procedure: AMPUTATION DIGIT - RIGHT FOURTH TOE;  Surgeon: Maeola Harman, MD;  Location: [facility] OR;  Service: Vascular;  Laterality: Right;  . BREAST SURGERY Right    cyst  . BUNIONECTOMY Bilateral    bilateral  . CARDIAC CATHETERIZATION Bilateral    Catarct  . COLONOSCOPY W/ POLYPECTOMY    . KNEE ARTHROPLASTY Right 07/21/2017  . PERIPHERAL VASCULAR CATHETERIZATION N/A 04/28/2015   Procedure: Lower Extremity Angiography;  Surgeon: Runell Gess, MD;  Location: [facility] INVASIVE CV LAB;  Service: Cardiovascular;  Laterality: N/A;  . PERIPHERAL VASCULAR INTERVENTION Left 09/11/2018   Procedure: PERIPHERAL VASCULAR INTERVENTION;  Surgeon: Maeola Harman, MD;  Location: [facility] INVASIVE CV LAB;  Service: Cardiovascular;  Laterality: Left;  SFA stent  . surgery under arm Right    --benign breast tissue     OB History    Gravida  1   Para      Term      Preterm      AB  1   Living        SAB  1   TAB      Ectopic      Multiple      Live Births  Family History  Problem Relation Age of Onset  . Stroke Mother        dec age 12  . Hypertension Mother   . Cancer Father        lung age 57  . Cancer Sister        dec colon ca age 2  . Hypertension Sister   . Diabetes Neg Hx   . Heart attack Neg Hx   . Hyperlipidemia Neg Hx   . Sudden death Neg Hx     Social History   Tobacco Use  . Smoking status: Former Smoker    Packs/day: 1.00    Years: 45.00    Pack years: 45.00    Types: Cigarettes    Quit date: 06/07/2015    Years since quitting: 3.8  . Smokeless tobacco: Never Used  Substance Use Topics  . Alcohol use: No    Alcohol/week: 0.0 standard drinks  . Drug use: No    Home Medications Prior to Admission medications   Medication Sig Start  Date End Date Taking? Authorizing Provider  albuterol (PROVENTIL) (2.5 MG/3ML) 0.083% nebulizer solution Take 2.5 mg by nebulization every 6 (six) hours as needed for wheezing or shortness of breath.    [provider]  albuterol (PROVENTIL,VENTOLIN) 90 MCG/ACT inhaler Inhale 2 puffs into the lungs every 4 (four) hours as needed for wheezing or shortness of breath.     [provider]  aspirin EC 81 MG tablet Take 81 mg by mouth at bedtime.     [provider]  atorvastatin (LIPITOR) 40 MG tablet Take 40 mg by mouth at bedtime.  02/21/19   [provider]  budesonide-formoterol (SYMBICORT) 160-4.5 MCG/ACT inhaler Inhale 2 puffs into the lungs 2 (two) times daily.      [provider]  clobetasol cream (TEMOVATE) 0.05 % APPLY TOPICALLY TWICE WEEKLY Patient taking differently: Apply 1 application topically 2 (two) times a week.  05/24/14   Nunzio Cobbs, MD  ezetimibe (ZETIA) 10 MG tablet Take 10 mg by mouth daily. 03/13/19   [provider]  HYDROcodone-acetaminophen (NORCO) 5-325 MG tablet Take 1 tablet by mouth every 6 (six) hours as needed for moderate pain. 02/28/19   Dagoberto Ligas, PA-C  ibuprofen (ADVIL) 200 MG tablet Take 800 mg by mouth every 8 (eight) hours as needed for headache or moderate pain (severe pain.).     [provider]  Ibuprofen-diphenhydrAMINE HCl (ADVIL PM) 200-25 MG CAPS Take 2 tablets by mouth at bedtime as needed (sleep/pain.).    [provider]  ondansetron (ZOFRAN) 4 MG tablet Take 1 tablet (4 mg total) by mouth every 8 (eight) hours as needed for nausea or vomiting. 02/28/19   Waynetta Sandy, MD  rivaroxaban (XARELTO) 2.5 MG TABS tablet Take 1 tablet (2.5 mg total) by mouth daily. 03/23/19   Waynetta Sandy, MD    Allergies    Amoxicillin-pot clavulanate and Levofloxacin  Review of Systems   Review of Systems  Unable to perform ROS: Mental status change     Physical Exam Updated Vital Signs BP (!) 146/120   Pulse 88   Temp 97.8 F (36.6 C) (Rectal)   Resp 17   LMP 04/05/2004   SpO2 97%   Physical Exam Vitals and nursing note reviewed.  HENT:     Head: Normocephalic and atraumatic.  Eyes:     Extraocular Movements: Extraocular movements intact.  Neck:     Comments: C-collar in place  Cardiovascular:     Rate and Rhythm: Regular rhythm. Tachycardia present.     Pulses: Normal pulses.  Pulmonary:     Effort: Pulmonary effort is normal.     Breath sounds: Normal breath sounds. No wheezing, rhonchi or rales.  Abdominal:     Palpations: Abdomen is soft.     Tenderness: There is no abdominal tenderness. There is no guarding or rebound.  Musculoskeletal:     Cervical back: Neck supple.     Comments: Ecchymosis noted to left shoulder, left hip.   Skin:    General: Skin is warm and dry.  Neurological:     Mental Status: She is alert.     GCS: GCS eye subscore is 3. GCS verbal subscore is 1. GCS motor subscore is 6.     Comments: Aphasia noted.  Unable to move left side.  Equal grip strength on right side 5 out of 5.  Able to lift right leg.      ED Results / Procedures / Treatments   Labs (all labs ordered are listed, but only abnormal results are displayed) Labs Reviewed  CBC - Abnormal; Notable for the following components:      Result Value   WBC 12.7 (*)    Platelets 422 (*)    All other components within normal limits  DIFFERENTIAL - Abnormal; Notable for the following components:   Neutro Abs 10.1 (*)    Monocytes Absolute 1.1 (*)    All other components within normal limits  CBG MONITORING, ED - Abnormal; Notable for the following components:   Glucose-Capillary 121 (*)    All other components within normal limits  CULTURE, BLOOD (ROUTINE X 2)  CULTURE, BLOOD (ROUTINE X 2)  RESPIRATORY PANEL BY RT PCR (FLU A&B, COVID)  ETHANOL  PROTIME-INR  APTT  COMPREHENSIVE METABOLIC PANEL  RAPID URINE DRUG SCREEN, HOSP  PERFORMED  URINALYSIS, ROUTINE W REFLEX MICROSCOPIC  LACTIC ACID, PLASMA  LACTIC ACID, PLASMA  CK  I-STAT CHEM 8, ED  TROPONIN I (HIGH SENSITIVITY)  TROPONIN I (HIGH SENSITIVITY)    EKG EKG Interpretation  Date/Time:  Tuesday April 17 2019 22:26:12 EST Ventricular Rate:  99 PR Interval:    QRS Duration: 140 QT Interval:  400 QTC Calculation: 514 R Axis:   50 Text Interpretation: Sinus rhythm RAE, consider biatrial enlargement Right bundle branch block No prior ECG for comparison. No STEMI Confirmed by Theda Belfast ([id number]) on 04/17/2019 10:33:15 PM   Radiology CT Head Wo Contrast  Result Date: 04/17/2019 CLINICAL DATA:  64 year old female with focal neurologic deficit. EXAM: CT HEAD WITHOUT CONTRAST TECHNIQUE: Contiguous axial images were obtained from the base of the skull through the vertex without intravenous contrast. COMPARISON:  None. FINDINGS: Brain: There is a large area of low attenuation with loss of gray-white matter discrimination involving the right MCA territory consistent with an acute or subacute infarct. There is associated edema and overall increased volume with associated mass effect and compression of the right cerebral sulci and right lateral ventricle. There is approximately 5 mm right to left midline shift. There is no acute intracranial hemorrhage. Vascular: Large right MCA clot measuring approximately 6 mm. There is probable occlusion of the right supraclinoid ICA. Further evaluation with CT angiography recommended. Skull: Normal. Negative for fracture or focal lesion. Sinuses/Orbits: The visualized paranasal sinuses and mastoid air cells are clear. Other: None IMPRESSION: Occlusion of the right MCA and possibly portions of the right supraclinoid ICA with a large area of right MCA  territory infarct, likely acute or subacute. There is associated mass effect and approximately 5 mm right to left midline shift. No acute intracranial hemorrhage. These results were  called by telephone at the time of interpretation on 04/17/2019 at 11:06 pm to Dr. Webb Silversmith who verbally acknowledged these results. Electronically Signed   By: Elgie Collard M.D.   On: 04/17/2019 23:11   CT Cervical Spine Wo Contrast  Result Date: 04/17/2019 CLINICAL DATA:  Possible fall EXAM: CT CERVICAL SPINE WITHOUT CONTRAST TECHNIQUE: Multidetector CT imaging of the cervical spine was performed without intravenous contrast. Multiplanar CT image reconstructions were also generated. COMPARISON:  None. FINDINGS: Alignment: No subluxation Skull base and vertebrae: No acute fracture. No primary bone lesion or focal pathologic process. Soft tissues and spinal canal: No prevertebral fluid or swelling. No visible canal hematoma. Disc levels: Degenerative disc disease from C4-5 through C6-7. Diffuse degenerative facet disease. Upper chest: No acute findings Other: Carotid artery calcifications. IMPRESSION: Degenerative disc and facet disease.  No acute bony abnormality. Electronically Signed   By: Charlett Nose M.D.   On: 04/17/2019 22:57    Procedures .Critical Care Performed by: Tanda Rockers, PA-C Authorized by: Tanda Rockers, PA-C   Critical care provider statement:    Critical care time (minutes):  45   Critical care was necessary to treat or prevent imminent or life-threatening deterioration of the following conditions:  CNS failure or compromise   Critical care was time spent personally by me on the following activities:  Discussions with consultants, evaluation of patient's response to treatment, examination of patient, ordering and performing treatments and interventions, ordering and review of laboratory studies, ordering and review of radiographic studies, pulse oximetry, re-evaluation of patient's condition, obtaining history from patient or surrogate and review of old charts   (including critical care time)  Medications Ordered in ED Medications - No data to display  ED Course  I  have reviewed the triage vital signs and the nursing notes.  Pertinent labs & imaging results that were available during my care of the patient were reviewed by me and considered in my medical decision making (see chart for details).  64 year old female who presents to the ED with last known normal 3 days ago, initially called out for a welfare check and found down laying on abdomen with left-sided weakness and aphasia.  My exam patient with a GCS of 10.  She is able to follow commands on the right side but unable to move her left side at all.  Given that she is 3 days out code stroke not initiated.  Will obtain CT head and CT C-spine at this time.  Patient does have ecchymosis noted to her shoulder and hip as well.  Will obtain x-rays.  Will work-up with screening labs at this time.  Vitals are stable. Pt is afebrile without tachycardia or tachypnea. Rectal temp 97.8. EKG without signs of STEMI today.    CT occlusion of the right MCA; will consult neurology at this time. Radiology has recommend CTAs. Will order.   CBC with leukocytosis of 12. Still awaiting remainder of labwork. Pt will need to be admitted.   11:46 PM At shift change case signed out to Harlene Salts, [place], who will admit patient after labwork returns. Pt will need stroke work up inpatient.   Clinical Course as of Apr 16 2344  Tue Apr 17, 2019  2321 Dr. Wilford Corner will evaluate patient; recommends medicine admission   [MV]  2335 Follow results call medicine   [  BM]    Clinical Course User Index [BM] Bill SalinasMorelli, Brandon A, PA-C [MV] Tanda RockersVenter, Lavone Weisel, PA-C   MDM Rules/Calculators/A&P                       Final Clinical Impression(s) / ED Diagnoses Final diagnoses:  Cerebrovascular accident (CVA), unspecified mechanism [facility](HCC)    Rx / DC Orders ED Discharge Orders    None       Tanda RockersVenter, Kyrie Fludd, PA-C 04/17/19 2347    Tegeler, Canary Brimhristopher J, MD 04/20/19 1733

## 2019-04-17 NOTE — ED Notes (Signed)
Patient transported to CT 

## 2019-04-17 NOTE — H&P (Signed)
Neurology Consultation  Reason for Consult: Stroke Referring Physician: Dr. Sherry Ruffing  CC: Found down, left-sided weakness  History is obtained from: Chart review, call to a friend-emergency contact listed on the phone  HPI: Angela Alvarez is a 64 y.o. female past medical history of peripheral vascular disease status post toe amputations, headaches, COPD, presented to the emergency room via EMS because family had not heard from him since Saturday, 04/14/2019 and requested a welfare check.  She was found down on the ground in prone position. Brought into the emergency room.  Evaluated by ED providers.  Noted to have left-sided weakness and speech difficulties. Noncontrast head CT was done that showed a completed right MCA territory infarct with possible occlusion of the right MCA and possibly portions of the supraclinoid ICA due to the hyperdensity seen on the noncontrast scan. Neurology was consulted for further recommendations. Patient unable to provide any history. No close family around-recently reconnected with a sister-information unavailable.   LKW: Sometime on Saturday, 04/14/2019 tpa given?: no, outside the window, completed stroke Premorbid modified Rankin scale (mRS): 0  ROS: Unable to ascertain due to her mentation Past Medical History:  Diagnosis Date  . Abnormal Pap smear of cervix 06-01-13   ascus pap:pos.HR HPV/hx of cryotherapy to cx 1985  . Arthritis    knee  . Asthma   . COPD (chronic obstructive pulmonary disease) ([place])   . Critical lower limb ischemia    ischemic appearing left first toe  . Headache    MMigraines  . Peripheral vascular disease ([place])    Family History  Problem Relation Age of Onset  . Stroke Mother        dec age 62  . Hypertension Mother   . Cancer Father        lung age 55  . Cancer Sister        dec colon ca age 50  . Hypertension Sister   . Diabetes Neg Hx   . Heart attack Neg Hx   . Hyperlipidemia Neg Hx   . Sudden death Neg Hx      Social History:   reports that she quit smoking about 3 years ago. Her smoking use included cigarettes. She has a 45.00 pack-year smoking history. She has never used smokeless tobacco. She reports that she does not drink alcohol or use drugs.  Medications No current facility-administered medications for this encounter.  Current Outpatient Medications:  .  albuterol (PROVENTIL) (2.5 MG/3ML) 0.083% nebulizer solution, Take 2.5 mg by nebulization every 6 (six) hours as needed for wheezing or shortness of breath., Disp: , Rfl:  .  albuterol (PROVENTIL,VENTOLIN) 90 MCG/ACT inhaler, Inhale 2 puffs into the lungs every 4 (four) hours as needed for wheezing or shortness of breath. , Disp: , Rfl:  .  aspirin EC 81 MG tablet, Take 81 mg by mouth at bedtime. , Disp: , Rfl:  .  atorvastatin (LIPITOR) 40 MG tablet, Take 40 mg by mouth at bedtime. , Disp: , Rfl:  .  budesonide-formoterol (SYMBICORT) 160-4.5 MCG/ACT inhaler, Inhale 2 puffs into the lungs 2 (two) times daily.  , Disp: , Rfl:  .  clobetasol cream (TEMOVATE) 0.05 %, APPLY TOPICALLY TWICE WEEKLY (Patient taking differently: Apply 1 application topically 2 (two) times a week. ), Disp: 30 g, Rfl: 0 .  ezetimibe (ZETIA) 10 MG tablet, Take 10 mg by mouth daily., Disp: , Rfl:  .  HYDROcodone-acetaminophen (NORCO) 5-325 MG tablet, Take 1 tablet by mouth every 6 (six) hours as  needed for moderate pain., Disp: 20 tablet, Rfl: 0 .  ibuprofen (ADVIL) 200 MG tablet, Take 800 mg by mouth every 8 (eight) hours as needed for headache or moderate pain (severe pain.). , Disp: , Rfl:  .  Ibuprofen-diphenhydrAMINE HCl (ADVIL PM) 200-25 MG CAPS, Take 2 tablets by mouth at bedtime as needed (sleep/pain.)., Disp: , Rfl:  .  ondansetron (ZOFRAN) 4 MG tablet, Take 1 tablet (4 mg total) by mouth every 8 (eight) hours as needed for nausea or vomiting., Disp: 20 tablet, Rfl: 0 .  rivaroxaban (XARELTO) 2.5 MG TABS tablet, Take 1 tablet (2.5 mg total) by mouth daily.,  Disp: 30 tablet, Rfl: 3  Exam: Current vital signs: BP (!) 146/120   Pulse 88   Temp 97.8 F (36.6 C) (Rectal)   Resp 17   LMP 04/05/2004   SpO2 97%  Vital signs in last 24 hours: Temp:  [97.8 F (36.6 C)] 97.8 F (36.6 C) (01/12 2234) Pulse Rate:  [88] 88 (01/12 2230) Resp:  [17] 17 (01/12 2230) BP: (146)/(120) 146/120 (01/12 2230) SpO2:  [97 %] 97 % (01/12 2230) General: Drowsy HEENT: Normocephalic atraumatic Lungs: Clear to auscultation Cardiovascular: Regular rate rhythm Abdomen: Soft nondistended nontender Extremities warm well perfused Neurological exam Drowsy, opens eyes to voice. Does not follow commands consistently Speech is extremely dysarthric Follows some simple commands inconsistently Cranial nerves: Pupils are equal round reactive light, there is forced gaze deviation to the right, does not blink to threat from the left, whole right face weakness. Motor exam: No effort on the left upper extremity is antigravity even with noxious immolation.  Left lower extremity with mild withdrawal to noxious stimulation if that.  Right upper and lower extremity are full strength. Sensory exam: Extremely diminished to light touch and noxious immolation on the left. Also neglecting the left side. Coordination: Difficult to assess NIH stroke scale 22 GCS 10-11  Labs I have reviewed labs in epic and the results pertinent to this consultation are: CBC    Component Value Date/Time   WBC 12.7 (H) 04/17/2019 2204   RBC 5.00 04/17/2019 2204   HGB 13.5 04/17/2019 2204   HCT 43.7 04/17/2019 2204   PLT 422 (H) 04/17/2019 2204   MCV 87.4 04/17/2019 2204   MCH 27.0 04/17/2019 2204   MCHC 30.9 04/17/2019 2204   RDW 14.4 04/17/2019 2204   LYMPHSABS 1.3 04/17/2019 2204   MONOABS 1.1 (H) 04/17/2019 2204   EOSABS 0.0 04/17/2019 2204   BASOSABS 0.1 04/17/2019 2204   CMP     Component Value Date/Time   NA 141 04/17/2019 2204   K 3.8 04/17/2019 2204   CL 106 04/17/2019 2204    CO2 24 04/17/2019 2204   GLUCOSE 127 (H) 04/17/2019 2204   BUN 25 (H) 04/17/2019 2204   CREATININE 0.83 04/17/2019 2204   CREATININE 0.73 04/25/2015 1128   CALCIUM 8.9 04/17/2019 2204   PROT 7.2 04/17/2019 2204   ALBUMIN 3.4 (L) 04/17/2019 2204   AST 30 04/17/2019 2204   ALT 28 04/17/2019 2204   ALKPHOS 73 04/17/2019 2204   BILITOT 1.1 04/17/2019 2204   GFRNONAA >60 04/17/2019 2204   GFRAA >60 04/17/2019 2204  CK-312   Imaging I have reviewed the images obtained:  CT-scan of the brain-completed right MCA stroke with possible carotid occlusion.  5 mm midline shift. CTA head and neck-carotid occlusion-internal carotid on the right occluded right at the bifurcation.  Occluded right MCA with retrograde filling.  Possible A1 occlusion also  filling through the account.   Assessment: 63 year old with above past medical history, found down when EMS went to check on her after family requested welfare check having not heard from the patient since Saturday, 04/14/2019. Being found down with left-sided weakness, speech difficulty. CT with a completed right MCA stroke with 5 mm midline shift.  CTA with right internal carotid occlusion, right MCA occlusion. Outside the window for acute intervention or TPA.  Unclear source of the clot-also on Xarelto for peripheral vascular disease.  Impression: Acute ischemic stroke-etiology unclear-cardioembolic versus atheroembolic  Recommendations: Admit to neurological ICU under stroke service Due to the midline shift-would recommend starting hypertonic saline 75 cc an hour. Monitor sodiums every 6 Check morning labs MRI of the brain when able to Frequent neurochecks 2D echo, A1c, lipid panel. Outside the window also for hemicraniectomy since the stroke is about 10 days old now and hemicraniectomy is not very helpful after the initial 24 to 48 hours based on the limited studies available. Hold Xarelto Aspirin 325 Draw blood cultures, chest x-ray,  urinalysis as she has a mild white count  N.p.o. Full code Attempt to reach family in the morning for further clarifications on goals of care as she progresses.  Present on admission: -Acute ischemic stroke -Hemiplegia -Possible aspiration pneumonia -Cerebral edema  -- Milon Dikes, MD [facility] Pager: [phone] If 7pm to 7am, please call on call as listed on AMION.   CRITICAL CARE ATTESTATION Performed by: Milon Dikes, MD Total critical care time: 60 minutes Critical care time was exclusive of separately billable procedures and treating other patients and/or supervising APPs/Residents/Students Critical care was necessary to treat or prevent imminent or life-threatening deterioration due to acute ischemic stroke, cerebral edema. This patient is critically ill and at significant risk for neurological worsening and/or death and care requires constant monitoring. Critical care was time spent personally by me on the following activities: development of treatment plan with patient and/or surrogate as well as nursing, discussions with consultants, evaluation of patient's response to treatment, examination of patient, obtaining history from patient or surrogate, ordering and performing treatments and interventions, ordering and review of laboratory studies, ordering and review of radiographic studies, pulse oximetry, re-evaluation of patient's condition, participation in multidisciplinary rounds and medical decision making of high complexity in the care of this patient.

## 2019-04-18 ENCOUNTER — Inpatient Hospital Stay (HOSPITAL_COMMUNITY): Payer: 59

## 2019-04-18 ENCOUNTER — Emergency Department (HOSPITAL_COMMUNITY): Payer: 59

## 2019-04-18 DIAGNOSIS — G936 Cerebral edema: Secondary | ICD-10-CM | POA: Diagnosis not present

## 2019-04-18 DIAGNOSIS — Z7982 Long term (current) use of aspirin: Secondary | ICD-10-CM | POA: Diagnosis not present

## 2019-04-18 DIAGNOSIS — E782 Mixed hyperlipidemia: Secondary | ICD-10-CM

## 2019-04-18 DIAGNOSIS — R4701 Aphasia: Secondary | ICD-10-CM | POA: Diagnosis present

## 2019-04-18 DIAGNOSIS — R0682 Tachypnea, not elsewhere classified: Secondary | ICD-10-CM | POA: Diagnosis not present

## 2019-04-18 DIAGNOSIS — M79671 Pain in right foot: Secondary | ICD-10-CM | POA: Diagnosis not present

## 2019-04-18 DIAGNOSIS — Z7901 Long term (current) use of anticoagulants: Secondary | ICD-10-CM | POA: Diagnosis not present

## 2019-04-18 DIAGNOSIS — D72829 Elevated white blood cell count, unspecified: Secondary | ICD-10-CM | POA: Diagnosis present

## 2019-04-18 DIAGNOSIS — Z7951 Long term (current) use of inhaled steroids: Secondary | ICD-10-CM | POA: Diagnosis not present

## 2019-04-18 DIAGNOSIS — G8194 Hemiplegia, unspecified affecting left nondominant side: Secondary | ICD-10-CM | POA: Diagnosis present

## 2019-04-18 DIAGNOSIS — R7303 Prediabetes: Secondary | ICD-10-CM

## 2019-04-18 DIAGNOSIS — Z89421 Acquired absence of other right toe(s): Secondary | ICD-10-CM | POA: Diagnosis not present

## 2019-04-18 DIAGNOSIS — I10 Essential (primary) hypertension: Secondary | ICD-10-CM | POA: Diagnosis not present

## 2019-04-18 DIAGNOSIS — R4189 Other symptoms and signs involving cognitive functions and awareness: Secondary | ICD-10-CM

## 2019-04-18 DIAGNOSIS — R1310 Dysphagia, unspecified: Secondary | ICD-10-CM | POA: Diagnosis present

## 2019-04-18 DIAGNOSIS — J449 Chronic obstructive pulmonary disease, unspecified: Secondary | ICD-10-CM | POA: Diagnosis present

## 2019-04-18 DIAGNOSIS — I639 Cerebral infarction, unspecified: Secondary | ICD-10-CM

## 2019-04-18 DIAGNOSIS — E785 Hyperlipidemia, unspecified: Secondary | ICD-10-CM | POA: Diagnosis present

## 2019-04-18 DIAGNOSIS — R414 Neurologic neglect syndrome: Secondary | ICD-10-CM | POA: Diagnosis present

## 2019-04-18 DIAGNOSIS — I350 Nonrheumatic aortic (valve) stenosis: Secondary | ICD-10-CM

## 2019-04-18 DIAGNOSIS — I63231 Cerebral infarction due to unspecified occlusion or stenosis of right carotid arteries: Secondary | ICD-10-CM

## 2019-04-18 DIAGNOSIS — R1312 Dysphagia, oropharyngeal phase: Secondary | ICD-10-CM

## 2019-04-18 DIAGNOSIS — I739 Peripheral vascular disease, unspecified: Secondary | ICD-10-CM

## 2019-04-18 DIAGNOSIS — I63411 Cerebral infarction due to embolism of right middle cerebral artery: Secondary | ICD-10-CM | POA: Diagnosis present

## 2019-04-18 DIAGNOSIS — Z87891 Personal history of nicotine dependence: Secondary | ICD-10-CM | POA: Diagnosis not present

## 2019-04-18 DIAGNOSIS — Z20822 Contact with and (suspected) exposure to covid-19: Secondary | ICD-10-CM | POA: Diagnosis present

## 2019-04-18 DIAGNOSIS — E441 Mild protein-calorie malnutrition: Secondary | ICD-10-CM | POA: Diagnosis present

## 2019-04-18 DIAGNOSIS — Z515 Encounter for palliative care: Secondary | ICD-10-CM | POA: Diagnosis not present

## 2019-04-18 DIAGNOSIS — I472 Ventricular tachycardia: Secondary | ICD-10-CM | POA: Diagnosis not present

## 2019-04-18 DIAGNOSIS — E870 Hyperosmolality and hypernatremia: Secondary | ICD-10-CM | POA: Diagnosis present

## 2019-04-18 DIAGNOSIS — R531 Weakness: Secondary | ICD-10-CM | POA: Diagnosis present

## 2019-04-18 DIAGNOSIS — Z66 Do not resuscitate: Secondary | ICD-10-CM | POA: Diagnosis not present

## 2019-04-18 DIAGNOSIS — Z823 Family history of stroke: Secondary | ICD-10-CM | POA: Diagnosis not present

## 2019-04-18 DIAGNOSIS — Z79899 Other long term (current) drug therapy: Secondary | ICD-10-CM | POA: Diagnosis not present

## 2019-04-18 DIAGNOSIS — R29722 NIHSS score 22: Secondary | ICD-10-CM | POA: Diagnosis present

## 2019-04-18 LAB — HIV ANTIBODY (ROUTINE TESTING W REFLEX): HIV Screen 4th Generation wRfx: NONREACTIVE

## 2019-04-18 LAB — HEMOGLOBIN A1C
Hgb A1c MFr Bld: 5.9 % — ABNORMAL HIGH (ref 4.8–5.6)
Mean Plasma Glucose: 122.63 mg/dL

## 2019-04-18 LAB — SODIUM
Sodium: 144 mmol/L (ref 135–145)
Sodium: 148 mmol/L — ABNORMAL HIGH (ref 135–145)
Sodium: 154 mmol/L — ABNORMAL HIGH (ref 135–145)
Sodium: 159 mmol/L — ABNORMAL HIGH (ref 135–145)

## 2019-04-18 LAB — RESPIRATORY PANEL BY RT PCR (FLU A&B, COVID)
Influenza A by PCR: NEGATIVE
Influenza B by PCR: NEGATIVE
SARS Coronavirus 2 by RT PCR: NEGATIVE

## 2019-04-18 LAB — ECHOCARDIOGRAM COMPLETE
Height: 66 in
Weight: 2472.68 oz

## 2019-04-18 LAB — LACTIC ACID, PLASMA: Lactic Acid, Venous: 1.8 mmol/L (ref 0.5–1.9)

## 2019-04-18 LAB — APTT: aPTT: 20 seconds — ABNORMAL LOW (ref 24–36)

## 2019-04-18 LAB — LIPID PANEL
Cholesterol: 140 mg/dL (ref 0–200)
HDL: 45 mg/dL (ref >40)
LDL Cholesterol: 77 mg/dL (ref 0–99)
Total CHOL/HDL Ratio: 3.1 RATIO
Triglycerides: 88 mg/dL (ref <150)
VLDL: 18 mg/dL (ref 0–40)

## 2019-04-18 LAB — MRSA PCR SCREENING: MRSA by PCR: NEGATIVE

## 2019-04-18 LAB — TROPONIN I (HIGH SENSITIVITY)
Troponin I (High Sensitivity): 32 ng/L — ABNORMAL HIGH (ref <18)
Troponin I (High Sensitivity): 36 ng/L — ABNORMAL HIGH (ref <18)

## 2019-04-18 MED ORDER — ACETAMINOPHEN 160 MG/5ML PO SOLN: 650.0000 mg | ORAL | Status: DC | PRN

## 2019-04-18 MED ORDER — ACETAMINOPHEN 650 MG RE SUPP: 650.0000 mg | RECTAL | Status: DC | PRN

## 2019-04-18 MED ORDER — ASPIRIN 300 MG RE SUPP
300.0000 mg | Freq: Every day | RECTAL | Status: DC
  Administered 2019-04-18 – 2019-04-20 (×3): 300 mg via RECTAL
  Filled 2019-04-18 (×3): qty 1

## 2019-04-18 MED ORDER — ORAL CARE MOUTH RINSE
15.0000 mL | Freq: Two times a day (BID) | OROMUCOSAL | Status: DC
  Administered 2019-04-18 – 2019-04-23 (×10): 15 mL via OROMUCOSAL

## 2019-04-18 MED ORDER — SENNOSIDES-DOCUSATE SODIUM 8.6-50 MG PO TABS: 1.0000 | ORAL_TABLET | Freq: Every evening | ORAL | Status: DC | PRN

## 2019-04-18 MED ORDER — IOHEXOL 350 MG/ML SOLN
75.0000 mL | Freq: Once | INTRAVENOUS | Status: AC | PRN
  Administered 2019-04-18: 75 mL via INTRAVENOUS

## 2019-04-18 MED ORDER — ENOXAPARIN SODIUM 40 MG/0.4ML ~~LOC~~ SOLN
40.0000 mg | SUBCUTANEOUS | Status: DC
  Administered 2019-04-18 – 2019-04-21 (×4): 40 mg via SUBCUTANEOUS
  Filled 2019-04-18 (×4): qty 0.4

## 2019-04-18 MED ORDER — SODIUM CHLORIDE 3 % IV SOLN
INTRAVENOUS | Status: DC
  Administered 2019-04-18: 75 mL/h via INTRAVENOUS
  Filled 2019-04-18 (×4): qty 500

## 2019-04-18 MED ORDER — STROKE: EARLY STAGES OF RECOVERY BOOK
Freq: Once | Status: AC
  Filled 2019-04-18: qty 1

## 2019-04-18 MED ORDER — CHLORHEXIDINE GLUCONATE 0.12 % MT SOLN
15.0000 mL | Freq: Two times a day (BID) | OROMUCOSAL | Status: DC
  Administered 2019-04-18 – 2019-04-23 (×9): 15 mL via OROMUCOSAL
  Filled 2019-04-18 (×8): qty 15

## 2019-04-18 MED ORDER — SODIUM CHLORIDE 0.9 % IV SOLN: INTRAVENOUS | Status: DC

## 2019-04-18 MED ORDER — CHLORHEXIDINE GLUCONATE CLOTH 2 % EX PADS
6.0000 | MEDICATED_PAD | Freq: Every day | CUTANEOUS | Status: DC
  Administered 2019-04-18 – 2019-04-21 (×4): 6 via TOPICAL

## 2019-04-18 MED ORDER — LABETALOL HCL 5 MG/ML IV SOLN
10.0000 mg | INTRAVENOUS | Status: DC | PRN
  Administered 2019-04-18 – 2019-04-20 (×7): 20 mg via INTRAVENOUS
  Administered 2019-04-20: 10 mg via INTRAVENOUS
  Administered 2019-04-20: 20 mg via INTRAVENOUS
  Filled 2019-04-18 (×9): qty 4

## 2019-04-18 MED ORDER — ACETAMINOPHEN 325 MG PO TABS: 650.0000 mg | ORAL_TABLET | ORAL | Status: DC | PRN

## 2019-04-18 NOTE — Progress Notes (Signed)
Physical Therapy Treatment Patient Details Name: Angela Alvarez MRN: [id number] DOB: [birth year] Today's Date: 04/18/2019    History of Present Illness Pt is a 64 yo female s/p L side weakness with severe dysarthria, L hemineglect and anosognosia.   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    PT Comments    Pt sliding down and out of the chair, so assisted return to bed for safety reasons.  Emphasis on standing, transfers and sitting balance EOB, with upright posture, inhibiting resistance of the R UE.    Follow Up Recommendations  CIR;Supervision/Assistance - 24 hour     Equipment Recommendations  Other (comment)(TBA)    Recommendations for Other Services Rehab consult     Precautions / Restrictions Precautions Precautions: Fall;Other (comment) Precaution Comments: L hemiplegia, L neglect    Mobility  Bed Mobility Overal bed mobility: Needs Assistance Bed Mobility: Sit to Supine     Supine to sit: Max assist Sit to supine: Max assist;+2 for physical assistance   General bed mobility comments: cues for direction and assist to lay down against pt's resistance  Transfers Overall transfer level: Needs assistance Equipment used: None Transfers: Sit to/from [facility] Sit to Stand: Max assist;+2 physical assistance;+2 safety/equipment Stand pivot transfers: Max assist;+2 physical assistance;+2 safety/equipment       General transfer comment: cues for hand placement and posture set up, but pt unable to follow and hold positionin.  face to face assist to come forward and stand/ transfer.  pt unable to support weight on the L LE automatically, but trying decreased awareness of L LE deficits.    Ambulation/Gait             General Gait Details: unable   Stairs             Wheelchair Mobility    Modified Rankin (Stroke Patients Only) Modified Rankin (Stroke Patients  Only) Pre-Morbid Rankin Score: No symptoms Modified Rankin: Severe disability     Balance Overall balance assessment: Needs assistance Sitting-balance support: Single extremity supported;No upper extremity supported;Feet supported Sitting balance-Leahy Scale: Poor Sitting balance - Comments: worked more on finding and holding midline while gown and leads changed for urinary incontinence.   Standing balance support: Single extremity supported;During functional activity Standing balance-Leahy Scale: Poor Standing balance comment: full balance support on L LE, assist coordination of R side assist.                            Cognition Arousal/Alertness: Awake/alert Behavior During Therapy: Restless;Flat affect Overall Cognitive Status: Impaired/Different from baseline Area of Impairment: Orientation;Attention;Following commands;Safety/judgement;Awareness;Problem solving                 Orientation Level: Situation;Time Current Attention Level: Focused;Sustained   Following Commands: Follows one step commands inconsistently;Follows one step commands with increased time Safety/Judgement: Decreased awareness of safety;Decreased awareness of deficits Awareness: Intellectual Problem Solving: Slow processing;Decreased initiation;Difficulty sequencing;Requires verbal cues;Requires tactile cues        Exercises Other Exercises Other Exercises: warm up LE ROM prior to mobility.    General Comments General comments (skin integrity, edema, etc.): vss      Pertinent Vitals/Pain Pain Assessment: Faces Pain Score: 4  Faces Pain Scale: Hurts little more Pain Location: R foot Pain Descriptors / Indicators: Discomfort Pain Intervention(s): Monitored during session    Home Living Family/patient expects to be discharged  to:: Private residence Living Arrangements: Alone Available Help at Discharge: Family;Available PRN/intermittently Type of Home: House Home Access:  Level entry   Home Layout: Two level   Additional Comments: Pt reports having "a guy that comes 3 hours daily."    Prior Function Level of Independence: Independent      Comments: working, driving, family brings groceries or they deliver   PT Goals (current goals can now be found in the care plan section) Acute Rehab PT Goals Patient Stated Goal: I want to drink PT Goal Formulation: With patient Time For Goal Achievement: 05/02/19 Potential to Achieve Goals: Fair Progress towards PT goals: Progressing toward goals    Frequency    Min 3X/week      PT Plan Current plan remains appropriate    Co-evaluation              AM-PAC PT "6 Clicks" Mobility   Outcome Measure  Help needed turning from your back to your side while in a flat bed without using bedrails?: Total Help needed moving from lying on your back to sitting on the side of a flat bed without using bedrails?: Total Help needed moving to and from a bed to a chair (including a wheelchair)?: Total Help needed standing up from a chair using your arms (e.g., wheelchair or bedside chair)?: Total Help needed to walk in hospital room?: Total Help needed climbing 3-5 steps with a railing? : Total 6 Click Score: 6    End of Session   Activity Tolerance: Patient tolerated treatment well Patient left: in bed;with call bell/phone within reach;with bed alarm set;with nursing/sitter in room Nurse Communication: Mobility status PT Visit Diagnosis: Other abnormalities of gait and mobility (R26.89);Difficulty in walking, not elsewhere classified (R26.2);Hemiplegia and hemiparesis Hemiplegia - Right/Left: Left Hemiplegia - dominant/non-dominant: Non-dominant Hemiplegia - caused by: Cerebral infarction     Time: 1157-1210 PT Time Calculation (min) (ACUTE ONLY): 13 min  Charges:  $Therapeutic Activity: 8-22 mins                     04/18/2019  Jacinto Halim., PT Acute Rehabilitation Services [phone]   (pager) [phone]  (office)   Angela Alvarez Want 04/18/2019, 12:16 PM

## 2019-04-18 NOTE — Progress Notes (Signed)
*  PRELIMINARY RESULTS* Echocardiogram 2D Echocardiogram has been performed.  Jeryl Columbia 04/18/2019, 10:09 AM

## 2019-04-18 NOTE — ED Notes (Signed)
ED TO INPATIENT HANDOFF REPORT  ED Nurse Name and Phone #: Lewanda Rife [phone]  S Name/Age/Gender Angela Alvarez 64 y.o. female Room/Bed: 032C/032C  Code Status   Code Status: Full Code  Home/SNF/Other Unknown Patient oriented to: self Is this baseline? No   Triage Complete: Triage complete  Chief Complaint Acute ischemic stroke [facility]) [I63.9]  Triage Note Patient arrived by EMS from home for a welfare check because she had not been seen by family since Saturday 04-14-19. On scene, pt presented with stroke symptoms: L sided weakness, aphasia. Pt found in prone position, possible fall, no signs of trauma. Pressure ulcer noted on right abdomen.     Allergies Allergies  Allergen Reactions  . Amoxicillin-Pot Clavulanate Nausea And Vomiting    Did it involve swelling of the face/tongue/throat, SOB, or low BP? No Did it involve sudden or severe rash/hives, skin peeling, or any reaction on the inside of your mouth or nose? No Did you need to seek medical attention at a hospital or doctor's office? No When did it last happen?3 years If all above answers are "NO", may proceed with cephalosporin use.   . Levofloxacin Nausea And Vomiting    Level of Care/Admitting Diagnosis ED Disposition    ED Disposition Condition Comment   Admit  Hospital Area: [facility] [[id number]]  Level of Care: ICU [6]  Covid Evaluation: Asymptomatic Screening Protocol (No Symptoms)  Diagnosis: Acute ischemic stroke [facility]) [[id number]]  Admitting Physician: Milon Dikes [[id number]]  Attending Physician: Milon Dikes [[id number]]  Estimated length of stay: 5 - 7 days  Certification:: I certify this patient will need inpatient services for at least 2 midnights  Bed request comments: 4N       B Medical/Surgery History Past Medical History:  Diagnosis Date  . Abnormal Pap smear of cervix 06-01-13   ascus pap:pos.HR HPV/hx of cryotherapy to cx 1985  . Arthritis    knee  . Asthma   .  COPD (chronic obstructive pulmonary disease) (HCC)   . Critical lower limb ischemia    ischemic appearing left first toe  . Headache    MMigraines  . Peripheral vascular disease [facility])    Past Surgical History:  Procedure Laterality Date  . ABDOMINAL AORTOGRAM W/LOWER EXTREMITY Bilateral 09/11/2018   Procedure: ABDOMINAL AORTOGRAM W/LOWER EXTREMITY;  Surgeon: Maeola Harman, MD;  Location: [facility] INVASIVE CV LAB;  Service: Cardiovascular;  Laterality: Bilateral;  . AMPUTATION Right 05/19/2015   Procedure: RIGHT FIRST RAY AMPUTATION;  Surgeon: Sherren Kerns, MD;  Location: [facility] OR;  Service: Vascular;  Laterality: Right;  . AMPUTATION Right 02/28/2019   Procedure: AMPUTATION DIGIT - RIGHT FOURTH TOE;  Surgeon: Maeola Harman, MD;  Location: [facility] OR;  Service: Vascular;  Laterality: Right;  . BREAST SURGERY Right    cyst  . BUNIONECTOMY Bilateral    bilateral  . CARDIAC CATHETERIZATION Bilateral    Catarct  . COLONOSCOPY W/ POLYPECTOMY    . KNEE ARTHROPLASTY Right 07/21/2017  . PERIPHERAL VASCULAR CATHETERIZATION N/A 04/28/2015   Procedure: Lower Extremity Angiography;  Surgeon: Runell Gess, MD;  Location: [facility] INVASIVE CV LAB;  Service: Cardiovascular;  Laterality: N/A;  . PERIPHERAL VASCULAR INTERVENTION Left 09/11/2018   Procedure: PERIPHERAL VASCULAR INTERVENTION;  Surgeon: Maeola Harman, MD;  Location: [facility] INVASIVE CV LAB;  Service: Cardiovascular;  Laterality: Left;  SFA stent  . surgery under arm Right    --benign breast tissue     A IV Location/Drains/Wounds Patient Lines/Drains/Airways Status   Active  Line/Drains/Airways    Name:   Placement date:   Placement time:   Site:   Days:   Peripheral IV 04/17/19 Right Antecubital   04/17/19    2331    Antecubital   1   Incision (Closed) 05/19/15 Foot Right   05/19/15    1443     1430   Incision (Closed) 02/28/19 Leg Right   02/28/19    1226     49          Intake/Output Last 24 hours No intake or  output data in the 24 hours ending 04/18/19 0044  Labs/Imaging Results for orders placed or performed during the hospital encounter of 04/17/19 (from the past 48 hour(s))  Ethanol     Status: None   Collection Time: 04/17/19 10:04 PM  Result Value Ref Range   Alcohol, Ethyl (B) <10 <10 mg/dL    Comment: (NOTE) Lowest detectable limit for serum alcohol is 10 mg/dL. For medical purposes only. Performed at [facility], [address], [place], [place] [address]   Protime-INR     Status: None   Collection Time: 04/17/19 10:04 PM  Result Value Ref Range   Prothrombin Time 14.1 11.4 - 15.2 seconds   INR 1.1 0.8 - 1.2    Comment: (NOTE) INR goal varies based on device and disease states. Performed at [facility], [address], [place], [place] [address]   APTT     Status: Abnormal   Collection Time: 04/17/19 10:04 PM  Result Value Ref Range   aPTT 20 (L) 24 - 36 seconds    Comment: Performed at [facility], [address] [place], [place] [address]  CBC     Status: Abnormal   Collection Time: 04/17/19 10:04 PM  Result Value Ref Range   WBC 12.7 (H) 4.0 - 10.5 K/uL   RBC 5.00 3.87 - 5.11 MIL/uL   Hemoglobin 13.5 12.0 - 15.0 g/dL   HCT 70.3 50.0 - 93.8 %   MCV 87.4 80.0 - 100.0 fL   MCH 27.0 26.0 - 34.0 pg   MCHC 30.9 30.0 - 36.0 g/dL   RDW 18.2 99.3 - 71.6 %   Platelets 422 (H) 150 - 400 K/uL   nRBC 0.0 0.0 - 0.2 %    Comment: Performed at [facility], [address] [place], [place] [address]  Differential     Status: Abnormal   Collection Time: 04/17/19 10:04 PM  Result Value Ref Range   Neutrophils Relative % 79 %   Neutro Abs 10.1 (H) 1.7 - 7.7 K/uL   Lymphocytes Relative 10 %   Lymphs Abs 1.3 0.7 - 4.0 K/uL   Monocytes Relative 9 %   Monocytes Absolute 1.1 (H) 0.1 - 1.0 K/uL   Eosinophils Relative 0 %   Eosinophils Absolute 0.0 0.0 - 0.5 K/uL   Basophils Relative 1 %   Basophils Absolute 0.1 0.0 - 0.1 K/uL   Immature Granulocytes 1  %   Abs Immature Granulocytes 0.06 0.00 - 0.07 K/uL    Comment: Performed at [facility], [address], [place], [place] [address]  Comprehensive metabolic panel     Status: Abnormal   Collection Time: 04/17/19 10:04 PM  Result Value Ref Range   Sodium 141 135 - 145 mmol/L   Potassium 3.8 3.5 - 5.1 mmol/L   Chloride 106 98 - 111 mmol/L   CO2 24 22 - 32 mmol/L  Glucose, Bld 127 (H) 70 - 99 mg/dL   BUN 25 (H) 8 - 23 mg/dL   Creatinine, Ser 7.59 0.44 - 1.00 mg/dL   Calcium 8.9 8.9 - 16.3 mg/dL   Total Protein 7.2 6.5 - 8.1 g/dL   Albumin 3.4 (L) 3.5 - 5.0 g/dL   AST 30 15 - 41 U/L   ALT 28 0 - 44 U/L   Alkaline Phosphatase 73 38 - 126 U/L   Total Bilirubin 1.1 0.3 - 1.2 mg/dL   GFR calc non Af Amer >60 >60 mL/min   GFR calc Af Amer >60 >60 mL/min   Anion gap 11 5 - 15    Comment: Performed at [facility], [address] [place], [place] [address]  CK     Status: Abnormal   Collection Time: 04/17/19 10:04 PM  Result Value Ref Range   Total CK 312 (H) 38 - 234 U/L    Comment: Performed at [facility], [address] [place], [place] [address]  CBG monitoring, ED     Status: Abnormal   Collection Time: 04/17/19 10:10 PM  Result Value Ref Range   Glucose-Capillary 121 (H) 70 - 99 mg/dL   CT Head Wo Contrast  Result Date: 04/17/2019 CLINICAL DATA:  64 year old female with focal neurologic deficit. EXAM: CT HEAD WITHOUT CONTRAST TECHNIQUE: Contiguous axial images were obtained from the base of the skull through the vertex without intravenous contrast. COMPARISON:  None. FINDINGS: Brain: There is a large area of low attenuation with loss of gray-white matter discrimination involving the right MCA territory consistent with an acute or subacute infarct. There is associated edema and overall increased volume with associated mass effect and compression of the right cerebral sulci and right lateral ventricle. There is approximately 5 mm right to left midline shift.  There is no acute intracranial hemorrhage. Vascular: Large right MCA clot measuring approximately 6 mm. There is probable occlusion of the right supraclinoid ICA. Further evaluation with CT angiography recommended. Skull: Normal. Negative for fracture or focal lesion. Sinuses/Orbits: The visualized paranasal sinuses and mastoid air cells are clear. Other: None IMPRESSION: Occlusion of the right MCA and possibly portions of the right supraclinoid ICA with a large area of right MCA territory infarct, likely acute or subacute. There is associated mass effect and approximately 5 mm right to left midline shift. No acute intracranial hemorrhage. These results were called by telephone at the time of interpretation on 04/17/2019 at 11:06 pm to Dr. Webb Silversmith who verbally acknowledged these results. Electronically Signed   By: Elgie Collard M.D.   On: 04/17/2019 23:11   CT Cervical Spine Wo Contrast  Result Date: 04/17/2019 CLINICAL DATA:  Possible fall EXAM: CT CERVICAL SPINE WITHOUT CONTRAST TECHNIQUE: Multidetector CT imaging of the cervical spine was performed without intravenous contrast. Multiplanar CT image reconstructions were also generated. COMPARISON:  None. FINDINGS: Alignment: No subluxation Skull base and vertebrae: No acute fracture. No primary bone lesion or focal pathologic process. Soft tissues and spinal canal: No prevertebral fluid or swelling. No visible canal hematoma. Disc levels: Degenerative disc disease from C4-5 through C6-7. Diffuse degenerative facet disease. Upper chest: No acute findings Other: Carotid artery calcifications. IMPRESSION: Degenerative disc and facet disease.  No acute bony abnormality. Electronically Signed   By: Charlett Nose M.D.   On: 04/17/2019 22:57    Pending Labs Unresulted Labs (From admission, onward)    Start     Ordered   04/18/19 0500  Hemoglobin A1c  Tomorrow morning,   R     04/18/19 0025   04/18/19 0500  Lipid panel  Tomorrow morning,   R    Comments:  Fasting    04/18/19 0025   04/18/19 0026  Sodium  Now then every 6 hours,   R (with STAT occurrences)     04/18/19 0026   04/18/19 0023  HIV Antibody (routine testing w rflx)  (HIV Antibody (Routine testing w reflex) panel)  Once,   STAT     04/18/19 0025   04/17/19 2306  Respiratory Panel by RT PCR (Flu A&B, Covid) - Nasopharyngeal Swab  (Asymptomatic Respiratory Panel by RT PCR (Flu A&B, Covid))  Once,   STAT    Question Answer Comment  Is this test for diagnosis or screening Screening   Symptomatic for COVID-19 as defined by CDC No   Hospitalized for COVID-19 No   Admitted to ICU for COVID-19 No   Previously tested for COVID-19 Yes   Resident in a congregate (group) care setting No   Employed in healthcare setting No   Pregnant No      04/17/19 2305   04/17/19 2206  Culture, blood (routine x 2)  BLOOD CULTURE X 2,   STAT     04/17/19 2205   04/17/19 2205  Lactic acid, plasma  Now then every 2 hours,   STAT     04/17/19 2205   04/17/19 2204  Urine rapid drug screen (hosp performed)  ONCE - STAT,   STAT     04/17/19 2205   04/17/19 2204  Urinalysis, Routine w reflex microscopic  ONCE - STAT,   STAT     04/17/19 2205          Vitals/Pain Today's Vitals   04/17/19 2315 04/17/19 2330 04/17/19 2345 04/18/19 0000  BP: (!) 177/69 (!) 189/76 (!) 185/87 (!) 159/65  Pulse:    97  Resp: (!) 22 (!) 28 (!) 27 17  Temp:      TempSrc:      SpO2:    98%  PainSc:        Isolation Precautions No active isolations  Medications Medications   stroke: mapping our early stages of recovery book (has no administration in time range)  acetaminophen (TYLENOL) tablet 650 mg (has no administration in time range)    Or  acetaminophen (TYLENOL) 160 MG/5ML solution 650 mg (has no administration in time range)    Or  acetaminophen (TYLENOL) suppository 650 mg (has no administration in time range)  senna-docusate (Senokot-S) tablet 1 tablet (has no administration in time range)  sodium  chloride (hypertonic) 3 % solution (has no administration in time range)  iohexol (OMNIPAQUE) 350 MG/ML injection 75 mL (75 mLs Intravenous Contrast Given 04/18/19 0013)    Mobility non-ambulatory High fall risk   Focused Assessments Neuro Assessment Handoff:  Swallow screen pass? No          Neuro Assessment:   Neuro Checks:      Last Documented NIHSS Modified Score:   Has TPA been given? No If patient is a Neuro Trauma and patient is going to OR before floor call report to [place] nurse: [phone] or [phone]     R Recommendations: See Admitting Provider Note  Report given to:   Additional Notes: Brain bleed. Patient is able to communicate but is dysphasic.

## 2019-04-18 NOTE — Evaluation (Signed)
Occupational Therapy Evaluation Patient Details Name: Angela Alvarez MRN: [id number] DOB: Sep 03, 1955 Today's Date: 04/18/2019    History of Present Illness Pt is a 64 yo female s/p L side weakness with severe dysarthria, L hemineglect and anosognosia.   MRI:  R MCA infarct, R occlusion in R MCA and some of ICA. 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   Clinical Impression   Pt PTA: Pt will independent with ADL and mobility. Pt currently performing 50-75% of commands, but often required redirection to complete task due to very poor attention. Pt limited by poor cognitive status, inability to follow all commands, decreased strength on L side and L inattention. Pt unable to keep eyes open and unable to properly perform vision assessment. Pt with LUE flaccidity and withdrawals minimally from pain. Pt maxA to totalA for ADL. Pt using RUE to wash face, but unable to keep self from falling forward or to L without constant cues and modA. Pt would benefit from continued OT skilled services for ADL, mobility and LUE HEP. Pt appears motivated to return to PLOF and wants to be independent again. OT following acutely.     Follow Up Recommendations  CIR;Supervision/Assistance - 24 hour    Equipment Recommendations  Other (comment)(to be determined)    Recommendations for Other Services Rehab consult     Precautions / Restrictions Precautions Precautions: Fall;Other (comment) Precaution Comments: L hemiplegia, L neglect      Mobility Bed Mobility Overal bed mobility: Needs Assistance Bed Mobility: Sit to Supine     Supine to sit: Max assist Sit to supine: Max assist;+2 for physical assistance   General bed mobility comments: assist for cues; may be helpful for +2 for L side flaccidity and to manage L arm  Transfers Overall transfer level: Needs assistance Equipment used: None Transfers: Sit to/from [employer] Sit to Stand: Max assist;+2 physical assistance;+2  safety/equipment Stand pivot transfers: Max assist;+2 physical assistance;+2 safety/equipment       General transfer comment: Cues for hand placement; L knee blocked for stand pivot. Pt appears to bear some weight on LLE.    Balance Overall balance assessment: Needs assistance Sitting-balance support: Single extremity supported;No upper extremity supported;Feet supported Sitting balance-Leahy Scale: Poor Sitting balance - Comments: worked more on finding and holding midline while gown and leads changed for urinary incontinence.   Standing balance support: Single extremity supported;During functional activity Standing balance-Leahy Scale: Poor Standing balance comment: full balance support on L LE, assist coordination of R side assist.                           ADL either performed or assessed with clinical judgement   ADL Overall ADL's : Needs assistance/impaired Eating/Feeding: NPO   Grooming: Moderate assistance;Sitting;Bed level   Upper Body Bathing: Maximal assistance;Sitting;Bed level   Lower Body Bathing: Maximal assistance;Total assistance;+2 for physical assistance;+2 for safety/equipment;Sitting/lateral leans;Bed level   Upper Body Dressing : Maximal assistance;Bed level;Cueing for sequencing   Lower Body Dressing: Maximal assistance;Total assistance;+2 for physical assistance;+2 for safety/equipment;Sitting/lateral leans;Sit to/from stand;Cueing for safety;Bed level   Toilet Transfer: Maximal assistance;+2 for physical assistance;+2 for safety/equipment;Stand-pivot Toilet Transfer Details (indicate cue type and reason): simulated to recliner Toileting- Clothing Manipulation and Hygiene: Maximal assistance;Total assistance;+2 for physical assistance;+2 for safety/equipment;Sitting/lateral lean;Sit to/from stand;Cueing for safety       Functional mobility during ADLs: Maximal assistance;Total assistance;+2 for physical assistance;+2 for  safety/equipment;Rolling walker;Cueing for sequencing General ADL Comments: Pt  limited by poor cognitive status, inability to follow all commands, decreased strength on L side and L inattention.     Vision Baseline Vision/History: Wears glasses Wears Glasses: Reading only Patient Visual Report: No change from baseline Vision Assessment?: Vision impaired- to be further tested in functional context Additional Comments: Pt with R gaze preference; unable to decipher colors at this time. Pt required attention to name to follow commands     Perception     Praxis      Pertinent Vitals/Pain Pain Assessment: Faces Faces Pain Scale: No hurt Pain Location: R foot Pain Descriptors / Indicators: Discomfort Pain Intervention(s): Monitored during session;Other (comment)(no bruising noted)     Hand Dominance Right   Extremity/Trunk Assessment Upper Extremity Assessment Upper Extremity Assessment: Generalized weakness;LUE deficits/detail LUE Deficits / Details: Flaccid. Pt response to pain for shoulder elevation and extension to nail bed pressure. PROM, WFLs.   Lower Extremity Assessment Lower Extremity Assessment: LLE deficits/detail LLE Deficits / Details: near flaccid LLE Coordination: decreased fine motor;decreased gross motor       Communication Communication Communication: Expressive difficulties;Other (comment)(difficult to understand)   Cognition Arousal/Alertness: Awake/alert Behavior During Therapy: Restless;Flat affect Overall Cognitive Status: Impaired/Different from baseline Area of Impairment: Orientation;Attention;Following commands;Safety/judgement;Awareness;Problem solving                 Orientation Level: Situation;Time Current Attention Level: Focused;Sustained   Following Commands: Follows one step commands inconsistently;Follows one step commands with increased time Safety/Judgement: Decreased awareness of safety;Decreased awareness of deficits Awareness:  Intellectual Problem Solving: Slow processing;Decreased initiation;Difficulty sequencing;Requires verbal cues;Requires tactile cues General Comments: Pt performing 50-75% of commands, but often required redirection to complete task due to very poor attention.   General Comments  VSS. Pt with Anosognosia at this time per neuro. (complete denial of symptoms)    Exercises Exercises: Other exercises Other Exercises Other Exercises: PROM to LUE shoulder through digits; WFLs.   Shoulder Instructions      Home Living Family/patient expects to be discharged to:: Private residence Living Arrangements: Alone Available Help at Discharge: Family;Friend(s) Type of Home: House Home Access: Level entry     Home Layout: Two level Alternate Level Stairs-Number of Steps: 16   Bathroom Shower/Tub: Tub/shower unit;Walk-in shower   Bathroom Toilet: Handicapped height         Additional Comments: Pt reports having "a guy that comes 3 hours daily."  Lives With: Alone    Prior Functioning/Environment Level of Independence: Independent        Comments: working, driving, family brings groceries or they deliver        OT Problem List: Decreased strength;Decreased activity tolerance;Impaired balance (sitting and/or standing);Decreased safety awareness;Decreased cognition;Decreased coordination;Impaired vision/perception;Impaired UE functional use;Pain;Increased edema;Impaired tone      OT Treatment/Interventions: Self-care/ADL training;Therapeutic exercise;Neuromuscular education;Energy conservation;Therapeutic activities;Patient/family education;Balance training;Visual/perceptual remediation/compensation;Cognitive remediation/compensation    OT Goals(Current goals can be found in the care plan section) Acute Rehab OT Goals Patient Stated Goal: I want water OT Goal Formulation: Patient unable to participate in goal setting Time For Goal Achievement: 05/02/19 Potential to Achieve Goals:  Good ADL Goals Pt Will Perform Grooming: with supervision;sitting Pt Will Perform Lower Body Dressing: with mod assist;sitting/lateral leans;bed level Pt Will Transfer to Toilet: with mod assist;stand pivot transfer;bedside commode Pt/caregiver will Perform Home Exercise Program: Increased strength;Increased ROM;Right Upper extremity;With written HEP provided Additional ADL Goal #1: Pt will perform visual scanning compensatory strategies as directed in 3/5 trials with 90% accuracy Additional ADL Goal #2: Pt will follow 2 step commands  with minimal verbal cueing.  OT Frequency: Min 2X/week   Barriers to D/C:            Co-evaluation PT/OT/SLP Co-Evaluation/Treatment: Yes Reason for Co-Treatment: Complexity of the patient's impairments (multi-system involvement);To address functional/ADL transfers;Necessary to address cognition/behavior during functional activity   OT goals addressed during session: ADL's and self-care      AM-PAC OT "6 Clicks" Daily Activity     Outcome Measure Help from another person eating meals?: Total Help from another person taking care of personal grooming?: A Lot Help from another person toileting, which includes using toliet, bedpan, or urinal?: Total Help from another person bathing (including washing, rinsing, drying)?: Total Help from another person to put on and taking off regular upper body clothing?: A Lot Help from another person to put on and taking off regular lower body clothing?: Total 6 Click Score: 8   End of Session Nurse Communication: Mobility status  Activity Tolerance: Patient tolerated treatment well;Patient limited by lethargy;Treatment limited secondary to medical complications (Comment) Patient left: in chair;with call bell/phone within reach;with chair alarm set  OT Visit Diagnosis: Unsteadiness on feet (R26.81);Muscle weakness (generalized) (M62.81)                Time: 2683-4196 OT Time Calculation (min): 31 min Charges:  OT  General Charges $OT Visit: 1 Visit OT Evaluation $OT Eval Moderate Complexity: 1 Mod  Flora Lipps OTR/L Acute Rehabilitation Services Pager: [phone] Office: [phone]  Vassie Kugel C 04/18/2019, 3:49 PM

## 2019-04-18 NOTE — Evaluation (Signed)
Physical Therapy Evaluation Patient Details Name: Angela Alvarez MRN: [id number] DOB: 03/17/1956 Today's Date: 04/18/2019   History of Present Illness  Pt is a 64 yo female s/p L side weakness with severe dysarthria, L hemineglect and anosognosia.   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  Clinical Impression  Pt admitted with/for s/s of stroke with L side weakness/neglect.  Pt needing maximal assist of 2 for safety for basic mobility.  Pt currently limited functionally due to the problems listed. ( See problems list.)   Pt will benefit from PT to maximize function and safety in order to get ready for next venue listed below.     Follow Up Recommendations CIR;Supervision/Assistance - 24 hour    Equipment Recommendations  Other (comment)(TBA next venue)    Recommendations for Other Services Rehab consult     Precautions / Restrictions Precautions Precautions: Fall;Other (comment) Precaution Comments: L hemiplegia Restrictions Weight Bearing Restrictions: No      Mobility  Bed Mobility Overal bed mobility: Needs Assistance Bed Mobility: Supine to Sit     Supine to sit: Max assist     General bed mobility comments: truncal assist and direction.  hand over hand direction of R UE to assist.  pt needed maximal assist to coordinate scoot to the EOB  Transfers Overall transfer level: Needs assistance Equipment used: None Transfers: Sit to/from [employer] Transfers Sit to Stand: Max assist;+2 physical assistance;+2 safety/equipment Stand pivot transfers: Max assist;+2 physical assistance;+2 safety/equipment       General transfer comment: cues for hand placement and posture set up, but pt unable to follow and hold positionin.  face to face assist to come forward and stand/ transfer.  pt unable to support weight on the L LE automatically, but trying decreased awareness of L LE deficits.     Ambulation/Gait             General Gait Details: unable  Stairs            Wheelchair Mobility    Modified Rankin (Stroke Patients Only) Modified Rankin (Stroke Patients Only) Pre-Morbid Rankin Score: No symptoms Modified Rankin: Severe disability     Balance Overall balance assessment: Needs assistance Sitting-balance support: Single extremity supported;No upper extremity supported;Feet supported Sitting balance-Leahy Scale: Poor Sitting balance - Comments: pt unable to find midline or maintain without significant assist.  Pt either lists L and/or forward, head down, R LE resistant to correction.  Spent 5 minutes working on helping pt find/maintain/acknowledge midline.   Standing balance support: Single extremity supported;During functional activity Standing balance-Leahy Scale: Poor Standing balance comment: full balance support on L LE, assist coordination of R side assist.                             Pertinent Vitals/Pain Pain Assessment: Faces Pain Score: 4  Faces Pain Scale: Hurts little more Pain Location: R foot Pain Descriptors / Indicators: Discomfort Pain Intervention(s): Repositioned    Home Living Family/patient expects to be discharged to:: Private residence Living Arrangements: Alone Available Help at Discharge: Family;Available PRN/intermittently Type of Home: House Home Access: Level entry     Home Layout: Two level   Additional Comments: Pt reports having "a guy that comes 3 hours daily."    Prior Function Level of Independence: Independent         Comments: working, driving, family brings  groceries or they deliver     Hand Dominance   Dominant Hand: Right    Extremity/Trunk Assessment   Upper Extremity Assessment Upper Extremity Assessment: Defer to OT evaluation    Lower Extremity Assessment Lower Extremity Assessment: LLE deficits/detail LLE Deficits / Details: Trace movement in association with  movement of R side, yawns etc.  No movement to command.  Movement to noxious stim. LLE Coordination: decreased gross motor;decreased fine motor       Communication   Communication: Expressive difficulties  Cognition Arousal/Alertness: Awake/alert Behavior During Therapy: Restless;Flat affect Overall Cognitive Status: Impaired/Different from baseline Area of Impairment: Orientation;Attention;Following commands;Safety/judgement;Awareness;Problem solving                 Orientation Level: Situation;Time Current Attention Level: Focused;Sustained   Following Commands: Follows one step commands inconsistently;Follows one step commands with increased time Safety/Judgement: Decreased awareness of safety;Decreased awareness of deficits Awareness: Intellectual Problem Solving: Slow processing;Decreased initiation;Difficulty sequencing;Requires verbal cues;Requires tactile cues        General Comments General comments (skin integrity, edema, etc.): vss    Exercises Other Exercises Other Exercises: warm up LE ROM prior to mobility.   Assessment/Plan    PT Assessment Patient needs continued PT services  PT Problem List Decreased strength;Decreased activity tolerance;Decreased balance;Decreased mobility;Decreased coordination;Decreased safety awareness;Impaired sensation;Pain       PT Treatment Interventions DME instruction;Gait training;Functional mobility training;Therapeutic activities;Balance training;Therapeutic exercise;Neuromuscular re-education;Patient/family education    PT Goals (Current goals can be found in the Care Plan section)  Acute Rehab PT Goals Patient Stated Goal: I want to drink PT Goal Formulation: With patient Time For Goal Achievement: 05/02/19 Potential to Achieve Goals: Fair    Frequency Min 3X/week   Barriers to discharge        Co-evaluation               AM-PAC PT "6 Clicks" Mobility  Outcome Measure Help needed turning from your  back to your side while in a flat bed without using bedrails?: Total Help needed moving from lying on your back to sitting on the side of a flat bed without using bedrails?: Total Help needed moving to and from a bed to a chair (including a wheelchair)?: Total Help needed standing up from a chair using your arms (e.g., wheelchair or bedside chair)?: Total Help needed to walk in hospital room?: Total Help needed climbing 3-5 steps with a railing? : Total 6 Click Score: 6    End of Session   Activity Tolerance: Patient tolerated treatment well Patient left: in chair;with call bell/phone within reach;with chair alarm set Nurse Communication: Mobility status PT Visit Diagnosis: Other abnormalities of gait and mobility (R26.89);Difficulty in walking, not elsewhere classified (R26.2);Hemiplegia and hemiparesis Hemiplegia - Right/Left: Left Hemiplegia - dominant/non-dominant: Non-dominant Hemiplegia - caused by: Cerebral infarction    Time: 1026-1056 PT Time Calculation (min) (ACUTE ONLY): 30 min   Charges:   PT Evaluation $PT Eval High Complexity: 1 High          04/18/2019  Jacinto Halim., PT Acute Rehabilitation Services [phone]  (pager) [phone]  (office)  Eliseo Gum Normand Damron 04/18/2019, 11:42 AM

## 2019-04-18 NOTE — Evaluation (Signed)
Clinical/Bedside Swallow Evaluation Patient Details  Name: Angela Alvarez MRN: [id number] Date of Birth: Oct 30, 1955  Today's Date: 04/18/2019 Time: SLP Start Time (ACUTE ONLY): 1310 SLP Stop Time (ACUTE ONLY): 1320 SLP Time Calculation (min) (ACUTE ONLY): 10 min  Past Medical History:  Past Medical History:  Diagnosis Date  . Abnormal Pap smear of cervix 06-01-13   ascus pap:pos.HR HPV/hx of cryotherapy to cx 1985  . Arthritis    knee  . Asthma   . COPD (chronic obstructive pulmonary disease) (HCC)   . Critical lower limb ischemia    ischemic appearing left first toe  . Headache    MMigraines  . Peripheral vascular disease [facility]    Past Surgical History:  Past Surgical History:  Procedure Laterality Date  . ABDOMINAL AORTOGRAM W/LOWER EXTREMITY Bilateral 09/11/2018   Procedure: ABDOMINAL AORTOGRAM W/LOWER EXTREMITY;  Surgeon: Maeola Harman, MD;  Location: [facility] INVASIVE CV LAB;  Service: Cardiovascular;  Laterality: Bilateral;  . AMPUTATION Right 05/19/2015   Procedure: RIGHT FIRST RAY AMPUTATION;  Surgeon: Sherren Kerns, MD;  Location: [facility] OR;  Service: Vascular;  Laterality: Right;  . AMPUTATION Right 02/28/2019   Procedure: AMPUTATION DIGIT - RIGHT FOURTH TOE;  Surgeon: Maeola Harman, MD;  Location: [facility] OR;  Service: Vascular;  Laterality: Right;  . BREAST SURGERY Right    cyst  . BUNIONECTOMY Bilateral    bilateral  . CARDIAC CATHETERIZATION Bilateral    Catarct  . COLONOSCOPY W/ POLYPECTOMY    . KNEE ARTHROPLASTY Right 07/21/2017  . PERIPHERAL VASCULAR CATHETERIZATION N/A 04/28/2015   Procedure: Lower Extremity Angiography;  Surgeon: Runell Gess, MD;  Location: [facility] INVASIVE CV LAB;  Service: Cardiovascular;  Laterality: N/A;  . PERIPHERAL VASCULAR INTERVENTION Left 09/11/2018   Procedure: PERIPHERAL VASCULAR INTERVENTION;  Surgeon: Maeola Harman, MD;  Location: [facility] INVASIVE CV LAB;  Service: Cardiovascular;  Laterality: Left;  SFA stent   . surgery under arm Right    --benign breast tissue   HPI:  Pt is a 64 y.o. female found down at home, presenting with L sided weakness and dysarthria. CT showed large R MCA territory infarct with 57mm midline shift; MRI pending. PMH: PVD s/p toe amputations, headaches, COPD    Assessment / Plan / Recommendation Clinical Impression  Pt has L sided weakness (CN VII) and inattention with reduced awareness of secretions and suspect residual thin liquid in her L buccal cavity. Immediate coughing is elicited with small sips of water, not improved with hand-over-hand assist for self-feeding or administration via spoon. Pt is very eager to have something to drink (asks repeatedly for ginger ale), and could likely benefit from a small amount of moisture to facilitate her xerostomia and thicker saliva. Recommend that she remain NPO except for a few single pieces of ice given by RN after oral care. Will f/u on next date for MBS.  SLP Visit Diagnosis: Dysphagia, oropharyngeal phase (R13.12)    Aspiration Risk  Moderate aspiration risk    Diet Recommendation NPO;Other (Comment)(can have a few single pieces of ice with RN after oral care)   Medication Administration: Via alternative means    Other  Recommendations Oral Care Recommendations: Oral care QID Other Recommendations: Have oral suction available   Follow up Recommendations Inpatient Rehab      Frequency and Duration min 2x/week  2 weeks       Prognosis Prognosis for Safe Diet Advancement: Good Barriers to Reach Goals: Cognitive deficits      Swallow Study  General HPI: Pt is a 64 y.o. female found down at home, presenting with L sided weakness and dysarthria. CT showed large R MCA territory infarct with 40mm midline shift; MRI pending. PMH: PVD s/p toe amputations, headaches, COPD  Type of Study: Bedside Swallow Evaluation Previous Swallow Assessment: none in chart Diet Prior to this Study: NPO Temperature Spikes Noted:  No Respiratory Status: Room air History of Recent Intubation: No Behavior/Cognition: Lethargic/Drowsy;Cooperative;Requires cueing Oral Cavity Assessment: Excessive secretions Oral Care Completed by SLP: Recent completion by staff Oral Cavity - Dentition: Adequate natural dentition Vision: (does not open eyes) Self-Feeding Abilities: Total assist Patient Positioning: Upright in bed(leans to R; needs repositioning) Baseline Vocal Quality: Normal;Other (comment)(but speech is dysarthric) Volitional Swallow: Able to elicit    Oral/Motor/Sensory Function Overall Oral Motor/Sensory Function: Moderate impairment Facial ROM: Reduced left;Suspected CN VII (facial) dysfunction Facial Symmetry: Abnormal symmetry left;Suspected CN VII (facial) dysfunction Facial Strength: Reduced left;Suspected CN VII (facial) dysfunction Lingual ROM: Within Functional Limits Lingual Symmetry: Within Functional Limits   Ice Chips Ice chips: Impaired Presentation: Spoon Oral Phase Functional Implications: Oral residue   Thin Liquid Thin Liquid: Impaired Presentation: Cup;Spoon;Self Fed Oral Phase Functional Implications: Oral residue Pharyngeal  Phase Impairments: Suspected delayed Swallow;Cough - Immediate    Nectar Thick Nectar Thick Liquid: Not tested   Honey Thick Honey Thick Liquid: Not tested   Puree Puree: Not tested   Solid     Solid: Not tested       Osie Bond., M.A. [place] Pager [phone] Office [phone]  04/18/2019,2:00 PM

## 2019-04-18 NOTE — Progress Notes (Signed)
Pt belongings at bedside include a cell phone, meds (family to pick up), and a blanket.

## 2019-04-18 NOTE — Plan of Care (Signed)
ED able to find family - Angela Alvarez nephew. Spoke with him. She is at baseline independent, lives by herself, mRS 0. Works in Clinical biochemist, works from home.  Angela Alvarez is the closes NOK. Has a sister, who has not been communicated to in years. Ileana Ladd.  -- Milon Dikes, MD [facility] Pager: [phone] If 7pm to 7am, please call on call as listed on AMION.

## 2019-04-18 NOTE — Progress Notes (Signed)
Notified Neuro MD Wilford Corner about patient's sodium level. Sodium level rose from 154 to 159 at around 1800. Sodium goal per order is 150-155. Notified MD that 3% saline rate was changed to 50 mL/hour from 75 mL/hour at 1548. Verbal order to change rate of 3% hypertonic saline to 10 mL/hour, and to continue sodium checks every 6 hours. Will continue to monitor.

## 2019-04-18 NOTE — Consult Note (Signed)
Physical Medicine and Rehabilitation Consult Reason for Consult: Left side weakness with dysarthria Referring Physician: Dr.Xu   HPI: Angela KentCatherine Alvarez is a 64 y.o. right-handed female with history of peripheral vascular disease status post toe amputations, headaches, COPD and quit smoking 3 years ago.  History taken from chart review due to cognition/lethargy.  Seen and evaluated with therapies.  Attempting to perform bedside swallow, however patient with significant coughing on small sips of water.  Per chart review patient lives alone reportedly independent working and driving prior to admission.  Two-level home with level entry.  Presented 04/17/2019 after being found down with left-sided weakness and dysarthria.  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  Associated mass-effect and approximately 5 mm right to left midline shift.  No intracranial hemorrhage.  MRI yet to be completed due to agitation.  Admission chemistries BUN 25, creatinine 0.83, troponin high-sensitivity 32, CK 312, SARS coronavirus negative.  Echocardiogram is pending.  Currently maintained on aspirin for CVA prophylaxis.  Subcutaneous Lovenox for DVT prophylaxis with lower extremity Dopplers pending.   Review of Systems  Unable to perform ROS: Mental acuity   Past Medical History:  Diagnosis Date  . Abnormal Pap smear of cervix 06-01-13   ascus pap:pos.HR HPV/hx of cryotherapy to cx 1985  . Arthritis    knee  . Asthma   . COPD (chronic obstructive pulmonary disease) (HCC)   . Critical lower limb ischemia    ischemic appearing left first toe  . Headache    MMigraines  . Peripheral vascular disease [facility](HCC)    Past Surgical History:  Procedure Laterality Date  . ABDOMINAL AORTOGRAM W/LOWER EXTREMITY Bilateral 09/11/2018   Procedure: ABDOMINAL AORTOGRAM W/LOWER  EXTREMITY;  Surgeon: Maeola Harmanain, Brandon Christopher, MD;  [facility];  Service: Cardiovascular;  Laterality: Bilateral;  . AMPUTATION Right 05/19/2015   Procedure: RIGHT FIRST RAY AMPUTATION;  Surgeon: Sherren Kernsharles E Fields, MD;  [facility];  Service: Vascular;  Laterality: Right;  . AMPUTATION Right 02/28/2019   Procedure: AMPUTATION DIGIT - RIGHT FOURTH TOE;  Surgeon: Maeola Harmanain, Brandon Christopher, MD;  [facility];  Service: Vascular;  Laterality: Right;  . BREAST SURGERY Right    cyst  . BUNIONECTOMY Bilateral    bilateral  . CARDIAC CATHETERIZATION Bilateral    Catarct  . COLONOSCOPY W/ POLYPECTOMY    . KNEE ARTHROPLASTY Right 07/21/2017  . PERIPHERAL VASCULAR CATHETERIZATION N/A 04/28/2015   Procedure: Lower Extremity Angiography;  Surgeon: Runell GessJonathan J Berry, MD;  [facility];  Service: Cardiovascular;  Laterality: N/A;  . PERIPHERAL VASCULAR INTERVENTION Left 09/11/2018   Procedure: PERIPHERAL VASCULAR INTERVENTION;  Surgeon: Maeola Harmanain, Brandon Christopher, MD;  [facility];  Service: Cardiovascular;  Laterality: Left;  SFA stent  . surgery under arm Right    --benign breast tissue   Family History  Problem Relation Age of Onset  . Stroke Mother        dec age 65  . Hypertension Mother   . Cancer Father        lung age 65  . Cancer Sister        dec colon ca age 65  . Hypertension Sister   . Diabetes Neg Hx   . Heart attack Neg Hx   . Hyperlipidemia Neg Hx   . Sudden death  Neg Hx    Social History:  reports that she quit smoking about 3 years ago. Her smoking use included cigarettes. She has a 45.00 pack-year smoking history. She has never used smokeless tobacco. She reports that she does not drink alcohol or use drugs. Allergies:  Allergies  Allergen Reactions  . Amoxicillin-Pot Clavulanate Nausea And Vomiting    Did it involve swelling of the face/tongue/throat, SOB, or low BP? No Did it involve sudden or severe rash/hives, skin  peeling, or any reaction on the inside of your mouth or nose? No Did you need to seek medical attention at a hospital or doctor's office? No When did it last happen?3 years If all above answers are "NO", may proceed with cephalosporin use.   . Levofloxacin Nausea And Vomiting   Medications Prior to Admission  Medication Sig Dispense Refill  . atorvastatin (LIPITOR) 40 MG tablet Take 40 mg by mouth at bedtime.     Marland Kitchen doxycycline (MONODOX) 100 MG capsule Take 100 mg by mouth daily. For ten days    . ezetimibe (ZETIA) 10 MG tablet Take 10 mg by mouth daily.    . rivaroxaban (XARELTO) 2.5 MG TABS tablet Take 1 tablet (2.5 mg total) by mouth daily. 30 tablet 3  . albuterol (PROVENTIL) (2.5 MG/3ML) 0.083% nebulizer solution Take 2.5 mg by nebulization every 6 (six) hours as needed for wheezing or shortness of breath.    Marland Kitchen albuterol (PROVENTIL,VENTOLIN) 90 MCG/ACT inhaler Inhale 2 puffs into the lungs every 4 (four) hours as needed for wheezing or shortness of breath.     Marland Kitchen aspirin EC 81 MG tablet Take 81 mg by mouth at bedtime.     . budesonide-formoterol (SYMBICORT) 160-4.5 MCG/ACT inhaler Inhale 2 puffs into the lungs 2 (two) times daily.      . clobetasol cream (TEMOVATE) 0.05 % APPLY TOPICALLY TWICE WEEKLY (Patient taking differently: Apply 1 application topically 2 (two) times a week. ) 30 g 0  . HYDROcodone-acetaminophen (NORCO) 5-325 MG tablet Take 1 tablet by mouth every 6 (six) hours as needed for moderate pain. 20 tablet 0  . ibuprofen (ADVIL) 200 MG tablet Take 800 mg by mouth every 8 (eight) hours as needed for headache or moderate pain (severe pain.).     Marland Kitchen Ibuprofen-diphenhydrAMINE HCl (ADVIL PM) 200-25 MG CAPS Take 2 tablets by mouth at bedtime as needed (sleep/pain.).    Marland Kitchen ondansetron (ZOFRAN) 4 MG tablet Take 1 tablet (4 mg total) by mouth every 8 (eight) hours as needed for nausea or vomiting. 20 tablet 0    Home: Home Living Family/patient expects to be discharged to::  Private residence Living Arrangements: Alone Available Help at Discharge: Family, Available PRN/intermittently Type of Home: House Home Access: Level entry Home Layout: Two level Alternate Level Stairs-Number of Steps: 16 Bathroom Shower/Tub: Tub/shower unit, Multimedia programmer: Handicapped height Additional Comments: Pt reports having "a guy that comes 3 hours daily."  Functional History: Prior Function Level of Independence: Independent Comments: working, driving, family brings groceries or they deliver Functional Status:  Mobility: Bed Mobility Overal bed mobility: Needs Assistance Bed Mobility: Sit to Supine Supine to sit: Max assist Sit to supine: Max assist, +2 for physical assistance General bed mobility comments: cues for direction and assist to lay down against pt's resistance Transfers Overall transfer level: Needs assistance Equipment used: None Transfers: Sit to/from Stand, Stand Pivot Transfers Sit to Stand: Max assist, +2 physical assistance, +2 safety/equipment Stand pivot transfers: Max assist, +2 physical assistance, +2  safety/equipment General transfer comment: cues for hand placement and posture set up, but pt unable to follow and hold positionin.  face to face assist to come forward and stand/ transfer.  pt unable to support weight on the L LE automatically, but trying decreased awareness of L LE deficits.   Ambulation/Gait General Gait Details: unable    ADL: ADL Overall ADL's : Needs assistance/impaired  Cognition: Cognition Overall Cognitive Status: Impaired/Different from baseline Orientation Level: Oriented to place, Oriented to time, Oriented to situation, Disoriented to situation Cognition Arousal/Alertness: Awake/alert Behavior During Therapy: Restless, Flat affect Overall Cognitive Status: Impaired/Different from baseline Area of Impairment: Orientation, Attention, Following commands, Safety/judgement, Awareness, Problem  solving Orientation Level: Situation, Time Current Attention Level: Focused, Sustained Following Commands: Follows one step commands inconsistently, Follows one step commands with increased time Safety/Judgement: Decreased awareness of safety, Decreased awareness of deficits Awareness: Intellectual Problem Solving: Slow processing, Decreased initiation, Difficulty sequencing, Requires verbal cues, Requires tactile cues  Blood pressure (!) 151/65, pulse 95, temperature 98.7 F (37.1 C), temperature source Oral, resp. rate (!) 26, height 5\' 6"  (1.676 m), weight 70.1 kg, last menstrual period 04/05/2004, SpO2 97 %. Physical Exam  Vitals reviewed. Constitutional: She appears well-developed and well-nourished.  HENT:  Head: Normocephalic and atraumatic.  Eyes: Right eye exhibits no discharge. Left eye exhibits no discharge.  Right gaze preference, keeps eyes closed  Neck: No tracheal deviation present. No thyromegaly present.  Respiratory: Effort normal. No respiratory distress.  GI: She exhibits no distension.  Musculoskeletal:     Comments: No edema or tenderness in extremities  Neurological:  Lethargic Right lean Dysarthria Motor: Inconsistently follows commands Really moving right upper extremity right lower extremity No movement noted in left upper extremity Moving foot of left lower extremity Sensation absent on left side  Skin: Skin is warm and dry.  Psychiatric:  Unable to assess due to cognition/lethargy    Results for orders placed or performed during the hospital encounter of 04/17/19 (from the past 24 hour(s))  Ethanol     Status: None   Collection Time: 04/17/19 10:04 PM  Result Value Ref Range   Alcohol, Ethyl (B) <10 <10 mg/dL  Protime-INR     Status: None   Collection Time: 04/17/19 10:04 PM  Result Value Ref Range   Prothrombin Time 14.1 11.4 - 15.2 seconds   INR 1.1 0.8 - 1.2  APTT     Status: Abnormal   Collection Time: 04/17/19 10:04 PM  Result Value Ref  Range   aPTT 20 (L) 24 - 36 seconds  CBC     Status: Abnormal   Collection Time: 04/17/19 10:04 PM  Result Value Ref Range   WBC 12.7 (H) 4.0 - 10.5 K/uL   RBC 5.00 3.87 - 5.11 MIL/uL   Hemoglobin 13.5 12.0 - 15.0 g/dL   HCT 16.1 09.6 - 04.5 %   MCV 87.4 80.0 - 100.0 fL   MCH 27.0 26.0 - 34.0 pg   MCHC 30.9 30.0 - 36.0 g/dL   RDW 40.9 81.1 - 91.4 %   Platelets 422 (H) 150 - 400 K/uL   nRBC 0.0 0.0 - 0.2 %  Differential     Status: Abnormal   Collection Time: 04/17/19 10:04 PM  Result Value Ref Range   Neutrophils Relative % 79 %   Neutro Abs 10.1 (H) 1.7 - 7.7 K/uL   Lymphocytes Relative 10 %   Lymphs Abs 1.3 0.7 - 4.0 K/uL   Monocytes Relative 9 %  Monocytes Absolute 1.1 (H) 0.1 - 1.0 K/uL   Eosinophils Relative 0 %   Eosinophils Absolute 0.0 0.0 - 0.5 K/uL   Basophils Relative 1 %   Basophils Absolute 0.1 0.0 - 0.1 K/uL   Immature Granulocytes 1 %   Abs Immature Granulocytes 0.06 0.00 - 0.07 K/uL  Comprehensive metabolic panel     Status: Abnormal   Collection Time: 04/17/19 10:04 PM  Result Value Ref Range   Sodium 141 135 - 145 mmol/L   Potassium 3.8 3.5 - 5.1 mmol/L   Chloride 106 98 - 111 mmol/L   CO2 24 22 - 32 mmol/L   Glucose, Bld 127 (H) 70 - 99 mg/dL   BUN 25 (H) 8 - 23 mg/dL   Creatinine, Ser 7.16 0.44 - 1.00 mg/dL   Calcium 8.9 8.9 - 96.7 mg/dL   Total Protein 7.2 6.5 - 8.1 g/dL   Albumin 3.4 (L) 3.5 - 5.0 g/dL   AST 30 15 - 41 U/L   ALT 28 0 - 44 U/L   Alkaline Phosphatase 73 38 - 126 U/L   Total Bilirubin 1.1 0.3 - 1.2 mg/dL   GFR calc non Af Amer >60 >60 mL/min   GFR calc Af Amer >60 >60 mL/min   Anion gap 11 5 - 15  Troponin I (High Sensitivity)     Status: Abnormal   Collection Time: 04/17/19 10:04 PM  Result Value Ref Range   Troponin I (High Sensitivity) 32 (H) <18 ng/L  CK     Status: Abnormal   Collection Time: 04/17/19 10:04 PM  Result Value Ref Range   Total CK 312 (H) 38 - 234 U/L  CBG monitoring, ED     Status: Abnormal    Collection Time: 04/17/19 10:10 PM  Result Value Ref Range   Glucose-Capillary 121 (H) 70 - 99 mg/dL  Respiratory Panel by RT PCR (Flu A&B, Covid) - Nasopharyngeal Swab     Status: None   Collection Time: 04/18/19  1:30 AM   Specimen: Nasopharyngeal Swab  Result Value Ref Range   SARS Coronavirus 2 by RT PCR NEGATIVE NEGATIVE   Influenza A by PCR NEGATIVE NEGATIVE   Influenza B by PCR NEGATIVE NEGATIVE  Troponin I (High Sensitivity)     Status: Abnormal   Collection Time: 04/18/19  2:30 AM  Result Value Ref Range   Troponin I (High Sensitivity) 36 (H) <18 ng/L  HIV Antibody (routine testing w rflx)     Status: None   Collection Time: 04/18/19  2:30 AM  Result Value Ref Range   HIV Screen 4th Generation wRfx NON REACTIVE NON REACTIVE  Hemoglobin A1c     Status: Abnormal   Collection Time: 04/18/19  2:30 AM  Result Value Ref Range   Hgb A1c MFr Bld 5.9 (H) 4.8 - 5.6 %   Mean Plasma Glucose 122.63 mg/dL  Sodium     Status: None   Collection Time: 04/18/19  2:30 AM  Result Value Ref Range   Sodium 144 135 - 145 mmol/L  Lipid panel     Status: None   Collection Time: 04/18/19  2:45 AM  Result Value Ref Range   Cholesterol 140 0 - 200 mg/dL   Triglycerides 88 <893 mg/dL   HDL 45 >81 mg/dL   Total CHOL/HDL Ratio 3.1 RATIO   VLDL 18 0 - 40 mg/dL   LDL Cholesterol 77 0 - 99 mg/dL  MRSA PCR Screening     Status: None   Collection  Time: 04/18/19  3:01 AM   Specimen: Nasopharyngeal  Result Value Ref Range   MRSA by PCR NEGATIVE NEGATIVE  Lactic acid, plasma     Status: None   Collection Time: 04/18/19  5:55 AM  Result Value Ref Range   Lactic Acid, Venous 1.8 0.5 - 1.9 mmol/L  Sodium     Status: Abnormal   Collection Time: 04/18/19  5:55 AM  Result Value Ref Range   Sodium 148 (H) 135 - 145 mmol/L   CT Angio Head W or Wo Contrast  Result Date: 04/18/2019 CLINICAL DATA:  Follow-up examination for acute stroke. EXAM: CT ANGIOGRAPHY HEAD AND NECK TECHNIQUE: Multidetector CT  imaging of the head and neck was performed using the standard protocol during bolus administration of intravenous contrast. Multiplanar CT image reconstructions and MIPs were obtained to evaluate the vascular anatomy. Carotid stenosis measurements (when applicable) are obtained utilizing NASCET criteria, using the distal internal carotid diameter as the denominator. CONTRAST:  75mL OMNIPAQUE IOHEXOL 350 MG/ML SOLN COMPARISON:  Prior CT from 04/17/2019. FINDINGS: CTA NECK FINDINGS Aortic arch: Visualized aortic arch of normal caliber with normal 3 vessel morphology. Moderate atherosclerotic change about the arch and origin of the great vessels without hemodynamically significant stenosis. Visualized subclavian arteries widely patent. Right carotid system: Mild scattered atheromatous irregularity within the right CCA without hemodynamically significant stenosis. Possible small penetrating plaque noted at the distal right CCA (series 7, image 226). Mixed atheromatous disease noted about the right carotid bifurcation. Right ICA occluded at the bifurcation, and remains occluded within the neck. Origin of the right external carotid artery appears occluded as well, but is patent distally, suspected to be through retrograde filling. Left carotid system: Left CCA patent from its origin to the bifurcation without stenosis. Mild atheromatous irregularity about the left bifurcation without hemodynamically significant stenosis. Left ICA patent distally to the skull base without stenosis, dissection or occlusion. Vertebral arteries: Both vertebral arteries arise from the subclavian arteries. Dominant left vertebral artery widely patent within the neck. Severe atheromatous stenosis of approximately 80% seen at the origin of the right vertebral artery (series 7, image 275). Right vertebral otherwise widely patent within the neck as well. Skeleton: No acute osseous abnormality. No discrete osseous lesions. Moderate cervical  spondylosis noted at C4-5 through C6-7. Other neck: No other acute soft tissue abnormality within the neck. No mass lesion or adenopathy. Upper chest: Visualized upper chest demonstrates no acute finding. Mild centrilobular emphysema. Review of the MIP images confirms the above findings CTA HEAD FINDINGS Anterior circulation: Petrous left ICA widely patent. Mild scattered calcified plaque within the cavernous/supraclinoid left ICA without hemodynamically significant stenosis. Left ICA terminus well perfused. Left A1 patent. Normal in patent anterior communicating artery. Both A2 segments well perfused and patent to their distal aspects. Filling of the right A1 to the terminus via collateral flow. Scant attenuated opacification of the right M1 segment and distal MCA branches, likely collateral flow, either across the circle-of-Willis or retrograde in nature. Left M1 widely patent. Normal left MCA bifurcation. Distal left MCA branches well perfused. Posterior circulation: Focal atheromatous plaque at the left V4 segment as it crosses the dural reflection with short-segment moderate stenosis. Vertebral arteries otherwise widely patent to the vertebrobasilar junction. Posterior inferior cerebral arteries patent bilaterally. Basilar widely patent to its distal aspect without stenosis. Superior cerebral arteries patent bilaterally. Both PCAs well perfused to their distal aspects without stenosis. Small left posterior communicating artery noted. Venous sinuses: Patent. Anatomic variants: None significant. Review of the  MIP images confirms the above findings IMPRESSION: 1. 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 Scant attenuated flow within the right MCA via collateral flow across the circle-of-Willis and/or retrograde filling. 2. Mild atheromatous irregularity about the left carotid bifurcation and left carotid siphon without hemodynamically significant stenosis. 3. 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 Short-segment 50% stenosis at the proximal left V4 segment. Posterior circulation otherwise widely patent. 4. Large evolving subacute right MCA territory infarct with associated mass effect and midline shift, stable from prior CT. 5.  Emphysema (ICD10-J43.9). Electronically Signed   By: Rise Mu M.D.   On: 04/18/2019 02:04   CT Head Wo Contrast  Result Date: 04/17/2019 CLINICAL DATA:  64 year old female with focal neurologic deficit. EXAM: CT HEAD WITHOUT CONTRAST TECHNIQUE: Contiguous axial images were obtained from the base of the skull through the vertex without intravenous contrast. COMPARISON:  None. FINDINGS: Brain: There is a large area of low attenuation with loss of gray-white matter discrimination involving the right MCA territory consistent with an acute or subacute infarct. There is associated edema and overall increased volume with associated mass effect and compression of the right cerebral sulci and right lateral ventricle. There is approximately 5 mm right to left midline shift. There is no acute intracranial hemorrhage. Vascular: Large right MCA clot measuring approximately 6 mm. There is probable occlusion of the right supraclinoid ICA. Further evaluation with CT angiography recommended. Skull: Normal. Negative for fracture or focal lesion. Sinuses/Orbits: The visualized paranasal sinuses and mastoid air cells are clear. Other: None IMPRESSION: Occlusion of the right MCA and possibly portions of the right supraclinoid ICA with a large area of right MCA territory infarct, likely acute or subacute. There is associated mass effect and approximately 5 mm right to left midline shift. No acute intracranial hemorrhage. These results were called by telephone at the time of interpretation on 04/17/2019 at 11:06 pm to Dr. Webb Silversmith who verbally acknowledged these results. Electronically Signed   By: Elgie Collard M.D.   On:  04/17/2019 23:11   CT Angio Neck W and/or Wo Contrast  Result Date: 04/18/2019 CLINICAL DATA:  Follow-up examination for acute stroke. EXAM: CT ANGIOGRAPHY HEAD AND NECK TECHNIQUE: Multidetector CT imaging of the head and neck was performed using the standard protocol during bolus administration of intravenous contrast. Multiplanar CT image reconstructions and MIPs were obtained to evaluate the vascular anatomy. Carotid stenosis measurements (when applicable) are obtained utilizing NASCET criteria, using the distal internal carotid diameter as the denominator. CONTRAST:  69mL OMNIPAQUE IOHEXOL 350 MG/ML SOLN COMPARISON:  Prior CT from 04/17/2019. FINDINGS: CTA NECK FINDINGS Aortic arch: Visualized aortic arch of normal caliber with normal 3 vessel morphology. Moderate atherosclerotic change about the arch and origin of the great vessels without hemodynamically significant stenosis. Visualized subclavian arteries widely patent. Right carotid system: Mild scattered atheromatous irregularity within the right CCA without hemodynamically significant stenosis. Possible small penetrating plaque noted at the distal right CCA (series 7, image 226). Mixed atheromatous disease noted about the right carotid bifurcation. Right ICA occluded at the bifurcation, and remains occluded within the neck. Origin of the right external carotid artery appears occluded as well, but is patent distally, suspected to be through retrograde filling. Left carotid system: Left CCA patent from its origin to the bifurcation without stenosis. Mild atheromatous irregularity about the left bifurcation without hemodynamically significant stenosis. Left ICA patent distally to the skull base without stenosis, dissection  or occlusion. Vertebral arteries: Both vertebral arteries arise from the subclavian arteries. Dominant left vertebral artery widely patent within the neck. Severe atheromatous stenosis of approximately 80% seen at the origin of the  right vertebral artery (series 7, image 275). Right vertebral otherwise widely patent within the neck as well. Skeleton: No acute osseous abnormality. No discrete osseous lesions. Moderate cervical spondylosis noted at C4-5 through C6-7. Other neck: No other acute soft tissue abnormality within the neck. No mass lesion or adenopathy. Upper chest: Visualized upper chest demonstrates no acute finding. Mild centrilobular emphysema. Review of the MIP images confirms the above findings CTA HEAD FINDINGS Anterior circulation: Petrous left ICA widely patent. Mild scattered calcified plaque within the cavernous/supraclinoid left ICA without hemodynamically significant stenosis. Left ICA terminus well perfused. Left A1 patent. Normal in patent anterior communicating artery. Both A2 segments well perfused and patent to their distal aspects. Filling of the right A1 to the terminus via collateral flow. Scant attenuated opacification of the right M1 segment and distal MCA branches, likely collateral flow, either across the circle-of-Willis or retrograde in nature. Left M1 widely patent. Normal left MCA bifurcation. Distal left MCA branches well perfused. Posterior circulation: Focal atheromatous plaque at the left V4 segment as it crosses the dural reflection with short-segment moderate stenosis. Vertebral arteries otherwise widely patent to the vertebrobasilar junction. Posterior inferior cerebral arteries patent bilaterally. Basilar widely patent to its distal aspect without stenosis. Superior cerebral arteries patent bilaterally. Both PCAs well perfused to their distal aspects without stenosis. Small left posterior communicating artery noted. Venous sinuses: Patent. Anatomic variants: None significant. Review of the MIP images confirms the above findings IMPRESSION: 1. 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 Scant attenuated flow within the right MCA via collateral flow across the  circle-of-Willis and/or retrograde filling. 2. Mild atheromatous irregularity about the left carotid bifurcation and left carotid siphon without hemodynamically significant stenosis. 3. Severe 80% atheromatous stenosis at the origin of the right vertebral artery. Short-segment 50% stenosis at the proximal left V4 segment. Posterior circulation otherwise widely patent. 4. Large evolving subacute right MCA territory infarct with associated mass effect and midline shift, stable from prior CT. 5.  Emphysema (ICD10-J43.9). Electronically Signed   By: Rise Mu M.D.   On: 04/18/2019 02:04   CT Cervical Spine Wo Contrast  Result Date: 04/17/2019 CLINICAL DATA:  Possible fall EXAM: CT CERVICAL SPINE WITHOUT CONTRAST TECHNIQUE: Multidetector CT imaging of the cervical spine was performed without intravenous contrast. Multiplanar CT image reconstructions were also generated. COMPARISON:  None. FINDINGS: Alignment: No subluxation Skull base and vertebrae: No acute fracture. No primary bone lesion or focal pathologic process. Soft tissues and spinal canal: No prevertebral fluid or swelling. No visible canal hematoma. Disc levels: Degenerative disc disease from C4-5 through C6-7. Diffuse degenerative facet disease. Upper chest: No acute findings Other: Carotid artery calcifications. IMPRESSION: Degenerative disc and facet disease.  No acute bony abnormality. Electronically Signed   By: Charlett Nose M.D.   On: 04/17/2019 22:57   DG CHEST PORT 1 VIEW  Result Date: 04/18/2019 CLINICAL DATA:  Stroke EXAM: PORTABLE CHEST 1 VIEW COMPARISON:  04/25/2015 FINDINGS: Normal heart size for technique. Negative mediastinal contours. There is no edema, consolidation, effusion, or pneumothorax. No acute osseous finding. IMPRESSION: No evidence of active disease. Electronically Signed   By: Marnee Spring M.D.   On: 04/18/2019 05:03   DG Shoulder Left  Result Date: 04/18/2019 CLINICAL DATA:  Fall with left  shoulder  bruising. EXAM: LEFT SHOULDER - 2+ VIEW COMPARISON:  None. FINDINGS: There is no evidence of fracture or dislocation. There is no evidence of arthropathy or other focal bone abnormality. Soft tissues are unremarkable. IMPRESSION: Negative. Electronically Signed   By: Marnee SpringJonathon  Watts M.D.   On: 04/18/2019 05:01   DG Humerus Left  Result Date: 04/18/2019 CLINICAL DATA:  Fall 3 days ago with left shoulder bruising. EXAM: LEFT HUMERUS - 2+ VIEW COMPARISON:  None. FINDINGS: There is no evidence of acute fracture or malalignment. Lateral left fifth rib fracture which has a chronic and healed appearance. IMPRESSION: Negative. Electronically Signed   By: Marnee SpringJonathon  Watts M.D.   On: 04/18/2019 05:02   DG Hip Unilat With Pelvis 2-3 Views Left  Result Date: 04/18/2019 CLINICAL DATA:  Fall 3 days ago with bruising to the left hip. EXAM: DG HIP (WITH OR WITHOUT PELVIS) 2-3V LEFT COMPARISON:  None. FINDINGS: There is no evidence of hip fracture or dislocation. There is no evidence of arthropathy or other focal bone abnormality. IMPRESSION: Negative. Electronically Signed   By: Marnee SpringJonathon  Watts M.D.   On: 04/18/2019 05:02    Assessment/Plan: Diagnosis: Right MCA infarct Stroke: Continue secondary stroke prophylaxis and Risk Factor Modification listed below:   Antiplatelet therapy:   Blood Pressure Management:  Continue current medication with prn's with permisive HTN per primary team Statin Agent:   Pre-diabetes management:   Left sided hemiparesis: fit for orthosis to prevent contractures (resting hand splint for day, wrist cock up splint at night, PRAFO, etc) Motor recovery: Fluoxetine Labs independently reviewed.  Records reviewed and summated above.  1. Does the need for close, 24 hr/day medical supervision in concert with the patient's rehab needs make it unreasonable for this patient to be served in a less intensive setting? Yes 2. Co-Morbidities requiring supervision/potential complications: peripheral  vascular disease status post toe amputations, headaches, COPD, tachypnea, HTN (monitor and provide prns in accordance with increased physical exertion and pain), prediabetes, leukocytosis (repeat labs, cont to monitor for signs and symptoms of infection, further workup if indicated) 3. Due to bladder management, bowel management, safety, skin/wound care, disease management, medication administration, pain management and patient education, does the patient require 24 hr/day rehab nursing? Yes 4. Does the patient require coordinated care of a physician, rehab nurse, therapy disciplines of PT/OT/SLP to address physical and functional deficits in the context of the above medical diagnosis(es)? Yes Addressing deficits in the following areas: balance, endurance, locomotion, strength, transferring, bowel/bladder control, bathing, dressing, feeding, grooming, toileting, cognition, speech, language, swallowing and psychosocial support 5. Can the patient actively participate in an intensive therapy program of at least 3 hrs of therapy per day at least 5 days per week? Potentially 6. The potential for patient to make measurable gains while on inpatient rehab is excellent 7. Anticipated functional outcomes upon discharge from inpatient rehab are min assist  with PT, min assist with OT, modified independent and supervision with SLP. 8. Estimated rehab length of stay to reach the above functional goals is: 21-25 days. 9. Anticipated discharge destination: Home 10. Overall Rehab/Functional Prognosis: good  RECOMMENDATIONS: This patient's condition is appropriate for continued rehabilitative care in the following setting: CIR if caregiver support available upon discharge after completion of medical work-up. Patient has agreed to participate in recommended program. Potentially Note that insurance prior authorization may be required for reimbursement for recommended care.  Comment: Rehab Admissions Coordinator to  follow up.  I have personally performed a face to face diagnostic  evaluation, including, but not limited to relevant history and physical exam findings, of this patient and developed relevant assessment and plan.  Additionally, I have reviewed and concur with the physician assistant's documentation above.   Maryla Morrow, MD, ABPMR Mcarthur Rossetti Angiulli, PA-C 04/18/2019

## 2019-04-18 NOTE — ED Notes (Signed)
Patient transported to CT 

## 2019-04-18 NOTE — Evaluation (Addendum)
Speech Language Pathology Evaluation Patient Details Name: Angela Alvarez MRN: [id number] DOB: July 31, 1955 Today's Date: 04/18/2019 Time: 1761-6073 SLP Time Calculation (min) (ACUTE ONLY): 12 min  Problem List:  Patient Active Problem List   Diagnosis Date Noted  . Acute ischemic stroke ([place]) 04/18/2019  . PVD (peripheral vascular disease) ([place])   . Chronic obstructive pulmonary disease ([place])   . Essential hypertension   . Tachypnea   . Prediabetes   . Leukocytosis   . Dyspnea on exertion 06/30/2015  . Atherosclerosis of native arteries of extremities with gangrene, unspecified extremity ([place]) 05/19/2015  . Critical lower limb ischemia 04/09/2015  . ASCUS with positive high risk human papillomavirus of vagina 01/09/2014  . Foot pain 12/04/2010  . Metatarsalgia 12/04/2010  . Right knee pain 11/23/2010  . SMOKER 01/08/2010  . COPD 01/07/2010   Past Medical History:  Past Medical History:  Diagnosis Date  . Abnormal Pap smear of cervix 06-01-13   [place] pap:pos.HR HPV/hx of cryotherapy to cx 1985  . Arthritis    knee  . Asthma   . COPD (chronic obstructive pulmonary disease) ([place])   . Critical lower limb ischemia    ischemic appearing left first toe  . Headache    MMigraines  . Peripheral vascular disease [facility])    Past Surgical History:  Past Surgical History:  Procedure Laterality Date  . ABDOMINAL AORTOGRAM W/LOWER EXTREMITY Bilateral 09/11/2018   Procedure: ABDOMINAL AORTOGRAM W/LOWER EXTREMITY;  Surgeon: Waynetta Sandy, MD;  Location: [place] [facility];  Service: Cardiovascular;  Laterality: Bilateral;  . AMPUTATION Right 05/19/2015   Procedure: RIGHT FIRST RAY AMPUTATION;  Surgeon: Elam Dutch, MD;  Location: [place];  Service: Vascular;  Laterality: Right;  . AMPUTATION Right 02/28/2019   Procedure: AMPUTATION DIGIT - RIGHT FOURTH TOE;  Surgeon: Waynetta Sandy, MD;  Location: [place];  Service: Vascular;  Laterality: Right;  . BREAST SURGERY  Right    cyst  . BUNIONECTOMY Bilateral    bilateral  . CARDIAC CATHETERIZATION Bilateral    Catarct  . COLONOSCOPY W/ POLYPECTOMY    . KNEE ARTHROPLASTY Right 07/21/2017  . PERIPHERAL VASCULAR CATHETERIZATION N/A 04/28/2015   Procedure: Lower Extremity Angiography;  Surgeon: Lorretta Harp, MD;  Location: [place] [facility];  Service: Cardiovascular;  Laterality: N/A;  . PERIPHERAL VASCULAR INTERVENTION Left 09/11/2018   Procedure: PERIPHERAL VASCULAR INTERVENTION;  Surgeon: Waynetta Sandy, MD;  Location: [place] [facility];  Service: Cardiovascular;  Laterality: Left;  SFA stent  . surgery under arm Right    --benign breast tissue   HPI:  Pt is a 64 y.o. female found down at home, presenting with L sided weakness and dysarthria. CT showed large R MCA territory infarct with 58mm midline shift; MRI pending. PMH: PVD s/p toe amputations, headaches, COPD    Assessment / Plan / Recommendation Clinical Impression   Pt has left inattention, favoring her head to the right but turning her it toward the left when cued. It is difficult to judge her gaze because she keeps her eyes closed throughout the evaluation. Pt follows one-step commands well and showed signs of carryover during the evaluation when given repeated stimulus and teachback. Moderate dysarthria is noted given left-sided motor and suspected sensory deficits. Pt attributes this to her dry mouth, initially denying any symptoms from her stroke but able to identify her left-sided weakness of her LUE/LLE with Min cues. She has limited insight into her acute deficits, which also means she has impaired safety  awareness. Pt would benefit from SLP f/u to address cognition, safety, and motor speech.     SLP Assessment  SLP Recommendation/Assessment: Patient needs continued Speech Lanaguage Pathology Services SLP Visit Diagnosis: Dysarthria and anarthria (R47.1);Cognitive communication deficit (R41.841)    Follow Up Recommendations   Inpatient Rehab    Frequency and Duration min 2x/week  2 weeks      SLP Evaluation Cognition  Overall Cognitive Status: Impaired/Different from baseline Arousal/Alertness: Awake/alert(drowsy but can be aroused for participation) Orientation Level: Oriented X4 Attention: Sustained Sustained Attention: Impaired Sustained Attention Impairment: Verbal complex Awareness: Impaired Awareness Impairment: Intellectual impairment;Emergent impairment;Anticipatory impairment Problem Solving: Impaired Problem Solving Impairment: Functional basic Executive Function: Self Monitoring;Self Correcting Self Monitoring: Impaired Self Monitoring Impairment: Verbal basic;Functional basic Self Correcting: Impaired Self Correcting Impairment: Verbal basic;Functional basic Behaviors: Restless Safety/Judgment: Impaired Comments: L inattention       Comprehension  Auditory Comprehension Overall Auditory Comprehension: Appears within functional limits for tasks assessed(basic questions and commands)    Expression Expression Primary Mode of Expression: Verbal Verbal Expression Overall Verbal Expression: Appears within functional limits for tasks assessed Written Expression Dominant Hand: Right   Oral / Motor  Oral Motor/Sensory Function Overall Oral Motor/Sensory Function: Moderate impairment Facial ROM: Reduced left;Suspected CN VII (facial) dysfunction Facial Symmetry: Abnormal symmetry left;Suspected CN VII (facial) dysfunction Facial Strength: Reduced left;Suspected CN VII (facial) dysfunction Lingual ROM: Within Functional Limits Lingual Symmetry: Within Functional Limits Motor Speech Overall Motor Speech: Impaired Respiration: Within functional limits Phonation: Normal Articulation: Impaired Level of Impairment: Phrase Intelligibility: Intelligibility reduced Word: 50-74% accurate Phrase: 50-74% accurate   GO                     Mahala Menghini., M.A. CCC-SLP Acute Rehabilitation  Services Pager [phone] Office [phone]  04/18/2019, 2:08 PM

## 2019-04-18 NOTE — Progress Notes (Signed)
Rehab Admissions Coordinator Note:  Patient was screened by Angela Alvarez for appropriateness for an Inpatient Acute Rehab Consult per PT recs. .  At this time, we are recommending Inpatient Rehab consult. I will place order per protocol.  Angela Alvarez 04/18/2019, 12:26 PM  I can be reached at [phone].

## 2019-04-18 NOTE — Progress Notes (Signed)
STROKE TEAM PROGRESS NOTE   INTERVAL HISTORY Pt lying in bed, RN at bedside. Pt talkative but moderate to severe dysarthria. Left hemineglect, hemiplgia and anosognosia. She denies left sided weakness, and stated that she has to get up to work today. Mildly agitated and not able to tolerate MRI at this time.   Vitals:   04/18/19 0715 04/18/19 0730 04/18/19 0800 04/18/19 0827  BP: (!) 174/97 (!) 157/90 (!) 190/92 (!) 144/117  Pulse: (!) 113 89 (!) 113   Resp: 20 16 13    Temp:      TempSrc:      SpO2: 97% 97% 98%   Weight:      Height:        CBC:  Recent Labs  Lab 04/17/19 2204  WBC 12.7*  NEUTROABS 10.1*  HGB 13.5  HCT 43.7  MCV 87.4  PLT 422*    Basic Metabolic Panel:  Recent Labs  Lab 04/17/19 2204 04/18/19 0230 04/18/19 0555  NA 141 144 148*  K 3.8  --   --   CL 106  --   --   CO2 24  --   --   GLUCOSE 127*  --   --   BUN 25*  --   --   CREATININE 0.83  --   --   CALCIUM 8.9  --   --    Lipid Panel:     Component Value Date/Time   CHOL 140 04/18/2019 0245   TRIG 88 04/18/2019 0245   HDL 45 04/18/2019 0245   CHOLHDL 3.1 04/18/2019 0245   VLDL 18 04/18/2019 0245   LDLCALC 77 04/18/2019 0245   HgbA1c:  Lab Results  Component Value Date   HGBA1C 5.9 (H) 04/18/2019   Urine Drug Screen: No results found for: LABOPIA, COCAINSCRNUR, LABBENZ, AMPHETMU, THCU, LABBARB  Alcohol Level     Component Value Date/Time   ETH <10 04/17/2019 2204    IMAGING past 48 hours CT Angio Head W or Wo Contrast  Result Date: 04/18/2019 CLINICAL DATA:  Follow-up examination for acute stroke. EXAM: CT ANGIOGRAPHY HEAD AND NECK TECHNIQUE: Multidetector CT imaging of the head and neck was performed using the standard protocol during bolus administration of intravenous contrast. Multiplanar CT image reconstructions and MIPs were obtained to evaluate the vascular anatomy. Carotid stenosis measurements (when applicable) are obtained utilizing NASCET criteria, using the distal  internal carotid diameter as the denominator. CONTRAST:  21mL OMNIPAQUE IOHEXOL 350 MG/ML SOLN COMPARISON:  Prior CT from 04/17/2019. FINDINGS: CTA NECK FINDINGS Aortic arch: Visualized aortic arch of normal caliber with normal 3 vessel morphology. Moderate atherosclerotic change about the arch and origin of the great vessels without hemodynamically significant stenosis. Visualized subclavian arteries widely patent. Right carotid system: Mild scattered atheromatous irregularity within the right CCA without hemodynamically significant stenosis. Possible small penetrating plaque noted at the distal right CCA (series 7, image 226). Mixed atheromatous disease noted about the right carotid bifurcation. Right ICA occluded at the bifurcation, and remains occluded within the neck. Origin of the right external carotid artery appears occluded as well, but is patent distally, suspected to be through retrograde filling. Left carotid system: Left CCA patent from its origin to the bifurcation without stenosis. Mild atheromatous irregularity about the left bifurcation without hemodynamically significant stenosis. Left ICA patent distally to the skull base without stenosis, dissection or occlusion. Vertebral arteries: Both vertebral arteries arise from the subclavian arteries. Dominant left vertebral artery widely patent within the neck. Severe atheromatous stenosis of approximately  80% seen at the origin of the right vertebral artery (series 7, image 275). Right vertebral otherwise widely patent within the neck as well. Skeleton: No acute osseous abnormality. No discrete osseous lesions. Moderate cervical spondylosis noted at C4-5 through C6-7. Other neck: No other acute soft tissue abnormality within the neck. No mass lesion or adenopathy. Upper chest: Visualized upper chest demonstrates no acute finding. Mild centrilobular emphysema. Review of the MIP images confirms the above findings CTA HEAD FINDINGS Anterior circulation:  Petrous left ICA widely patent. Mild scattered calcified plaque within the cavernous/supraclinoid left ICA without hemodynamically significant stenosis. Left ICA terminus well perfused. Left A1 patent. Normal in patent anterior communicating artery. Both A2 segments well perfused and patent to their distal aspects. Filling of the right A1 to the terminus via collateral flow. Scant attenuated opacification of the right M1 segment and distal MCA branches, likely collateral flow, either across the circle-of-Willis or retrograde in nature. Left M1 widely patent. Normal left MCA bifurcation. Distal left MCA branches well perfused. Posterior circulation: Focal atheromatous plaque at the left V4 segment as it crosses the dural reflection with short-segment moderate stenosis. Vertebral arteries otherwise widely patent to the vertebrobasilar junction. Posterior inferior cerebral arteries patent bilaterally. Basilar widely patent to its distal aspect without stenosis. Superior cerebral arteries patent bilaterally. Both PCAs well perfused to their distal aspects without stenosis. Small left posterior communicating artery noted. Venous sinuses: Patent. Anatomic variants: None significant. Review of the MIP images confirms the above findings IMPRESSION: 1. 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 Scant attenuated flow within the right MCA via collateral flow across the circle-of-Willis and/or retrograde filling. 2. Mild atheromatous irregularity about the left carotid bifurcation and left carotid siphon without hemodynamically significant stenosis. 3. Severe 80% atheromatous stenosis at the origin of the right vertebral artery. Short-segment 50% stenosis at the proximal left V4 segment. Posterior circulation otherwise widely patent. 4. Large evolving subacute right MCA territory infarct with associated mass effect and midline shift, stable from prior CT. 5.  Emphysema (ICD10-J43.9).  Electronically Signed   By: Rise Mu M.D.   On: 04/18/2019 02:04   CT Head Wo Contrast  Result Date: 04/17/2019 CLINICAL DATA:  64 year old female with focal neurologic deficit. EXAM: CT HEAD WITHOUT CONTRAST TECHNIQUE: Contiguous axial images were obtained from the base of the skull through the vertex without intravenous contrast. COMPARISON:  None. FINDINGS: Brain: There is a large area of low attenuation with loss of gray-white matter discrimination involving the right MCA territory consistent with an acute or subacute infarct. There is associated edema and overall increased volume with associated mass effect and compression of the right cerebral sulci and right lateral ventricle. There is approximately 5 mm right to left midline shift. There is no acute intracranial hemorrhage. Vascular: Large right MCA clot measuring approximately 6 mm. There is probable occlusion of the right supraclinoid ICA. Further evaluation with CT angiography recommended. Skull: Normal. Negative for fracture or focal lesion. Sinuses/Orbits: The visualized paranasal sinuses and mastoid air cells are clear. Other: None IMPRESSION: Occlusion of the right MCA and possibly portions of the right supraclinoid ICA with a large area of right MCA territory infarct, likely acute or subacute. There is associated mass effect and approximately 5 mm right to left midline shift. No acute intracranial hemorrhage. These results were called by telephone at the time of interpretation on 04/17/2019 at 11:06 pm to Dr. Webb Silversmith who verbally acknowledged these results. Electronically Signed  By: Anner Crete M.D.   On: 04/17/2019 23:11   CT Angio Neck W and/or Wo Contrast  Result Date: 04/18/2019 CLINICAL DATA:  Follow-up examination for acute stroke. EXAM: CT ANGIOGRAPHY HEAD AND NECK TECHNIQUE: Multidetector CT imaging of the head and neck was performed using the standard protocol during bolus administration of intravenous contrast.  Multiplanar CT image reconstructions and MIPs were obtained to evaluate the vascular anatomy. Carotid stenosis measurements (when applicable) are obtained utilizing NASCET criteria, using the distal internal carotid diameter as the denominator. CONTRAST:  35mL OMNIPAQUE IOHEXOL 350 MG/ML SOLN COMPARISON:  Prior CT from 04/17/2019. FINDINGS: CTA NECK FINDINGS Aortic arch: Visualized aortic arch of normal caliber with normal 3 vessel morphology. Moderate atherosclerotic change about the arch and origin of the great vessels without hemodynamically significant stenosis. Visualized subclavian arteries widely patent. Right carotid system: Mild scattered atheromatous irregularity within the right CCA without hemodynamically significant stenosis. Possible small penetrating plaque noted at the distal right CCA (series 7, image 226). Mixed atheromatous disease noted about the right carotid bifurcation. Right ICA occluded at the bifurcation, and remains occluded within the neck. Origin of the right external carotid artery appears occluded as well, but is patent distally, suspected to be through retrograde filling. Left carotid system: Left CCA patent from its origin to the bifurcation without stenosis. Mild atheromatous irregularity about the left bifurcation without hemodynamically significant stenosis. Left ICA patent distally to the skull base without stenosis, dissection or occlusion. Vertebral arteries: Both vertebral arteries arise from the subclavian arteries. Dominant left vertebral artery widely patent within the neck. Severe atheromatous stenosis of approximately 80% seen at the origin of the right vertebral artery (series 7, image 275). Right vertebral otherwise widely patent within the neck as well. Skeleton: No acute osseous abnormality. No discrete osseous lesions. Moderate cervical spondylosis noted at C4-5 through C6-7. Other neck: No other acute soft tissue abnormality within the neck. No mass lesion or  adenopathy. Upper chest: Visualized upper chest demonstrates no acute finding. Mild centrilobular emphysema. Review of the MIP images confirms the above findings CTA HEAD FINDINGS Anterior circulation: Petrous left ICA widely patent. Mild scattered calcified plaque within the cavernous/supraclinoid left ICA without hemodynamically significant stenosis. Left ICA terminus well perfused. Left A1 patent. Normal in patent anterior communicating artery. Both A2 segments well perfused and patent to their distal aspects. Filling of the right A1 to the terminus via collateral flow. Scant attenuated opacification of the right M1 segment and distal MCA branches, likely collateral flow, either across the circle-of-Willis or retrograde in nature. Left M1 widely patent. Normal left MCA bifurcation. Distal left MCA branches well perfused. Posterior circulation: Focal atheromatous plaque at the left V4 segment as it crosses the dural reflection with short-segment moderate stenosis. Vertebral arteries otherwise widely patent to the vertebrobasilar junction. Posterior inferior cerebral arteries patent bilaterally. Basilar widely patent to its distal aspect without stenosis. Superior cerebral arteries patent bilaterally. Both PCAs well perfused to their distal aspects without stenosis. Small left posterior communicating artery noted. Venous sinuses: Patent. Anatomic variants: None significant. Review of the MIP images confirms the above findings IMPRESSION: 1. 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 Scant attenuated flow within the right MCA via collateral flow across the circle-of-Willis and/or retrograde filling. 2. Mild atheromatous irregularity about the left carotid bifurcation and left carotid siphon without hemodynamically significant stenosis. 3. Severe 80% atheromatous stenosis at the origin of the right vertebral artery. Short-segment 50% stenosis at the proximal  left V4 segment.  Posterior circulation otherwise widely patent. 4. Large evolving subacute right MCA territory infarct with associated mass effect and midline shift, stable from prior CT. 5.  Emphysema (ICD10-J43.9). Electronically Signed   By: Rise Mu M.D.   On: 04/18/2019 02:04   CT Cervical Spine Wo Contrast  Result Date: 04/17/2019 CLINICAL DATA:  Possible fall EXAM: CT CERVICAL SPINE WITHOUT CONTRAST TECHNIQUE: Multidetector CT imaging of the cervical spine was performed without intravenous contrast. Multiplanar CT image reconstructions were also generated. COMPARISON:  None. FINDINGS: Alignment: No subluxation Skull base and vertebrae: No acute fracture. No primary bone lesion or focal pathologic process. Soft tissues and spinal canal: No prevertebral fluid or swelling. No visible canal hematoma. Disc levels: Degenerative disc disease from C4-5 through C6-7. Diffuse degenerative facet disease. Upper chest: No acute findings Other: Carotid artery calcifications. IMPRESSION: Degenerative disc and facet disease.  No acute bony abnormality. Electronically Signed   By: Charlett Nose M.D.   On: 04/17/2019 22:57   DG CHEST PORT 1 VIEW  Result Date: 04/18/2019 CLINICAL DATA:  Stroke EXAM: PORTABLE CHEST 1 VIEW COMPARISON:  04/25/2015 FINDINGS: Normal heart size for technique. Negative mediastinal contours. There is no edema, consolidation, effusion, or pneumothorax. No acute osseous finding. IMPRESSION: No evidence of active disease. Electronically Signed   By: Marnee Spring M.D.   On: 04/18/2019 05:03   DG Shoulder Left  Result Date: 04/18/2019 CLINICAL DATA:  Fall with left shoulder bruising. EXAM: LEFT SHOULDER - 2+ VIEW COMPARISON:  None. FINDINGS: There is no evidence of fracture or dislocation. There is no evidence of arthropathy or other focal bone abnormality. Soft tissues are unremarkable. IMPRESSION: Negative. Electronically Signed   By: Marnee Spring M.D.   On: 04/18/2019 05:01   DG Humerus  Left  Result Date: 04/18/2019 CLINICAL DATA:  Fall 3 days ago with left shoulder bruising. EXAM: LEFT HUMERUS - 2+ VIEW COMPARISON:  None. FINDINGS: There is no evidence of acute fracture or malalignment. Lateral left fifth rib fracture which has a chronic and healed appearance. IMPRESSION: Negative. Electronically Signed   By: Marnee Spring M.D.   On: 04/18/2019 05:02   DG Hip Unilat With Pelvis 2-3 Views Left  Result Date: 04/18/2019 CLINICAL DATA:  Fall 3 days ago with bruising to the left hip. EXAM: DG HIP (WITH OR WITHOUT PELVIS) 2-3V LEFT COMPARISON:  None. FINDINGS: There is no evidence of hip fracture or dislocation. There is no evidence of arthropathy or other focal bone abnormality. IMPRESSION: Negative. Electronically Signed   By: Marnee Spring M.D.   On: 04/18/2019 05:02    PHYSICAL EXAM  Temp:  [97.8 F (36.6 C)-99.3 F (37.4 C)] 99.3 F (37.4 C) (01/13 0800) Pulse Rate:  [82-118] 85 (01/13 1000) Resp:  [13-37] 26 (01/13 1000) BP: (138-190)/(58-120) 162/71 (01/13 1000) SpO2:  [94 %-100 %] 98 % (01/13 1000) Weight:  [70.1 kg] 70.1 kg (01/13 0200)  General - Well nourished, well developed, mildly agitated.  Ophthalmologic - fundi not visualized due to noncooperation.  Cardiovascular - Regular rhythm and rate.  Neuro - awake alert, orientated to self, age, time and place. Mildly agitated, asking to get up to work today (she works at home). Fluent language but moderate to sever dysarthia, follows commands on the right, hemineglect to the left, anosognosia. Eyes right gaze deviation, barely cross midline. Not blinking to visual threat on the left, left facial droop, left eye closing less strong than the right. Tongue midline. LUE no spontaneous movement,  on pain extension posturing. LLE no spontaneous movement, slight withdraw to pain. RUE and RLE 5/5, following commands. Left hemisensory neglect, able to feel pain stimulation. Left babinski positive. Sensation, coordination  and gait not tested.   ASSESSMENT/PLAN Ms. Angela KentCatherine Alvarez is a 64 y.o. female with history of peripheral vascular disease status post toe amputations, headaches, COPD found down at home, presenting with L sided weakness and speech difficulties.   Stroke:   Large R MCA territory infarct, embolic pattern, could be secondary to large vessel disease source (athero vs. dissection). However, cardioembolic source can not completely ruled out   CT head R MCA occlusion and portions of R supraclinoid ICA w/ large R MCA territory infarct. Mass effect w/ 5mm midline shift.   CTA head & neck R ICA occlusion at bifurcation to terminus. Right MCA occlusion with scant collateral flow. Mild L ICA bifurcation atherosclerosis. Severe R VA origin 80% stenosis. L V4 at 50% stenosis. Large evolving R MCA territory infarct w/ mass effect and midline shift. Emphysema.   MRI not able to perform this time due to agitation  Repeat CT in am pending  2D Echo pending  LE venous doppler pending  Consider 30 day cardiac event monitoring as outpt if work up neg  LDL 77  HgbA1c 5.9  HIV pending   SCDs for VTE prophylaxis - add lovenox   aspirin 81 mg daily and Xarelto (rivaroxaban) daily prior to admission, now on aspirin 300 mg suppository daily. Consider DAPT once po access  Therapy recommendations:  pending   Disposition:  pending   Cerebral Edema Induced Hypernatremia  Started on 3% protocol  3% @ 75 via PIV  Na 141-144-148   Na goal 150-155  Check Na q 6h  Repeat CT in am  HTN  Home meds:  none  BP slightly elevated 140-190s . Permissive hypertension (OK if < 220/120) but gradually normalize in 3-5 days . Long-term BP goal 130-150 given right ICA and MCA occlusion  Hyperlipidemia  Home meds:  lipitor 40 and zetia 10  LDL 77, goal < 70  Resume meds once po access  Continue statin at discharge  Dysphagia . Secondary to stroke . NPO . Speech on board . On 3% saline for  IVF . May consider cortrak if failed swallow   Other Stroke Risk Factors  Former Cigarette smoker, quit 3 yrs ago  Family hx stroke (mother at age 65)  Migraines  PVD s/p toe amputation (R 1st toe) - was on ASA 81 and Xarelto 2.5 daily PTA  Other Active Problems  COPD  Leukocytosis 12.7 (afebrile). CXR ok. UA pending.  Hospital day # 0  This patient is critically ill due to large right MCA stroke, right ICA and MCA occlusion, HTN, dysphagia and at significant risk of neurological worsening, death form recurrent stroke, cerebral edema, brain herniation, aspiration pneumonia, respiratory failure. This patient's care requires constant monitoring of vital signs, hemodynamics, respiratory and cardiac monitoring, review of multiple databases, neurological assessment, discussion with family, other specialists and medical decision making of high complexity. I spent 40 minutes of neurocritical care time in the care of this patient.  Marvel PlanJindong Alsace Dowd, MD PhD Stroke Neurology 04/18/2019 10:34 AM   To contact Stroke Continuity provider, please refer to [email or web address]. After hours, contact General Neurology

## 2019-04-19 ENCOUNTER — Inpatient Hospital Stay (HOSPITAL_COMMUNITY): Payer: 59

## 2019-04-19 DIAGNOSIS — I639 Cerebral infarction, unspecified: Secondary | ICD-10-CM

## 2019-04-19 DIAGNOSIS — E876 Hypokalemia: Secondary | ICD-10-CM

## 2019-04-19 LAB — CBC
HCT: 42.1 % (ref 36.0–46.0)
Hemoglobin: 12.9 g/dL (ref 12.0–15.0)
MCH: 27.1 pg (ref 26.0–34.0)
MCHC: 30.6 g/dL (ref 30.0–36.0)
MCV: 88.4 fL (ref 80.0–100.0)
Platelets: 380 10*3/uL (ref 150–400)
RBC: 4.76 MIL/uL (ref 3.87–5.11)
RDW: 14.7 % (ref 11.5–15.5)
WBC: 9.6 10*3/uL (ref 4.0–10.5)
nRBC: 0 % (ref 0.0–0.2)

## 2019-04-19 LAB — BASIC METABOLIC PANEL
Anion gap: 13 (ref 5–15)
BUN: 13 mg/dL (ref 8–23)
CO2: 24 mmol/L (ref 22–32)
Calcium: 8.7 mg/dL — ABNORMAL LOW (ref 8.9–10.3)
Chloride: 123 mmol/L — ABNORMAL HIGH (ref 98–111)
Creatinine, Ser: 0.79 mg/dL (ref 0.44–1.00)
GFR calc Af Amer: >60 mL/min (ref >60)
GFR calc non Af Amer: >60 mL/min (ref >60)
Glucose, Bld: 106 mg/dL — ABNORMAL HIGH (ref 70–99)
Potassium: 3.2 mmol/L — ABNORMAL LOW (ref 3.5–5.1)
Sodium: 160 mmol/L — ABNORMAL HIGH (ref 135–145)

## 2019-04-19 LAB — SODIUM
Sodium: 158 mmol/L — ABNORMAL HIGH (ref 135–145)
Sodium: 159 mmol/L — ABNORMAL HIGH (ref 135–145)
Sodium: 159 mmol/L — ABNORMAL HIGH (ref 135–145)

## 2019-04-19 MED ORDER — POTASSIUM CHLORIDE 10 MEQ/100ML IV SOLN
10.0000 meq | INTRAVENOUS | Status: DC
  Administered 2019-04-19 (×6): 10 meq via INTRAVENOUS
  Filled 2019-04-19 (×6): qty 100

## 2019-04-19 MED ORDER — SODIUM CHLORIDE 0.9 % IV SOLN: INTRAVENOUS | Status: DC

## 2019-04-19 MED ORDER — SODIUM CHLORIDE 0.9 % IV SOLN
INTRAVENOUS | Status: DC | PRN
  Administered 2019-04-19: 250 mL via INTRAVENOUS

## 2019-04-19 NOTE — Progress Notes (Signed)
Updated Neuro MD Wilford Corner about patient's 0006 sodium level of 159. Verbal order to stop 3% hypertonic saline. Will continue to monitor.

## 2019-04-19 NOTE — Progress Notes (Signed)
Inpatient Rehab Admissions:  Inpatient Rehab Consult received.  I met with patient at the bedside for rehabilitation assessment and to discuss goals and expectations of an inpatient rehab admission.  She keeps her eyes closed and is not oriented except to name.  She indicated that her family was waiting right out side (they were not) and agreed that I could speak with them to discuss dispo.  Left voicemail for pt's sister, Vaughan Basta.   Signed: Shann Medal, PT, DPT Admissions Coordinator [phone] 04/19/19  11:09 AM

## 2019-04-19 NOTE — Progress Notes (Signed)
Physical Therapy Treatment Patient Details Name: Angela Alvarez MRN: [id number] DOB: 02-Jul-1955 Today's Date: 04/19/2019    History of Present Illness Pt is a 64 yo female s/p L side weakness with severe dysarthria, L hemineglect and anosognosia.   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    PT Comments    Pt's awareness and distractibility similar to last session.  She has little awareness of where her body is during mobility and will need time to learn cues and commands.  Emphasis on awareness activity during ROM exercise, transition to EOB, longer time working on truncal control at EOB, sit to stand and 3 transfers to accommodate toileting.    Follow Up Recommendations  CIR;Supervision/Assistance - 24 hour     Equipment Recommendations  Other (comment)(TBA next venue)    Recommendations for Other Services Rehab consult     Precautions / Restrictions Precautions Precautions: Fall;Other (comment) Precaution Comments: L hemiplegia    Mobility  Bed Mobility Overal bed mobility: Needs Assistance Bed Mobility: Sidelying to Sit;Rolling Rolling: Max assist Sidelying to sit: Max assist       General bed mobility comments: truncal assist and direction.  hand over hand direction of R UE to assist.  pt needed maximal assist to coordinate scoot to the EOB  Transfers Overall transfer level: Needs assistance Equipment used: None Transfers: Sit to/from [employer] Transfers Sit to Stand: Max assist;+2 physical assistance;+2 safety/equipment Stand pivot transfers: Max assist;+2 physical assistance;+2 safety/equipment       General transfer comment: cues for hand placement and posture set up, but pt unable to follow and hold positionin.  face to face assist to come forward and stand/ transfer.  Transferred to [facility], then back to bed before transfer bed to chair due to pt unable to pivot more than ~90*, unable  to bear weight on the L LE.  Ambulation/Gait             General Gait Details: unable   Stairs             Wheelchair Mobility    Modified Rankin (Stroke Patients Only) Modified Rankin (Stroke Patients Only) Pre-Morbid Rankin Score: No symptoms Modified Rankin: Severe disability     Balance Overall balance assessment: Needs assistance Sitting-balance support: Single extremity supported;No upper extremity supported;Feet supported Sitting balance-Leahy Scale: Poor Sitting balance - Comments: pt has difficulty finding midline or maintaining without significant assist.  Pt either lists L and/or forward, head down, but was able to follow cues and facilitation better to find upright midline better. Spent >5 minutes working on helping pt find/maintain/acknowledge midline.   Standing balance support: Single extremity supported;During functional activity Standing balance-Leahy Scale: Poor Standing balance comment: full balance support on L LE, assist coordination of R side assist.                            Cognition Arousal/Alertness: Awake/alert Behavior During Therapy: Restless;Flat affect Overall Cognitive Status: Impaired/Different from baseline Area of Impairment: Orientation;Attention;Following commands;Safety/judgement;Awareness;Problem solving                 Orientation Level: Situation;Time Current Attention Level: Focused;Sustained   Following Commands: Follows one step commands inconsistently;Follows one step commands with increased time Safety/Judgement: Decreased awareness of safety;Decreased awareness of deficits Awareness: Intellectual Problem Solving: Slow processing;Decreased initiation;Difficulty sequencing;Requires verbal cues;Requires tactile cues General Comments: Pt performing 50-75% of commands,  but often required redirection to complete task due to very poor attention.      Exercises Other Exercises Other Exercises: warm up  bil LE ROM prior to mobility.  Graded resistance on the R LE and facilitation to get activation on the L LE.    General Comments General comments (skin integrity, edema, etc.): vss.  Pt has significant awareness difficulties and requires significant control by the therapist .      Pertinent Vitals/Pain Pain Assessment: Faces Faces Pain Scale: Hurts little more Pain Location: R foot Pain Descriptors / Indicators: Discomfort Pain Intervention(s): Limited activity within patient's tolerance    Home Living                      Prior Function            PT Goals (current goals can now be found in the care plan section) Acute Rehab PT Goals PT Goal Formulation: With patient Time For Goal Achievement: 05/02/19 Potential to Achieve Goals: Fair Progress towards PT goals: Progressing toward goals    Frequency    Min 3X/week      PT Plan Current plan remains appropriate    Co-evaluation              AM-PAC PT "6 Clicks" Mobility   Outcome Measure  Help needed turning from your back to your side while in a flat bed without using bedrails?: Total Help needed moving from lying on your back to sitting on the side of a flat bed without using bedrails?: Total Help needed moving to and from a bed to a chair (including a wheelchair)?: Total Help needed standing up from a chair using your arms (e.g., wheelchair or bedside chair)?: Total Help needed to walk in hospital room?: Total Help needed climbing 3-5 steps with a railing? : Total 6 Click Score: 6    End of Session   Activity Tolerance: Patient tolerated treatment well;Patient limited by fatigue Patient left: in chair;with call bell/phone within reach;with chair alarm set("posey" alarm for more slide control and pt can release.) Nurse Communication: Mobility status PT Visit Diagnosis: Other abnormalities of gait and mobility (R26.89);Difficulty in walking, not elsewhere classified (R26.2);Hemiplegia and  hemiparesis Hemiplegia - Right/Left: Left Hemiplegia - dominant/non-dominant: Non-dominant Hemiplegia - caused by: Cerebral infarction     Time: 8676-7209 PT Time Calculation (min) (ACUTE ONLY): 33 min  Charges:  $Therapeutic Activity: 8-22 mins $Neuromuscular Re-education: 8-22 mins                     04/19/2019  Ginger Carne., PT Acute Rehabilitation Services [phone]  (pager) [phone]  (office)   Tessie Fass Ferrin Liebig 04/19/2019, 6:13 PM

## 2019-04-19 NOTE — Progress Notes (Signed)
  Speech Language Pathology Treatment: Dysphagia  Patient Details Name: Angela Alvarez MRN: [id number] DOB: 03/15/1956 Today's Date: 04/19/2019 Time: 0900-0910 SLP Time Calculation (min) (ACUTE ONLY): 10 min  Assessment / Plan / Recommendation Clinical Impression  Pt seen at bedside for assessment of appropriateness for MBS. Pt is awake and alert, answers questions, and asks for water. Oral cavity noted to be significantly dry. Oral care completed with suction. Will complete MBS this date at 1130. RN and MD aware. Report to follow.     HPI HPI: Pt is a 64 y.o. female found down at home, presenting with L sided weakness and dysarthria. CT showed large R MCA territory infarct with 76mm midline shift; MRI pending. PMH: PVD s/p toe amputations, headaches, COPD       SLP Plan  MBS @ 1130       Recommendations   pending MBS                Oral Care Recommendations: Oral care QID Follow up Recommendations: Inpatient Rehab SLP Visit Diagnosis: Dysphagia, unspecified (R13.10) Plan: MBS       Harlow Asa, MSP, CCC-SLP Speech Language Pathologist Office: [phone] Pager: [phone]  Leigh Aurora 04/19/2019, 9:13 AM

## 2019-04-19 NOTE — Progress Notes (Signed)
STROKE TEAM PROGRESS NOTE   INTERVAL HISTORY Patient RN at bedside, patient mildly agitated, but still orientated fully.  Complaining of thirsty, however did not pass swallow, did not pass modified barium study.  Kept NPO.  Sodium 160, hypertonic saline discontinued, on normal saline.  Will need a core track in a.m.  CT repeat no worsening midline shift.  Vitals:   04/19/19 0500 04/19/19 0600 04/19/19 0700 04/19/19 0800  BP: (!) 181/106 (!) 178/107 (!) 186/73 (!) 172/72  Pulse: 95 95 77 82  Resp: (!) 22 20 16 19   Temp:   99.4 F (37.4 C)   TempSrc:   Axillary   SpO2: 99% 97% 95% 96%  Weight:      Height:        CBC:  Recent Labs  Lab 04/17/19 2204 04/19/19 0615  WBC 12.7* 9.6  NEUTROABS 10.1*  --   HGB 13.5 12.9  HCT 43.7 42.1  MCV 87.4 88.4  PLT 422* 380    Basic Metabolic Panel:  Recent Labs  Lab 04/17/19 2204 04/18/19 0230 04/19/19 0006 04/19/19 0615  NA 141   < > 159* 160*  K 3.8  --   --  3.2*  CL 106  --   --  123*  CO2 24  --   --  24  GLUCOSE 127*  --   --  106*  BUN 25*  --   --  13  CREATININE 0.83  --   --  0.79  CALCIUM 8.9  --   --  8.7*   < > = values in this interval not displayed.   Lipid Panel:     Component Value Date/Time   CHOL 140 04/18/2019 0245   TRIG 88 04/18/2019 0245   HDL 45 04/18/2019 0245   CHOLHDL 3.1 04/18/2019 0245   VLDL 18 04/18/2019 0245   LDLCALC 77 04/18/2019 0245   HgbA1c:  Lab Results  Component Value Date   HGBA1C 5.9 (H) 04/18/2019   Urine Drug Screen: No results found for: LABOPIA, COCAINSCRNUR, LABBENZ, AMPHETMU, THCU, LABBARB  Alcohol Level     Component Value Date/Time   ETH <10 04/17/2019 2204    IMAGING past 48 hours CT Angio Head W or Wo Contrast  Result Date: 04/18/2019 CLINICAL DATA:  Follow-up examination for acute stroke. EXAM: CT ANGIOGRAPHY HEAD AND NECK TECHNIQUE: Multidetector CT imaging of the head and neck was performed using the standard protocol during bolus administration of  intravenous contrast. Multiplanar CT image reconstructions and MIPs were obtained to evaluate the vascular anatomy. Carotid stenosis measurements (when applicable) are obtained utilizing NASCET criteria, using the distal internal carotid diameter as the denominator. CONTRAST:  62mL OMNIPAQUE IOHEXOL 350 MG/ML SOLN COMPARISON:  Prior CT from 04/17/2019. FINDINGS: CTA NECK FINDINGS Aortic arch: Visualized aortic arch of normal caliber with normal 3 vessel morphology. Moderate atherosclerotic change about the arch and origin of the great vessels without hemodynamically significant stenosis. Visualized subclavian arteries widely patent. Right carotid system: Mild scattered atheromatous irregularity within the right CCA without hemodynamically significant stenosis. Possible small penetrating plaque noted at the distal right CCA (series 7, image 226). Mixed atheromatous disease noted about the right carotid bifurcation. Right ICA occluded at the bifurcation, and remains occluded within the neck. Origin of the right external carotid artery appears occluded as well, but is patent distally, suspected to be through retrograde filling. Left carotid system: Left CCA patent from its origin to the bifurcation without stenosis. Mild atheromatous irregularity about the left  bifurcation without hemodynamically significant stenosis. Left ICA patent distally to the skull base without stenosis, dissection or occlusion. Vertebral arteries: Both vertebral arteries arise from the subclavian arteries. Dominant left vertebral artery widely patent within the neck. Severe atheromatous stenosis of approximately 80% seen at the origin of the right vertebral artery (series 7, image 275). Right vertebral otherwise widely patent within the neck as well. Skeleton: No acute osseous abnormality. No discrete osseous lesions. Moderate cervical spondylosis noted at C4-5 through C6-7. Other neck: No other acute soft tissue abnormality within the neck. No  mass lesion or adenopathy. Upper chest: Visualized upper chest demonstrates no acute finding. Mild centrilobular emphysema. Review of the MIP images confirms the above findings CTA HEAD FINDINGS Anterior circulation: Petrous left ICA widely patent. Mild scattered calcified plaque within the cavernous/supraclinoid left ICA without hemodynamically significant stenosis. Left ICA terminus well perfused. Left A1 patent. Normal in patent anterior communicating artery. Both A2 segments well perfused and patent to their distal aspects. Filling of the right A1 to the terminus via collateral flow. Scant attenuated opacification of the right M1 segment and distal MCA branches, likely collateral flow, either across the circle-of-Willis or retrograde in nature. Left M1 widely patent. Normal left MCA bifurcation. Distal left MCA branches well perfused. Posterior circulation: Focal atheromatous plaque at the left V4 segment as it crosses the dural reflection with short-segment moderate stenosis. Vertebral arteries otherwise widely patent to the vertebrobasilar junction. Posterior inferior cerebral arteries patent bilaterally. Basilar widely patent to its distal aspect without stenosis. Superior cerebral arteries patent bilaterally. Both PCAs well perfused to their distal aspects without stenosis. Small left posterior communicating artery noted. Venous sinuses: Patent. Anatomic variants: None significant. Review of the MIP images confirms the above findings IMPRESSION: 1. 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 Scant attenuated flow within the right MCA via collateral flow across the circle-of-Willis and/or retrograde filling. 2. Mild atheromatous irregularity about the left carotid bifurcation and left carotid siphon without hemodynamically significant stenosis. 3. Severe 80% atheromatous stenosis at the origin of the right vertebral artery. Short-segment 50% stenosis at the proximal  left V4 segment. Posterior circulation otherwise widely patent. 4. Large evolving subacute right MCA territory infarct with associated mass effect and midline shift, stable from prior CT. 5.  Emphysema (ICD10-J43.9). Electronically Signed   By: Rise Mu M.D.   On: 04/18/2019 02:04   CT HEAD WO CONTRAST  Result Date: 04/19/2019 CLINICAL DATA:  Stroke follow-up EXAM: CT HEAD WITHOUT CONTRAST TECHNIQUE: Contiguous axial images were obtained from the base of the skull through the vertex without intravenous contrast. COMPARISON:  Two days ago FINDINGS: Brain: Cytotoxic edema in the right MCA territory with unchanged extent. Local mass effect with midline shift measuring 5 mm. Subcentimeter globular high-density along the lower right sylvian fissure with no enhancing aneurysm seen on prior CTA, presumably a small focus of petechial hemorrhage. No new site of infarct. Vascular: As above Skull: Negative Sinuses/Orbits: Negative IMPRESSION: Right MCA infarct is unchanged in extent with up to 5 mm of midline shift. Mild petechial hemorrhage is stable. Electronically Signed   By: Marnee Spring M.D.   On: 04/19/2019 05:10   CT Head Wo Contrast  Result Date: 04/17/2019 CLINICAL DATA:  64 year old female with focal neurologic deficit. EXAM: CT HEAD WITHOUT CONTRAST TECHNIQUE: Contiguous axial images were obtained from the base of the skull through the vertex without intravenous contrast. COMPARISON:  None. FINDINGS: Brain: There is a large area of  low attenuation with loss of gray-white matter discrimination involving the right MCA territory consistent with an acute or subacute infarct. There is associated edema and overall increased volume with associated mass effect and compression of the right cerebral sulci and right lateral ventricle. There is approximately 5 mm right to left midline shift. There is no acute intracranial hemorrhage. Vascular: Large right MCA clot measuring approximately 6 mm. There is  probable occlusion of the right supraclinoid ICA. Further evaluation with CT angiography recommended. Skull: Normal. Negative for fracture or focal lesion. Sinuses/Orbits: The visualized paranasal sinuses and mastoid air cells are clear. Other: None IMPRESSION: Occlusion of the right MCA and possibly portions of the right supraclinoid ICA with a large area of right MCA territory infarct, likely acute or subacute. There is associated mass effect and approximately 5 mm right to left midline shift. No acute intracranial hemorrhage. These results were called by telephone at the time of interpretation on 04/17/2019 at 11:06 pm to Dr. Webb Silversmitheglar who verbally acknowledged these results. Electronically Signed   By: Elgie CollardArash  Radparvar M.D.   On: 04/17/2019 23:11   CT Angio Neck W and/or Wo Contrast  Result Date: 04/18/2019 CLINICAL DATA:  Follow-up examination for acute stroke. EXAM: CT ANGIOGRAPHY HEAD AND NECK TECHNIQUE: Multidetector CT imaging of the head and neck was performed using the standard protocol during bolus administration of intravenous contrast. Multiplanar CT image reconstructions and MIPs were obtained to evaluate the vascular anatomy. Carotid stenosis measurements (when applicable) are obtained utilizing NASCET criteria, using the distal internal carotid diameter as the denominator. CONTRAST:  75mL OMNIPAQUE IOHEXOL 350 MG/ML SOLN COMPARISON:  Prior CT from 04/17/2019. FINDINGS: CTA NECK FINDINGS Aortic arch: Visualized aortic arch of normal caliber with normal 3 vessel morphology. Moderate atherosclerotic change about the arch and origin of the great vessels without hemodynamically significant stenosis. Visualized subclavian arteries widely patent. Right carotid system: Mild scattered atheromatous irregularity within the right CCA without hemodynamically significant stenosis. Possible small penetrating plaque noted at the distal right CCA (series 7, image 226). Mixed atheromatous disease noted about the  right carotid bifurcation. Right ICA occluded at the bifurcation, and remains occluded within the neck. Origin of the right external carotid artery appears occluded as well, but is patent distally, suspected to be through retrograde filling. Left carotid system: Left CCA patent from its origin to the bifurcation without stenosis. Mild atheromatous irregularity about the left bifurcation without hemodynamically significant stenosis. Left ICA patent distally to the skull base without stenosis, dissection or occlusion. Vertebral arteries: Both vertebral arteries arise from the subclavian arteries. Dominant left vertebral artery widely patent within the neck. Severe atheromatous stenosis of approximately 80% seen at the origin of the right vertebral artery (series 7, image 275). Right vertebral otherwise widely patent within the neck as well. Skeleton: No acute osseous abnormality. No discrete osseous lesions. Moderate cervical spondylosis noted at C4-5 through C6-7. Other neck: No other acute soft tissue abnormality within the neck. No mass lesion or adenopathy. Upper chest: Visualized upper chest demonstrates no acute finding. Mild centrilobular emphysema. Review of the MIP images confirms the above findings CTA HEAD FINDINGS Anterior circulation: Petrous left ICA widely patent. Mild scattered calcified plaque within the cavernous/supraclinoid left ICA without hemodynamically significant stenosis. Left ICA terminus well perfused. Left A1 patent. Normal in patent anterior communicating artery. Both A2 segments well perfused and patent to their distal aspects. Filling of the right A1 to the terminus via collateral flow. Scant attenuated opacification of the right M1 segment and  distal MCA branches, likely collateral flow, either across the circle-of-Willis or retrograde in nature. Left M1 widely patent. Normal left MCA bifurcation. Distal left MCA branches well perfused. Posterior circulation: Focal atheromatous plaque  at the left V4 segment as it crosses the dural reflection with short-segment moderate stenosis. Vertebral arteries otherwise widely patent to the vertebrobasilar junction. Posterior inferior cerebral arteries patent bilaterally. Basilar widely patent to its distal aspect without stenosis. Superior cerebral arteries patent bilaterally. Both PCAs well perfused to their distal aspects without stenosis. Small left posterior communicating artery noted. Venous sinuses: Patent. Anatomic variants: None significant. Review of the MIP images confirms the above findings IMPRESSION: 1. 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 Scant attenuated flow within the right MCA via collateral flow across the circle-of-Willis and/or retrograde filling. 2. Mild atheromatous irregularity about the left carotid bifurcation and left carotid siphon without hemodynamically significant stenosis. 3. Severe 80% atheromatous stenosis at the origin of the right vertebral artery. Short-segment 50% stenosis at the proximal left V4 segment. Posterior circulation otherwise widely patent. 4. Large evolving subacute right MCA territory infarct with associated mass effect and midline shift, stable from prior CT. 5.  Emphysema (ICD10-J43.9). Electronically Signed   By: Jeannine Boga M.D.   On: 04/18/2019 02:04   CT Cervical Spine Wo Contrast  Result Date: 04/17/2019 CLINICAL DATA:  Possible fall EXAM: CT CERVICAL SPINE WITHOUT CONTRAST TECHNIQUE: Multidetector CT imaging of the cervical spine was performed without intravenous contrast. Multiplanar CT image reconstructions were also generated. COMPARISON:  None. FINDINGS: Alignment: No subluxation Skull base and vertebrae: No acute fracture. No primary bone lesion or focal pathologic process. Soft tissues and spinal canal: No prevertebral fluid or swelling. No visible canal hematoma. Disc levels: Degenerative disc disease from C4-5 through C6-7. Diffuse  degenerative facet disease. Upper chest: No acute findings Other: Carotid artery calcifications. IMPRESSION: Degenerative disc and facet disease.  No acute bony abnormality. Electronically Signed   By: Rolm Baptise M.D.   On: 04/17/2019 22:57   DG CHEST PORT 1 VIEW  Result Date: 04/18/2019 CLINICAL DATA:  Stroke EXAM: PORTABLE CHEST 1 VIEW COMPARISON:  04/25/2015 FINDINGS: Normal heart size for technique. Negative mediastinal contours. There is no edema, consolidation, effusion, or pneumothorax. No acute osseous finding. IMPRESSION: No evidence of active disease. Electronically Signed   By: Monte Fantasia M.D.   On: 04/18/2019 05:03   DG Shoulder Left  Result Date: 04/18/2019 CLINICAL DATA:  Fall with left shoulder bruising. EXAM: LEFT SHOULDER - 2+ VIEW COMPARISON:  None. FINDINGS: There is no evidence of fracture or dislocation. There is no evidence of arthropathy or other focal bone abnormality. Soft tissues are unremarkable. IMPRESSION: Negative. Electronically Signed   By: Monte Fantasia M.D.   On: 04/18/2019 05:01   DG Humerus Left  Result Date: 04/18/2019 CLINICAL DATA:  Fall 3 days ago with left shoulder bruising. EXAM: LEFT HUMERUS - 2+ VIEW COMPARISON:  None. FINDINGS: There is no evidence of acute fracture or malalignment. Lateral left fifth rib fracture which has a chronic and healed appearance. IMPRESSION: Negative. Electronically Signed   By: Monte Fantasia M.D.   On: 04/18/2019 05:02   ECHOCARDIOGRAM COMPLETE  Result Date: 04/18/2019   ECHOCARDIOGRAM REPORT   Patient Name:   HARLEI LEHRMANN Date of Exam: 04/18/2019 Medical Rec #:  [id number]         Height:       66.0 in Accession #:    [id number]  Weight:       154.5 lb Date of Birth:  09-19-1955         BSA:          1.79 m Patient Age:    63 years          BP:           168/59 mmHg Patient Gender: F                 HR:           92 bpm. Exam Location:  Inpatient Procedure: 2D Echo Indications:    Stroke 434.91 / I163.9   History:        Patient has no prior history of Echocardiogram examinations.                 Stroke and COPD, Signs/Symptoms:Dyspnea; Risk Factors:Former                 Smoker.  Sonographer:    Jeryl Columbia Referring Phys: [id number] ASHISH ARORA IMPRESSIONS  1. Left ventricular ejection fraction, by visual estimation, is 50 to 55%. The left ventricle has mildly decreased function. There is no left ventricular hypertrophy.  2. Left ventricular diastolic parameters are consistent with Grade I diastolic dysfunction (impaired relaxation).  3. Mildly dilated left ventricular internal cavity size.  4. The left ventricle demonstrates global hypokinesis.  5. Global right ventricle has normal systolic function.The right ventricular size is normal. No increase in right ventricular wall thickness.  6. Left atrial size was normal.  7. Right atrial size was normal.  8. Mild mitral annular calcification.  9. The mitral valve is normal in structure. No evidence of mitral valve regurgitation. No evidence of mitral stenosis. 10. The tricuspid valve is normal in structure. 11. The aortic valve was not well visualized. Aortic valve regurgitation is not visualized. Mild aortic valve stenosis with mean gradient 10 mmHg. 12. TR signal is inadequate for assessing pulmonary artery systolic pressure. 13. The inferior vena cava is normal in size with greater than 50% respiratory variability, suggesting right atrial pressure of 3 mmHg. 14. Trivial pericardial effusion is present. FINDINGS  Left Ventricle: Left ventricular ejection fraction, by visual estimation, is 50 to 55%. The left ventricle has mildly decreased function. The left ventricle demonstrates global hypokinesis. The left ventricular internal cavity size was mildly dilated left ventricle. There is no left ventricular hypertrophy. Left ventricular diastolic parameters are consistent with Grade I diastolic dysfunction (impaired relaxation). Right Ventricle: The right ventricular  size is normal. No increase in right ventricular wall thickness. Global RV systolic function is has normal systolic function. Left Atrium: Left atrial size was normal in size. Right Atrium: Right atrial size was normal in size Pericardium: Trivial pericardial effusion is present. Mitral Valve: The mitral valve is normal in structure. Mild mitral annular calcification. No evidence of mitral valve regurgitation. No evidence of mitral valve stenosis by observation. Tricuspid Valve: The tricuspid valve is normal in structure. Tricuspid valve regurgitation is not demonstrated. Aortic Valve: The aortic valve was not well visualized. Aortic valve regurgitation is not visualized. Mild aortic stenosis is present. Aortic valve mean gradient measures 10.0 mmHg. Aortic valve peak gradient measures 15.8 mmHg. Aortic valve area, by VTI  measures 1.28 cm. Pulmonic Valve: The pulmonic valve was normal in structure. Pulmonic valve regurgitation is not visualized. Pulmonic regurgitation is not visualized. Aorta: The aortic root is normal in size and structure. Venous: The inferior vena cava is normal in size  with greater than 50% respiratory variability, suggesting right atrial pressure of 3 mmHg. IAS/Shunts: No atrial level shunt detected by color flow Doppler.  LEFT VENTRICLE PLAX 2D LVIDd:         5.40 cm  Diastology LVIDs:         4.00 cm  LV e' lateral:   6.53 cm/s LV PW:         1.10 cm  LV E/e' lateral: 12.0 LV IVS:        1.10 cm  LV e' medial:    11.30 cm/s LVOT diam:     2.00 cm  LV E/e' medial:  7.0 LV SV:         71 ml LV SV Index:   39.25 LVOT Area:     3.14 cm  RIGHT VENTRICLE RV S prime:     15.10 cm/s TAPSE (M-mode): 2.4 cm LEFT ATRIUM             Index LA diam:        3.40 cm 1.90 cm/m LA Vol (A2C):   46.8 ml 26.11 ml/m LA Vol (A4C):   53.2 ml 29.68 ml/m LA Biplane Vol: 49.8 ml 27.79 ml/m  AORTIC VALVE AV Area (Vmax):    1.19 cm AV Area (Vmean):   1.04 cm AV Area (VTI):     1.28 cm AV Vmax:           199.00  cm/s AV Vmean:          141.667 cm/s AV VTI:            0.421 m AV Peak Grad:      15.8 mmHg AV Mean Grad:      10.0 mmHg LVOT Vmax:         75.37 cm/s LVOT Vmean:        46.700 cm/s LVOT VTI:          0.172 m LVOT/AV VTI ratio: 0.41  AORTA Ao Root diam: 2.80 cm MITRAL VALVE MV Area (PHT): 5.02 cm              SHUNTS MV PHT:        43.79 msec            Systemic VTI:  0.17 m MV Decel Time: 151 msec              Systemic Diam: 2.00 cm MV E velocity: 78.60 cm/s  103 cm/s MV A velocity: 123.00 cm/s 70.3 cm/s MV E/A ratio:  0.64        1.5  Marca Anconaalton Mclean MD Electronically signed by Marca Anconaalton Mclean MD Signature Date/Time: 04/18/2019/1:59:39 PM    Final    DG Hip Unilat With Pelvis 2-3 Views Left  Result Date: 04/18/2019 CLINICAL DATA:  Fall 3 days ago with bruising to the left hip. EXAM: DG HIP (WITH OR WITHOUT PELVIS) 2-3V LEFT COMPARISON:  None. FINDINGS: There is no evidence of hip fracture or dislocation. There is no evidence of arthropathy or other focal bone abnormality. IMPRESSION: Negative. Electronically Signed   By: Marnee SpringJonathon  Watts M.D.   On: 04/18/2019 05:02    PHYSICAL EXAM    Temp:  [97.7 F (36.5 C)-100.4 F (38 C)] 99.4 F (37.4 C) (01/14 0700) Pulse Rate:  [70-105] 82 (01/14 0800) Resp:  [15-26] 19 (01/14 0800) BP: (145-198)/(54-117) 172/72 (01/14 0800) SpO2:  [92 %-99 %] 96 % (01/14 0800)  General - Well nourished, well developed, mildly agitated.  Ophthalmologic - fundi not  visualized due to noncooperation.  Cardiovascular - Regular rhythm and rate.  Neuro - awake alert, orientated to self, age, time and place. Fluent language but moderate to sever dysarthia, follows commands on the right, hemineglect to the left, anosognosia. Eyes right gaze deviation, barely cross midline. Not blinking to visual threat on the left, left facial droop, left eye closing less strong than the right. Tongue midline. LUE no spontaneous movement, on pain extension posturing. LLE no spontaneous movement,  slight withdraw to pain. RUE and RLE 5/5, following commands. Left hemisensory neglect, able to react to pain stimulation. Left babinski positive. Sensation, coordination and gait not tested.   ASSESSMENT/PLAN Ms. Shalece Staffa is a 64 y.o. female with history of peripheral vascular disease status post toe amputations, headaches, COPD found down at home, presenting with L sided weakness and speech difficulties.   Stroke:   Large R MCA territory infarct, embolic pattern, could be secondary to large vessel disease source (athero vs. dissection). However, cardioembolic source can not completely ruled out   CT head R MCA occlusion and portions of R supraclinoid ICA w/ large R MCA territory infarct. Mass effect w/ 5mm midline shift.   CTA head & neck R ICA occlusion at bifurcation to terminus. Right MCA occlusion with scant collateral flow. Mild L ICA bifurcation atherosclerosis. Severe R VA origin 80% stenosis. L V4 at 50% stenosis. Large evolving R MCA territory infarct w/ mass effect and midline shift. Emphysema.   MRI not able to perform this time due to agitation  Repeat CT 1/14 R MCA infarct w/ stable 5mm midline shift, mild petechial hemorrhage  2D Echo EF 50-55%. No source of embolus   LE venous doppler No DVT   Consider 30 day cardiac event monitoring as outpt if work up neg  LDL 77  HgbA1c 5.9  Lovenox 40 mg sq daily for VTE prophylaxis   aspirin 81 mg daily and Xarelto (rivaroxaban) daily prior to admission, now on aspirin 300 mg suppository daily. Consider DAPT once po access  Therapy recommendations:  CIR  Disposition:  pending   Cerebral Edema Induced Hypernatremia  Started on 3% protocol  3% @ 75 via PIV -> 22ml->10mL->held d/t elevation and started NS at 75/h  Na 141-144-148-159-159-160   Na goal 150-155  Check Na q 6h  Repeat CT 1/14 stable MLS  HTN  Home meds:  none  BP slightly elevated 140-190s . Permissive hypertension (OK if < 220/120) but  gradually normalize in 3-5 days . Long-term BP goal 130-150 given right ICA and MCA occlusion  Hyperlipidemia  Home meds:  lipitor 40 and zetia 10  LDL 77, goal < 70  Resume meds once po access  Continue statin at discharge  Dysphagia . Secondary to stroke . NPO . Speech on board . On 3% saline for IVF . Failed MBSS - meds and diet per alternative means recommended  . cortrak in am   Other Stroke Risk Factors  Former Cigarette smoker, quit 3 yrs ago  Family hx stroke (mother at age 23)  Migraines  PVD s/p toe amputation (R 1st toe) - was on ASA 81 and Xarelto 2.5 daily PTA  Other Active Problems  COPD  Leukocytosis 12.7 (afebrile). CXR ok. UA pending.  Elevated troponin 36  Hypokalemia K 3.2 - supplement     Hospital day # 1  This patient is critically ill due to large right MCA stroke, right ICA and MCA occlusion, HTN, dysphagia and at significant risk of neurological worsening, death  form recurrent stroke, cerebral edema, brain herniation, aspiration pneumonia, respiratory failure. This patient's care requires constant monitoring of vital signs, hemodynamics, respiratory and cardiac monitoring, review of multiple databases, neurological assessment, discussion with family, other specialists and medical decision making of high complexity. I spent 35 minutes of neurocritical care time in the care of this patient.  Marvel Plan, MD PhD Stroke Neurology 04/19/2019 9:56 AM   To contact Stroke Continuity provider, please refer to [email or web address]. After hours, contact General Neurology

## 2019-04-19 NOTE — Progress Notes (Signed)
Bilateral lower extremity venous duplex has been completed. Preliminary results can be found in CV Proc through chart review.   04/19/19 10:46 AM Olen Cordial RVT

## 2019-04-19 NOTE — Progress Notes (Signed)
Modified Barium Swallow Progress Note  Patient Details  Name: Angela Alvarez MRN: [id number] Date of Birth: [birth year]  Today's Date: 04/19/2019  Modified Barium Swallow completed.  Full report located under Chart Review in the Imaging Section.  Brief recommendations include the following:  Clinical Impression  Pt presents with significantly reduced alertness at the time of MBS (1145), which is much different from her presentation at 0900 this morning. Pt required onging verbal and tactile stimulation to maintain alertness and attention to task. Pt is at very high aspiration risk, due to level of alertness as well as sensory and motor based dysphagia.  Orally, pt exhibits holding, poor bolus formation and posterior propulsion, and premature spillage over the tongue base. Left anterior leakage was noted on nectar and honey thick liquids via teaspoon.   Pharyngeal swallow is characterized by trigger of the swallow reflex at the level of the pyriform sinus on nectar thick liquids, and at the vallecular sinus on honey thick liquids and puree textures. This significant delay resulted in frank aspiration of nectar thick liquid during the swallow, with weak ineffective reflexive cough response elicited. No penetration or aspiration was seen on puree or honey thick liquids, however, vallecular and pyriform sinus residue places pt at increased risk of aspiration after the swallow on these textures.   NPO status is recommended at this time, with consideration of nonoral feeding methods for nutrition, hydration, and medication.   During skilled ST intervention, pt will benefit from trials of puree and thickened liquids at bedside. SLP will continue to follow pt for treatment, and to determine readiness to advance to po intake. RN and MD were informed of results and recommendations.    Swallow Evaluation Recommendations  SLP Diet Recommendations: NPO;Alternative means - temporary   Medication  Administration: Via alternative means   Oral Care Recommendations: Oral care QID   Other Recommendations: Have oral suction available   Harlow Asa, MSP, CCC-SLP Speech Language Pathologist Office: [phone] Pager: [phone]  Leigh Aurora 04/19/2019,1:17 PM

## 2019-04-20 ENCOUNTER — Inpatient Hospital Stay (HOSPITAL_COMMUNITY): Payer: 59

## 2019-04-20 DIAGNOSIS — M79671 Pain in right foot: Secondary | ICD-10-CM

## 2019-04-20 LAB — URINALYSIS, ROUTINE W REFLEX MICROSCOPIC
Bacteria, UA: NONE SEEN
Bilirubin Urine: NEGATIVE
Glucose, UA: NEGATIVE mg/dL
Hgb urine dipstick: NEGATIVE
Ketones, ur: 20 mg/dL — AB
Nitrite: NEGATIVE
Protein, ur: NEGATIVE mg/dL
Specific Gravity, Urine: 1.02 (ref 1.005–1.030)
pH: 5 (ref 5.0–8.0)

## 2019-04-20 LAB — GLUCOSE, CAPILLARY
Glucose-Capillary: 104 mg/dL — ABNORMAL HIGH (ref 70–99)
Glucose-Capillary: 76 mg/dL (ref 70–99)

## 2019-04-20 LAB — MAGNESIUM
Magnesium: 2 mg/dL (ref 1.7–2.4)
Magnesium: 1.9 mg/dL (ref 1.7–2.4)

## 2019-04-20 LAB — RAPID URINE DRUG SCREEN, HOSP PERFORMED
Amphetamines: NOT DETECTED
Barbiturates: NOT DETECTED
Benzodiazepines: NOT DETECTED
Cocaine: NOT DETECTED
Opiates: NOT DETECTED
Tetrahydrocannabinol: NOT DETECTED

## 2019-04-20 LAB — BASIC METABOLIC PANEL
Anion gap: 12 (ref 5–15)
BUN: 13 mg/dL (ref 8–23)
CO2: 23 mmol/L (ref 22–32)
Calcium: 8.8 mg/dL — ABNORMAL LOW (ref 8.9–10.3)
Chloride: 121 mmol/L — ABNORMAL HIGH (ref 98–111)
Creatinine, Ser: 0.69 mg/dL (ref 0.44–1.00)
GFR calc Af Amer: >60 mL/min (ref >60)
GFR calc non Af Amer: >60 mL/min (ref >60)
Glucose, Bld: 110 mg/dL — ABNORMAL HIGH (ref 70–99)
Potassium: 3.5 mmol/L (ref 3.5–5.1)
Sodium: 156 mmol/L — ABNORMAL HIGH (ref 135–145)

## 2019-04-20 LAB — CBC
HCT: 44.5 % (ref 36.0–46.0)
Hemoglobin: 13.6 g/dL (ref 12.0–15.0)
MCH: 27.5 pg (ref 26.0–34.0)
MCHC: 30.6 g/dL (ref 30.0–36.0)
MCV: 89.9 fL (ref 80.0–100.0)
Platelets: 368 10*3/uL (ref 150–400)
RBC: 4.95 MIL/uL (ref 3.87–5.11)
RDW: 14.8 % (ref 11.5–15.5)
WBC: 10.7 10*3/uL — ABNORMAL HIGH (ref 4.0–10.5)
nRBC: 0 % (ref 0.0–0.2)

## 2019-04-20 LAB — PHOSPHORUS
Phosphorus: 4.6 mg/dL (ref 2.5–4.6)
Phosphorus: 3.9 mg/dL (ref 2.5–4.6)

## 2019-04-20 LAB — SODIUM
Sodium: 157 mmol/L — ABNORMAL HIGH (ref 135–145)
Sodium: 156 mmol/L — ABNORMAL HIGH (ref 135–145)

## 2019-04-20 MED ORDER — ATORVASTATIN CALCIUM 40 MG PO TABS: 40.0000 mg | ORAL_TABLET | Freq: Every day | ORAL | Status: DC

## 2019-04-20 MED ORDER — ATORVASTATIN CALCIUM 40 MG PO TABS
40.0000 mg | ORAL_TABLET | Freq: Every day | ORAL | Status: DC
  Administered 2019-04-20: 40 mg
  Filled 2019-04-20: qty 1

## 2019-04-20 MED ORDER — EZETIMIBE 10 MG PO TABS
10.0000 mg | ORAL_TABLET | Freq: Every day | ORAL | Status: DC
  Administered 2019-04-21: 10 mg
  Filled 2019-04-20: qty 1

## 2019-04-20 MED ORDER — ASPIRIN 325 MG PO TABS
325.0000 mg | ORAL_TABLET | Freq: Every day | ORAL | Status: DC
  Administered 2019-04-21: 325 mg
  Filled 2019-04-20: qty 1

## 2019-04-20 MED ORDER — SENNOSIDES-DOCUSATE SODIUM 8.6-50 MG PO TABS: 1.0000 | ORAL_TABLET | Freq: Every evening | ORAL | Status: DC | PRN

## 2019-04-20 MED ORDER — CLOPIDOGREL BISULFATE 75 MG PO TABS
75.0000 mg | ORAL_TABLET | Freq: Every day | ORAL | Status: DC
  Administered 2019-04-21: 75 mg
  Filled 2019-04-20: qty 1

## 2019-04-20 MED ORDER — VITAL HIGH PROTEIN PO LIQD: 1000.0000 mL | ORAL | Status: DC

## 2019-04-20 MED ORDER — OSMOLITE 1.2 CAL PO LIQD
474.0000 mL | Freq: Three times a day (TID) | ORAL | Status: DC
  Administered 2019-04-20: 23:00:00 474 mL
  Administered 2019-04-20: 17:00:00 120 mL
  Administered 2019-04-21 (×2): 474 mL
  Filled 2019-04-20 (×6): qty 474

## 2019-04-20 MED ORDER — CLOPIDOGREL BISULFATE 75 MG PO TABS: 75.0000 mg | ORAL_TABLET | Freq: Every day | ORAL | Status: DC

## 2019-04-20 MED ORDER — PRO-STAT SUGAR FREE PO LIQD
30.0000 mL | Freq: Every day | ORAL | Status: DC
  Administered 2019-04-20 – 2019-04-21 (×2): 30 mL
  Filled 2019-04-20 (×2): qty 30

## 2019-04-20 MED ORDER — ASPIRIN 325 MG PO TABS: 325.0000 mg | ORAL_TABLET | Freq: Every day | ORAL | Status: DC

## 2019-04-20 MED ORDER — LABETALOL HCL 5 MG/ML IV SOLN: 5.0000 mg | INTRAVENOUS | Status: DC | PRN

## 2019-04-20 MED ORDER — PRO-STAT SUGAR FREE PO LIQD: 30.0000 mL | Freq: Two times a day (BID) | ORAL | Status: DC

## 2019-04-20 MED ORDER — ASPIRIN EC 325 MG PO TBEC: 325.0000 mg | DELAYED_RELEASE_TABLET | Freq: Every day | ORAL | Status: DC

## 2019-04-20 MED ORDER — OXYCODONE HCL 5 MG PO TABS: 5.0000 mg | ORAL_TABLET | ORAL | Status: DC | PRN

## 2019-04-20 MED ORDER — AMLODIPINE BESYLATE 10 MG PO TABS: 10.0000 mg | ORAL_TABLET | Freq: Every day | ORAL | Status: DC

## 2019-04-20 MED ORDER — MORPHINE SULFATE (PF) 2 MG/ML IV SOLN
2.0000 mg | Freq: Four times a day (QID) | INTRAVENOUS | Status: DC | PRN
  Administered 2019-04-20: 2 mg via INTRAVENOUS
  Filled 2019-04-20: qty 1

## 2019-04-20 MED ORDER — AMLODIPINE BESYLATE 10 MG PO TABS
10.0000 mg | ORAL_TABLET | Freq: Every day | ORAL | Status: DC
  Administered 2019-04-21: 10 mg
  Filled 2019-04-20: qty 1

## 2019-04-20 MED ORDER — EZETIMIBE 10 MG PO TABS: 10.0000 mg | ORAL_TABLET | Freq: Every day | ORAL | Status: DC

## 2019-04-20 MED ORDER — OXYCODONE HCL 5 MG PO TABS
5.0000 mg | ORAL_TABLET | ORAL | Status: DC | PRN
  Administered 2019-04-20 – 2019-04-21 (×2): 5 mg
  Filled 2019-04-20 (×2): qty 1

## 2019-04-20 NOTE — Progress Notes (Signed)
Received from 4N in bed at 1910.  Oriented to surroundings.  Denies pain.  Was agitated and climbing out of the bed.  Yelling out but denying pain.  Said she was just thirsty and kept wanting water.  Reminded her she was npo but did give her some oral care with ice cold water and that made her happy and calmed her down more.

## 2019-04-20 NOTE — Progress Notes (Signed)
Initial Nutrition Assessment  DOCUMENTATION CODES:   Not applicable  INTERVENTION:   Start bolus feedings 1/2 can Osmolite 1.2 then as tolerated increase to 2 cans TID 30 ml Prostat daily  Provides: 1810 kcal, 94 grams protein, and 1170 ml free water.   Will add additional free water once off 3% and Na monitoring.   NUTRITION DIAGNOSIS:   Inadequate oral intake related to inability to eat as evidenced by NPO status.  GOAL:   Patient will meet greater than or equal to 90% of their needs  MONITOR:   Diet advancement, TF tolerance  REASON FOR ASSESSMENT:   Consult Enteral/tube feeding initiation and management  ASSESSMENT:   Pt with PMH of PVD s/p toe amputations and COPD admitted with large R MCA infarct.   Pt discussed during ICU rounds and with RN. Pt on 3%. Pt is unable to provide hx at this time.   1/15 Cortrak placed; tip gastric   Medications reviewed  Labs reviewed: Na 156 (H)    NUTRITION - FOCUSED PHYSICAL EXAM:    Most Recent Value  Orbital Region  No depletion  Upper Arm Region  No depletion  Thoracic and Lumbar Region  No depletion  Buccal Region  Mild depletion  Temple Region  No depletion  Clavicle Bone Region  No depletion  Clavicle and Acromion Bone Region  No depletion  Scapular Bone Region  No depletion  Dorsal Hand  No depletion  Patellar Region  Mild depletion  Anterior Thigh Region  Mild depletion  Posterior Calf Region  Mild depletion  Edema (RD Assessment)  None  Hair  Reviewed  Eyes  Reviewed  Mouth  Reviewed  Skin  Reviewed  Nails  Reviewed       Diet Order:   Diet Order            Diet NPO time specified  Diet effective now              EDUCATION NEEDS:   No education needs have been identified at this time  Skin:  Skin Assessment: Reviewed RN Assessment  Last BM:  1/15  Height:   Ht Readings from Last 1 Encounters:  04/18/19 5\' 6"  (1.676 m)    Weight:   Wt Readings from Last 1 Encounters:   04/18/19 70.1 kg    Ideal Body Weight:  59 kg  BMI:  Body mass index is 24.94 kg/m.  Estimated Nutritional Needs:   Kcal:  1750-2100  Protein:  85-100 grams  Fluid:  > 1.7 L/day  04/20/19 RD, LDN, CNSC [phone] Pager [phone] After Hours Pager

## 2019-04-20 NOTE — Progress Notes (Signed)
Occupational Therapy Treatment Patient Details Name: Angela Alvarez MRN: [id number] DOB: 06-25-1955 Today's Date: 04/20/2019    History of present illness Pt is a 64 yo female s/p L side weakness with severe dysarthria, L hemineglect and anosognosia.   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   OT comments  This 64 yo female seen in conjunction with PT today to work on sitting balance, standing and transfers. Pt did better with following commands today, she continues with severe left neglect (but can move head and eyes past midline to left with increased time and cues), able to stand for a short amount to time (limited by pain in right foot--xray negative today). Pt will continue to benefit from acute OT with post acute follow up.  Follow Up Recommendations  CIR;Supervision/Assistance - 24 hour    Equipment Recommendations  Other (comment)(TBD next venue)       Precautions / Restrictions Precautions Precautions: Fall Precaution Comments: L hemiplegia, left neglect Restrictions Weight Bearing Restrictions: No       Mobility Bed Mobility Overal bed mobility: Needs Assistance Bed Mobility: Rolling;Sidelying to Sit Rolling: Max assist Sidelying to sit: Max assist;+2 for physical assistance;+2 for safety/equipment       General bed mobility comments: truncal assist and direction.  hand over hand direction of R UE to assist.  pt needed maximal assist to coordinate scoot to the EOB  Transfers Overall transfer level: Needs assistance Equipment used: (back of recliner in front of her to hold onto handle to stand) Transfers: Sit to/from [employer] Sit to Stand: Max assist;+2 physical assistance   Squat pivot transfers: Max assist;+2 physical assistance     General transfer comment: cues for hand placement and prep for standing.  assist to come forward and boost.  stability for w/shift and L knee  stability    Balance Overall balance assessment: Needs assistance Sitting-balance support: Single extremity supported;Feet supported Sitting balance-Leahy Scale: Poor Sitting balance - Comments: Pt has tendency to lean to left and posteriorly in sitting--needs VCs to try and correct but does not maintain correction.   Standing balance support: Single extremity supported Standing balance-Leahy Scale: Poor Standing balance comment: full balance support on L LE, with RUE holding onto handle on back of recliner that was in front of her (pt c/o of right foot being too painful to maintain further standing)                           ADL either performed or assessed with clinical judgement   ADL Overall ADL's : Needs assistance/impaired     Grooming: Wash/dry face;Sitting Grooming Details (indicate cue type and reason): EOB supported, pt did wash her entire face, min-max A for sitting balance                 Toilet Transfer: Maximal assistance;+2 for physical assistance;Stand-pivot Toilet Transfer Details (indicate cue type and reason): bed to recliner going to  pt's right                 Vision Baseline Vision/History: Wears glasses Wears Glasses: Reading only Additional Comments: Pt continues with right gaze preference, with increased time and constant cues she can turn her head and eyes past midline to right (but alot of cues and effort)          Cognition Arousal/Alertness: Awake/alert Behavior During Therapy: Flat  affect;WFL for tasks assessed/performed Overall Cognitive Status: Impaired/Different from baseline Area of Impairment: Following commands;Safety/judgement;Problem solving                       Following Commands: Follows one step commands inconsistently Safety/Judgement: Decreased awareness of safety;Decreased awareness of deficits   Problem Solving: Slow processing;Decreased initiation;Difficulty sequencing;Requires verbal  cues;Requires tactile cues General Comments: Pt attempting to follow commands today without requiring redirecton. One time today the PT sitting on her left side to provide balance support said to her to wash my side (in relation to patient was washing her face and he wanted her to wash the left side of her face)--she did and then reached for him as to wash his face (which was an appropriate interpretation of the way he said it--we all got a good laugh)                   Pertinent Vitals/ Pain       Pain Assessment: Faces Faces Pain Scale: Hurts even more Pain Location: R foot Pain Descriptors / Indicators: Discomfort Pain Intervention(s): Monitored during session;Repositioned;Other (comment)(made MD aware)         Frequency  Min 2X/week        Progress Toward Goals  OT Goals(current goals can now be found in the care plan section)  Progress towards OT goals: Progressing toward goals  Acute Rehab OT Goals Patient Stated Goal: to get something so my mouth is not so dry  Plan Discharge plan remains appropriate    Co-evaluation    PT/OT/SLP Co-Evaluation/Treatment: Yes(partial) Reason for Co-Treatment: Complexity of the patient's impairments (multi-system involvement);To address functional/ADL transfers PT goals addressed during session: Mobility/safety with mobility;Balance;Strengthening/ROM OT goals addressed during session: Strengthening/ROM;ADL's and self-care      AM-PAC OT "6 Clicks" Daily Activity     Outcome Measure   Help from another person eating meals?: Total(NPO) Help from another person taking care of personal grooming?: A Lot Help from another person toileting, which includes using toliet, bedpan, or urinal?: Total Help from another person bathing (including washing, rinsing, drying)?: Total Help from another person to put on and taking off regular upper body clothing?: Total Help from another person to put on and taking off regular lower body  clothing?: Total 6 Click Score: 7    End of Session    OT Visit Diagnosis: Unsteadiness on feet (R26.81);Muscle weakness (generalized) (M62.81);Pain;Low vision, both eyes (H54.2);Other symptoms and signs involving cognitive function;Hemiplegia and hemiparesis Hemiplegia - Right/Left: Left Hemiplegia - dominant/non-dominant: Non-Dominant Hemiplegia - caused by: Cerebral infarction Pain - Right/Left: Right Pain - part of body: (foot)   Activity Tolerance Patient tolerated treatment well   Patient Left in chair;with call bell/phone within reach;with chair alarm set   Nurse Communication Mobility status;Need for lift equipment        Time: 4332-9518 OT Time Calculation (min): 36 min  Charges: OT General Charges $OT Visit: 1 Visit OT Treatments $Self Care/Home Management : 8-22 mins  [facility] OTR/L Acute [employer] Pager [phone] Office [phone]      04/20/2019, 4:37 PM

## 2019-04-20 NOTE — Progress Notes (Signed)
Physical Therapy Treatment Patient Details Name: Angela Alvarez MRN: [id number] DOB: May 16, 1955 Today's Date: 04/20/2019    History of Present Illness Pt is a 64 yo female s/p L side weakness with severe dysarthria, L hemineglect and anosognosia.   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    PT Comments    Pt a little more alert, though not significantly, she could focus more on task.  Pt could make small adjustment to sitting balance that she could not accomplish in previous sessions.  Emphasis on transitions, sitting balance, standing and transfers.    Follow Up Recommendations  CIR;Supervision/Assistance - 24 hour     Equipment Recommendations  Other (comment)(TBA)    Recommendations for Other Services Rehab consult     Precautions / Restrictions Precautions Precautions: Fall Precaution Comments: L hemiplegia, left neglect Restrictions Weight Bearing Restrictions: No    Mobility  Bed Mobility Overal bed mobility: Needs Assistance Bed Mobility: Rolling;Sidelying to Sit Rolling: Max assist Sidelying to sit: Max assist;+2 for physical assistance;+2 for safety/equipment       General bed mobility comments: truncal assist and direction.  hand over hand direction of R UE to assist.  pt needed maximal assist to coordinate scoot to the EOB  Transfers Overall transfer level: Needs assistance Equipment used: (back of recliner in front of her to hold onto handle to stand) Transfers: Sit to/from [employer] Sit to Stand: Max assist;+2 physical assistance   Squat pivot transfers: Max assist;+2 physical assistance     General transfer comment: cues for hand placement and prep for standing.  assist to come forward and boost.  stability for w/shift and L knee stability  Ambulation/Gait             General Gait Details: unable   Stairs             Wheelchair Mobility     Modified Rankin (Stroke Patients Only) Modified Rankin (Stroke Patients Only) Pre-Morbid Rankin Score: No symptoms Modified Rankin: Severe disability     Balance Overall balance assessment: Needs assistance Sitting-balance support: Single extremity supported;Feet supported Sitting balance-Leahy Scale: Poor Sitting balance - Comments: Pt has tendency to lean to left and posteriorly in sitting--needs VCs to try and correct but does not maintain correction.   Standing balance support: Single extremity supported Standing balance-Leahy Scale: Poor Standing balance comment: full balance support on L LE, with RUE holding onto handle on back of recliner that was in front of her (pt c/o of right foot being too painful to maintain further standing)                            Cognition Arousal/Alertness: Awake/alert Behavior During Therapy: Flat affect;WFL for tasks assessed/performed Overall Cognitive Status: Impaired/Different from baseline Area of Impairment: Following commands;Safety/judgement;Problem solving                       Following Commands: Follows one step commands inconsistently Safety/Judgement: Decreased awareness of safety;Decreased awareness of deficits   Problem Solving: Slow processing;Decreased initiation;Difficulty sequencing;Requires verbal cues;Requires tactile cues General Comments: Pt attempting to follow commands today without requiring redirecton. One time today the PT sitting on her left side to provide balance support said to her to wash my side (in relation to patient was washing her face and he wanted her to wash the left side of  her face)--she did and then reached for him as to wash his face (which was an appropriate interpretation of the way he said it--we all got a good laugh)      Exercises Other Exercises Other Exercises: warm up bil LE ROM prior to mobility.  Graded resistance on the R LE and facilitation to get activation on the L  LE.    General Comments        Pertinent Vitals/Pain Pain Assessment: Faces Faces Pain Scale: Hurts even more Pain Location: R foot Pain Descriptors / Indicators: Discomfort Pain Intervention(s): Monitored during session;Repositioned;Other (comment)(made MD aware)    Home Living                      Prior Function            PT Goals (current goals can now be found in the care plan section) Acute Rehab PT Goals Patient Stated Goal: to get something so my mouth is not so dry PT Goal Formulation: With patient Time For Goal Achievement: 05/02/19 Potential to Achieve Goals: Fair Progress towards PT goals: Progressing toward goals(starting to show progression day to day)    Frequency    Min 3X/week      PT Plan Current plan remains appropriate    Co-evaluation PT/OT/SLP Co-Evaluation/Treatment: Yes Reason for Co-Treatment: Complexity of the patient's impairments (multi-system involvement);To address functional/ADL transfers PT goals addressed during session: Mobility/safety with mobility;Balance;Strengthening/ROM OT goals addressed during session: Strengthening/ROM;ADL's and self-care      AM-PAC PT "6 Clicks" Mobility   Outcome Measure  Help needed turning from your back to your side while in a flat bed without using bedrails?: Total Help needed moving from lying on your back to sitting on the side of a flat bed without using bedrails?: Total Help needed moving to and from a bed to a chair (including a wheelchair)?: Total Help needed standing up from a chair using your arms (e.g., wheelchair or bedside chair)?: Total Help needed to walk in hospital room?: Total Help needed climbing 3-5 steps with a railing? : Total 6 Click Score: 6    End of Session   Activity Tolerance: Patient tolerated treatment well;Other (comment)(mild fatigue) Patient left: in chair;with call bell/phone within reach;with chair alarm set Nurse Communication: Mobility status PT  Visit Diagnosis: Other abnormalities of gait and mobility (R26.89);Difficulty in walking, not elsewhere classified (R26.2);Hemiplegia and hemiparesis Hemiplegia - Right/Left: Left Hemiplegia - dominant/non-dominant: Non-dominant Hemiplegia - caused by: Cerebral infarction     Time: 9485-4627 PT Time Calculation (min) (ACUTE ONLY): 36 min  Charges:  $Therapeutic Activity: 8-22 mins                     04/20/2019  Jacinto Halim., PT Acute Rehabilitation Services [phone]  (pager) [phone]  (office)   Angela Alvarez 04/20/2019, 6:14 PM

## 2019-04-20 NOTE — Progress Notes (Signed)
Inpatient Rehab Admissions Coordinator:   Met with pt's nephew.  Unfortunately family is unable to provide 24\7 assist at discharge.  Will need to pursue SNF for longer term rehab.  Will sign off.   Shann Medal, PT, DPT Admissions Coordinator [phone] 04/20/19  2:12 PM

## 2019-04-20 NOTE — Progress Notes (Signed)
STROKE TEAM PROGRESS NOTE   INTERVAL HISTORY Pt sitting in bedside chair. Mildly agitated and restless, putting leg outside the chair, keep messing up with sheet. Continue to complain right foot pain. CT repeat yesterday showed stable cerebral edema. Na went up to 160, off 3%, now on NS, Na gradually trending down.   Vitals:   04/20/19 0806 04/20/19 0900 04/20/19 0923 04/20/19 1005  BP: (!) 141/86  (!) 165/93 (!) 181/89  Pulse: 87 68 91 94  Resp: (!) 21 15 (!) 24 (!) 22  Temp:      TempSrc:      SpO2: 98% 95% 100% 99%  Weight:      Height:        CBC:  Recent Labs  Lab 04/17/19 2204 04/17/19 2204 04/19/19 0615 04/20/19 0655  WBC 12.7*   < > 9.6 10.7*  NEUTROABS 10.1*  --   --   --   HGB 13.5   < > 12.9 13.6  HCT 43.7   < > 42.1 44.5  MCV 87.4   < > 88.4 89.9  PLT 422*   < > 380 368   < > = values in this interval not displayed.    Basic Metabolic Panel:  Recent Labs  Lab 04/19/19 0615 04/19/19 1220 04/20/19 0034 04/20/19 0655  NA 160*   < > 157* 156*  K 3.2*  --   --  3.5  CL 123*  --   --  121*  CO2 24  --   --  23  GLUCOSE 106*  --   --  110*  BUN 13  --   --  13  CREATININE 0.79  --   --  0.69  CALCIUM 8.7*  --   --  8.8*   < > = values in this interval not displayed.   Lipid Panel:     Component Value Date/Time   CHOL 140 04/18/2019 0245   TRIG 88 04/18/2019 0245   HDL 45 04/18/2019 0245   CHOLHDL 3.1 04/18/2019 0245   VLDL 18 04/18/2019 0245   LDLCALC 77 04/18/2019 0245   HgbA1c:  Lab Results  Component Value Date   HGBA1C 5.9 (H) 04/18/2019   Urine Drug Screen:     Component Value Date/Time   LABOPIA NONE DETECTED 04/20/2019 0223   COCAINSCRNUR NONE DETECTED 04/20/2019 0223   LABBENZ NONE DETECTED 04/20/2019 0223   AMPHETMU NONE DETECTED 04/20/2019 0223   THCU NONE DETECTED 04/20/2019 0223   LABBARB NONE DETECTED 04/20/2019 0223    Alcohol Level     Component Value Date/Time   ETH <10 04/17/2019 2204    IMAGING past 48 hours CT  HEAD WO CONTRAST  Result Date: 04/19/2019 CLINICAL DATA:  Stroke follow-up EXAM: CT HEAD WITHOUT CONTRAST TECHNIQUE: Contiguous axial images were obtained from the base of the skull through the vertex without intravenous contrast. COMPARISON:  Two days ago FINDINGS: Brain: Cytotoxic edema in the right MCA territory with unchanged extent. Local mass effect with midline shift measuring 5 mm. Subcentimeter globular high-density along the lower right sylvian fissure with no enhancing aneurysm seen on prior CTA, presumably a small focus of petechial hemorrhage. No new site of infarct. Vascular: As above Skull: Negative Sinuses/Orbits: Negative IMPRESSION: Right MCA infarct is unchanged in extent with up to 5 mm of midline shift. Mild petechial hemorrhage is stable. Electronically Signed   By: Monte Fantasia M.D.   On: 04/19/2019 05:10   DG Swallowing Func-Speech Pathology  Result Date:  04/19/2019 Objective Swallowing Evaluation: Type of Study: MBS-Modified Barium Swallow Study  Patient Details Name: Angela Alvarez MRN: [id number] Date of Birth: 10/19/1955 Today's Date: 04/19/2019 Time: SLP Start Time (ACUTE ONLY): 1145 -SLP Stop Time (ACUTE ONLY): 1215 SLP Time Calculation (min) (ACUTE ONLY): 30 min Past Medical History: Past Medical History: Diagnosis Date . Abnormal Pap smear of cervix 06-01-13  ascus pap:pos.HR HPV/hx of cryotherapy to cx 1985 . Arthritis   knee . Asthma  . COPD (chronic obstructive pulmonary disease) (HCC)  . Critical lower limb ischemia   ischemic appearing left first toe . Headache   MMigraines . Peripheral vascular disease [facility])  Past Surgical History: Past Surgical History: Procedure Laterality Date . ABDOMINAL AORTOGRAM W/LOWER EXTREMITY Bilateral 09/11/2018  Procedure: ABDOMINAL AORTOGRAM W/LOWER EXTREMITY;  Surgeon: Maeola Harman, MD;  Location: [facility] INVASIVE CV LAB;  Service: Cardiovascular;  Laterality: Bilateral; . AMPUTATION Right 05/19/2015  Procedure: RIGHT FIRST RAY  AMPUTATION;  Surgeon: Sherren Kerns, MD;  Location: [facility] OR;  Service: Vascular;  Laterality: Right; . AMPUTATION Right 02/28/2019  Procedure: AMPUTATION DIGIT - RIGHT FOURTH TOE;  Surgeon: Maeola Harman, MD;  Location: [facility] OR;  Service: Vascular;  Laterality: Right; . BREAST SURGERY Right   cyst . BUNIONECTOMY Bilateral   bilateral . CARDIAC CATHETERIZATION Bilateral   Catarct . COLONOSCOPY W/ POLYPECTOMY   . KNEE ARTHROPLASTY Right 07/21/2017 . PERIPHERAL VASCULAR CATHETERIZATION N/A 04/28/2015  Procedure: Lower Extremity Angiography;  Surgeon: Runell Gess, MD;  Location: [facility] INVASIVE CV LAB;  Service: Cardiovascular;  Laterality: N/A; . PERIPHERAL VASCULAR INTERVENTION Left 09/11/2018  Procedure: PERIPHERAL VASCULAR INTERVENTION;  Surgeon: Maeola Harman, MD;  Location: [facility] INVASIVE CV LAB;  Service: Cardiovascular;  Laterality: Left;  SFA stent . surgery under arm Right   --benign breast tissue HPI: Pt is a 64 y.o. female found down at home, presenting with L sided weakness and dysarthria. CT showed large R MCA territory infarct with 42mm midline shift; MRI pending. PMH: PVD s/p toe amputations, headaches, COPD  Subjective: Pt seen in radiology for MBS. She required ongoing verbal and tactile stim to maintain alertness. Assessment / Plan / Recommendation CHL IP CLINICAL IMPRESSIONS 04/19/2019 Clinical Impression Pt presents with significantly reduced alertness at the time of MBS (1145), which is much different from her presentation at 0900 this morning. Pt required onging verbal and tactile stimulation to maintain alertness and attention to task. Pt is at very high aspiration risk, due to level of alertness as well as sensory and motor based dysphagia. Orally, pt exhibits holding, poor bolus formation and posterior propulsion, and premature spillage over the tongue base. Left anterior leakage was noted on nectar and honey thick liquids via teaspoon. Pharyngeal swallow is characterized by  trigger of the swallow reflex at the level of the pyriform sinus on nectar thick liquids, and at the vallecular sinus on honey thick liquids and puree textures. This significant delay resulted in frank aspiration of nectar thick liquid during the swallow, with weak ineffective reflexive cough response elicited. No penetration or aspiration was seen on puree or honey thick liquids, however, vallecular and pyriform sinus residue places pt at increased risk of aspiration after the swallow on these textures. NPO status is recommended at this time, with consideration of nonoral feeding methods for nutrition, hydration, and medication. During skilled ST intervention, pt will benefit from trials of puree and thickened liquids at bedside. SLP will continue to follow pt for treatment, and to determine readiness to advance to po intake. RN  and MD were informed of results and recommendations.  SLP Visit Diagnosis Dysphagia, oropharyngeal phase (R13.12)     Impact on safety and function Severe aspiration risk;Risk for inadequate nutrition/hydration   CHL IP TREATMENT RECOMMENDATION 04/19/2019 Treatment Recommendations Therapy as outlined in treatment plan below   Prognosis 04/19/2019 Prognosis for Safe Diet Advancement Good Barriers to Reach Goals Cognitive deficits CHL IP DIET RECOMMENDATION 04/19/2019 SLP Diet Recommendations NPO;Alternative means - temporary   Medication Administration Via alternative means   CHL IP OTHER RECOMMENDATIONS 04/19/2019   Oral Care Recommendations Oral care QID Other Recommendations Have oral suction available   CHL IP FOLLOW UP RECOMMENDATIONS 04/19/2019 Follow up Recommendations Inpatient Rehab   CHL IP FREQUENCY AND DURATION 04/19/2019 Speech Therapy Frequency (ACUTE ONLY) min 2x/week Treatment Duration 2 weeks      CHL IP ORAL PHASE 04/19/2019 Oral Phase Impaired     Oral - Honey Teaspoon Left anterior bolus loss;Weak lingual manipulation;Reduced posterior propulsion;Holding of bolus;Delayed oral  transit;Decreased bolus cohesion;Premature spillage   Oral - Nectar Teaspoon Left anterior bolus loss;Weak lingual manipulation;Reduced posterior propulsion;Holding of bolus;Delayed oral transit;Decreased bolus cohesion;Premature spillage   Oral - Puree Weak lingual manipulation;Holding of bolus;Delayed oral transit;Decreased bolus cohesion;Premature spillage    CHL IP PHARYNGEAL PHASE 04/19/2019 Pharyngeal Phase Impaired   Pharyngeal- Honey Teaspoon Delayed swallow initiation-vallecula;Reduced pharyngeal peristalsis;Reduced epiglottic inversion;Reduced anterior laryngeal mobility;Reduced laryngeal elevation;Pharyngeal residue - valleculae;Pharyngeal residue - pyriform   Pharyngeal- Nectar Teaspoon Delayed swallow initiation-pyriform sinuses;Reduced pharyngeal peristalsis;Reduced epiglottic inversion;Reduced anterior laryngeal mobility;Reduced laryngeal elevation;Reduced airway/laryngeal closure;Penetration/Aspiration during swallow;Moderate aspiration;Pharyngeal residue - valleculae;Pharyngeal residue - pyriform Pharyngeal Material enters airway, passes BELOW cords and not ejected out despite cough attempt by patient   Pharyngeal- Puree Delayed swallow initiation-vallecula;Reduced pharyngeal peristalsis;Reduced anterior laryngeal mobility;Reduced laryngeal elevation;Pharyngeal residue - valleculae;Pharyngeal residue - pyriform;Pharyngeal residue - posterior pharnyx    CHL IP CERVICAL ESOPHAGEAL PHASE 04/19/2019 Cervical Esophageal Phase Leonarda Salon Harlow Asa, MSP, CCC-SLP Speech Language Pathologist Office: [phone] Pager: [phone] Leigh Aurora 04/19/2019, 1:05 PM              VAS [place] LOWER EXTREMITY VENOUS (DVT)  Result Date: 04/19/2019  Lower Venous Study Indications: Stroke.  Risk Factors: None identified. Limitations: Poor ultrasound/tissue interface and patient movement, patient positioning, patient cooperation. Comparison Study: No prior studies. Performing Technologist: Chanda Busing RVT  Examination  Guidelines: A complete evaluation includes B-mode imaging, spectral Doppler, color Doppler, and power Doppler as needed of all accessible portions of each vessel. Bilateral testing is considered an integral part of a complete examination. Limited examinations for reoccurring indications may be performed as noted.  +---------+---------------+---------+-----------+----------+--------------+ RIGHT    CompressibilityPhasicitySpontaneityPropertiesThrombus Aging +---------+---------------+---------+-----------+----------+--------------+ CFV      Full           Yes      Yes                                 +---------+---------------+---------+-----------+----------+--------------+ SFJ      Full                                                        +---------+---------------+---------+-----------+----------+--------------+ FV Prox  Full                                                        +---------+---------------+---------+-----------+----------+--------------+  FV Mid   Full                                                        +---------+---------------+---------+-----------+----------+--------------+ FV DistalFull                                                        +---------+---------------+---------+-----------+----------+--------------+ PFV      Full                                                        +---------+---------------+---------+-----------+----------+--------------+ POP      Full           Yes      Yes                                 +---------+---------------+---------+-----------+----------+--------------+ PTV      Full                                                        +---------+---------------+---------+-----------+----------+--------------+ PERO     Full                                                        +---------+---------------+---------+-----------+----------+--------------+    +---------+---------------+---------+-----------+----------+--------------+ LEFT     CompressibilityPhasicitySpontaneityPropertiesThrombus Aging +---------+---------------+---------+-----------+----------+--------------+ CFV      Full           Yes      Yes                                 +---------+---------------+---------+-----------+----------+--------------+ SFJ      Full                                                        +---------+---------------+---------+-----------+----------+--------------+ FV Prox  Full                                                        +---------+---------------+---------+-----------+----------+--------------+ FV Mid   Full                                                        +---------+---------------+---------+-----------+----------+--------------+   FV DistalFull                                                        +---------+---------------+---------+-----------+----------+--------------+ PFV      Full                                                        +---------+---------------+---------+-----------+----------+--------------+ POP      Full           Yes      Yes                                 +---------+---------------+---------+-----------+----------+--------------+ PTV      Full                                                        +---------+---------------+---------+-----------+----------+--------------+ PERO     Full                                                        +---------+---------------+---------+-----------+----------+--------------+     Summary: Right: There is no evidence of deep vein thrombosis in the lower extremity. No cystic structure found in the popliteal fossa. Left: There is no evidence of deep vein thrombosis in the lower extremity. No cystic structure found in the popliteal fossa.  *See table(s) above for measurements and observations. Electronically signed by Lemar Livings MD on 04/19/2019 at 3:31:36 PM.    Final     PHYSICAL EXAM  Temp:  [97.6 F (36.4 C)-98.7 F (37.1 C)] 97.6 F (36.4 C) (01/15 0800) Pulse Rate:  [68-103] 94 (01/15 1005) Resp:  [13-24] 22 (01/15 1005) BP: (106-205)/(48-130) 181/89 (01/15 1005) SpO2:  [94 %-100 %] 99 % (01/15 1005)  General - Well nourished, well developed, mildly agitated and restless.  Ophthalmologic - fundi not visualized due to noncooperation.  Cardiovascular - Regular rhythm and rate.  Neuro - awake alert, orientated to self, age, time and place. Fluent language but moderate to sever dysarthia, follows commands on the right, hemineglect to the left, anosognosia. Eyes right gaze deviation, barely cross midline. Not blinking to visual threat on the left, left facial droop, left eye closing less strong than the right. Tongue midline. LUE no spontaneous movement, on pain extension posturing. LLE no spontaneous movement, slight withdraw to pain. RUE and RLE 5/5, following commands. Left hemisensory neglect, able to react to pain stimulation. Left babinski positive. Sensation, coordination and gait not tested.   ASSESSMENT/PLAN Angela Alvarez is a 64 y.o. female with history of peripheral vascular disease status post toe amputations, headaches, COPD found down at home, presenting with L sided weakness and speech difficulties.   Stroke:   Large R MCA territory infarct, embolic pattern, could be secondary to large vessel  disease source (athero vs. dissection). However, cardioembolic source can not completely ruled out   CT head R MCA occlusion and portions of R supraclinoid ICA w/ large R MCA territory infarct. Mass effect w/ 5mm midline shift.   CTA head & neck R ICA occlusion at bifurcation to terminus. Right MCA occlusion with scant collateral flow. Mild L ICA bifurcation atherosclerosis. Severe R VA origin 80% stenosis. L V4 at 50% stenosis. Large evolving R MCA territory infarct w/ mass effect and midline  shift. Emphysema.   MRI not able to perform this time due to agitation  Repeat CT 1/14 R MCA infarct w/ stable 5mm midline shift, mild petechial hemorrhage  2D Echo EF 50-55%. No source of embolus   LE venous doppler No DVT   Consider 30 day cardiac event monitoring as outpt if work up neg  LDL 77  HgbA1c 5.9  UDS neg   Lovenox 40 mg sq daily for VTE prophylaxis   aspirin 81 mg daily and Xarelto (rivaroxaban) daily prior to admission, now ASA 325 and plavix 75 DAPT for 3 months and then plavix alone.  Therapy recommendations:  CIR  Disposition:  pending   Cerebral Edema Induced Hypernatremia  Started on 3% protocol  Off 3% now on NS  Na [id number]  Gradually allow Na trending down  Repeat CT 1/14 stable MLS  HTN  Home meds:  none  BP slightly elevated 140-180s  Put on amlodipine 10 . Gradually normalize in 3-5 days . Long-term BP goal 130-150 given right ICA and MCA occlusion  Hyperlipidemia  Home meds:  lipitor 40 and zetia 10  LDL 77, goal < 70  Resumed lipitor and zetia   Continue statin and zetia at discharge  Dysphagia . Secondary to stroke . NPO . Speech on board . Failed MBSS - meds and diet per alternative means recommended  . cortrak placed and will start TF   Right foot pain with multiple toe amputation  S/p first and 4th toe amputation in the past with Dr. Terrill Mohrain  Dark coloration at amputation sites  Tenderness to touch, especially near ankle   Not sure if pt fell before down on the floor  X-ray of right foot pending  ABI testing right LE pending  May consider right foot MRI to rule out osteomyelitis if clinically indicated  Other Stroke Risk Factors  Former Cigarette smoker, quit 3 yrs ago  Family hx stroke (mother at age 65)  Migraines  PVD s/p toe amputation (R 1st toe) - was on ASA 81 and Xarelto 2.5 daily PTA  Other Active Problems  COPD  Leukocytosis 12.7->10.7 (afebrile). CXR ok.  UA neg  Elevated troponin 36  Hypokalemia K 3.2 - supplement - 3.5  Hospital day # 2  This patient is critically ill due to large right MCA stroke, right ICA and MCA occlusion, HTN, dysphagia, PAD and at significant risk of neurological worsening, death form recurrent stroke, cerebral edema, brain herniation, aspiration pneumonia, respiratory failure. This patient's care requires constant monitoring of vital signs, hemodynamics, respiratory and cardiac monitoring, review of multiple databases, neurological assessment, discussion with family, other specialists and medical decision making of high complexity. I spent 35 minutes of neurocritical care time in the care of this patient.  Marvel PlanJindong Mairin Lindsley, MD PhD Stroke Neurology 04/20/2019 11:29 AM   To contact Stroke Continuity provider, please refer to [email or web address]. After hours, contact General Neurology

## 2019-04-20 NOTE — Procedures (Signed)
Cortrak  Person Inserting Tube:  Delmo Matty, RD Tube Type:  Cortrak - 43 inches Tube Location:  Left nare Initial Placement:  Stomach Secured by: Bridle Technique Used to Measure Tube Placement:  Documented cm marking at nare/ corner of mouth Cortrak Secured At:  68 cm Procedure Comments:  Cortrak Tube Team Note:  Consult received to place a Cortrak feeding tube.   No x-ray is required. RN may begin using tube.   If the tube becomes dislodged please keep the tube and contact the Cortrak team at [email or web address] ([email or web address]) for replacement.  If after hours and replacement cannot be delayed, place a NG tube and confirm placement with an abdominal x-ray.    Leyton Brownlee MS, RDN, LDN, CNSC [phone] Pager  [phone] Weekend/On-Call Pager     

## 2019-04-21 LAB — GLUCOSE, CAPILLARY
Glucose-Capillary: 136 mg/dL — ABNORMAL HIGH (ref 70–99)
Glucose-Capillary: 258 mg/dL — ABNORMAL HIGH (ref 70–99)
Glucose-Capillary: 260 mg/dL — ABNORMAL HIGH (ref 70–99)
Glucose-Capillary: 75 mg/dL (ref 70–99)
Glucose-Capillary: 94 mg/dL (ref 70–99)
Glucose-Capillary: 99 mg/dL (ref 70–99)

## 2019-04-21 LAB — PHOSPHORUS: Phosphorus: 4.5 mg/dL (ref 2.5–4.6)

## 2019-04-21 LAB — CBC
HCT: 43.2 % (ref 36.0–46.0)
Hemoglobin: 13.4 g/dL (ref 12.0–15.0)
MCH: 27.9 pg (ref 26.0–34.0)
MCHC: 31 g/dL (ref 30.0–36.0)
MCV: 89.8 fL (ref 80.0–100.0)
Platelets: UNDETERMINED 10*3/uL (ref 150–400)
RBC: 4.81 MIL/uL (ref 3.87–5.11)
RDW: 14.8 % (ref 11.5–15.5)
WBC: 10.4 10*3/uL (ref 4.0–10.5)
nRBC: 0 % (ref 0.0–0.2)

## 2019-04-21 LAB — BASIC METABOLIC PANEL
Anion gap: 11 (ref 5–15)
BUN: 16 mg/dL (ref 8–23)
CO2: 23 mmol/L (ref 22–32)
Calcium: 8.5 mg/dL — ABNORMAL LOW (ref 8.9–10.3)
Chloride: 122 mmol/L — ABNORMAL HIGH (ref 98–111)
Creatinine, Ser: 0.61 mg/dL (ref 0.44–1.00)
GFR calc Af Amer: >60 mL/min (ref >60)
GFR calc non Af Amer: >60 mL/min (ref >60)
Glucose, Bld: 115 mg/dL — ABNORMAL HIGH (ref 70–99)
Potassium: 4 mmol/L (ref 3.5–5.1)
Sodium: 156 mmol/L — ABNORMAL HIGH (ref 135–145)

## 2019-04-21 LAB — TROPONIN I (HIGH SENSITIVITY)
Troponin I (High Sensitivity): 34 ng/L — ABNORMAL HIGH (ref <18)
Troponin I (High Sensitivity): 41 ng/L — ABNORMAL HIGH (ref <18)

## 2019-04-21 LAB — MAGNESIUM: Magnesium: 2.1 mg/dL (ref 1.7–2.4)

## 2019-04-21 MED ORDER — HALOPERIDOL LACTATE 2 MG/ML PO CONC: 0.5000 mg | ORAL | Status: DC | PRN

## 2019-04-21 MED ORDER — LORAZEPAM 1 MG PO TABS: 1.0000 mg | ORAL_TABLET | ORAL | Status: DC | PRN

## 2019-04-21 MED ORDER — LORAZEPAM 2 MG/ML IJ SOLN: 1.0000 mg | INTRAMUSCULAR | Status: DC | PRN

## 2019-04-21 MED ORDER — ONDANSETRON 4 MG PO TBDP: 4.0000 mg | ORAL_TABLET | Freq: Four times a day (QID) | ORAL | Status: DC | PRN

## 2019-04-21 MED ORDER — BIOTENE DRY MOUTH MT LIQD: 15.0000 mL | OROMUCOSAL | Status: DC | PRN

## 2019-04-21 MED ORDER — HALOPERIDOL LACTATE 5 MG/ML IJ SOLN: 0.5000 mg | INTRAMUSCULAR | Status: DC | PRN

## 2019-04-21 MED ORDER — ONDANSETRON HCL 4 MG/2ML IJ SOLN: 4.0000 mg | Freq: Four times a day (QID) | INTRAMUSCULAR | Status: DC | PRN

## 2019-04-21 MED ORDER — HALOPERIDOL 0.5 MG PO TABS: 0.5000 mg | ORAL_TABLET | ORAL | Status: DC | PRN

## 2019-04-21 MED ORDER — LORAZEPAM 2 MG/ML PO CONC: 1.0000 mg | ORAL | Status: DC | PRN

## 2019-04-21 MED ORDER — GLYCOPYRROLATE 1 MG PO TABS: 1.0000 mg | ORAL_TABLET | ORAL | Status: DC | PRN

## 2019-04-21 MED ORDER — POLYVINYL ALCOHOL 1.4 % OP SOLN
1.0000 [drp] | Freq: Four times a day (QID) | OPHTHALMIC | Status: DC | PRN
  Filled 2019-04-21: qty 15

## 2019-04-21 MED ORDER — BISACODYL 10 MG RE SUPP: 10.0000 mg | Freq: Every day | RECTAL | Status: DC | PRN

## 2019-04-21 MED ORDER — MORPHINE SULFATE (PF) 2 MG/ML IV SOLN
1.0000 mg | INTRAVENOUS | Status: DC | PRN
  Administered 2019-04-21 – 2019-04-22 (×3): 1 mg via INTRAVENOUS
  Filled 2019-04-21 (×3): qty 1

## 2019-04-21 MED ORDER — GLYCOPYRROLATE 0.2 MG/ML IJ SOLN: 0.2000 mg | INTRAMUSCULAR | Status: DC | PRN

## 2019-04-21 MED ORDER — MORPHINE SULFATE (CONCENTRATE) 10 MG/0.5ML PO SOLN: 5.0000 mg | ORAL | Status: DC | PRN

## 2019-04-21 NOTE — Progress Notes (Signed)
Called Dr Wilford Corner to notify of 12 beats VT and that she was asymtomatic and vitals were stable and she was not in distress.  He acknowledged that and even noted that has labs due this morning.  Also informed him that she has been having continous liquid stools after receiving bolus feeding of 474 ml of feedings.  He did give order for a fecal containment system to help to contain it, as she does get her hands all in it and has been smearing feces on her bed rails, on her face and legs, and all over herself and nothing is keeping her from doing it.  Stool does not have that distinct C. Diff smell and it started after she received her bolus feeds and her stomach was rumbling and she did say she felt full in the stomach and then it was just pouring out.

## 2019-04-21 NOTE — Progress Notes (Signed)
Tele just called to say that pt had been having ST depression in MCL lead 3-2 at 2243 last noc and 2-4 ST depression in V-lead at 0515 this morning and 2-3 off and on throughout the night in MCL and V-leads. Dr Wilford Corner was notified and he called back and said to get an EKG.  She still remains asymptomatic and shows no signs of distress.

## 2019-04-21 NOTE — Plan of Care (Signed)
Pt having diarrhea with bolus TF. Multiple episodes. Agitated and confused too per RN. Requested flexiseal - ordered. If concern arises going forward, consider alternate TF, also consider cdiff testing if appropriate but for now not foul smelling stools and no abx - so not sending out Cdiff test.  -- Milon Dikes, MD [facility] Pager: [phone] If 7pm to 7am, please call on call as listed on AMION.

## 2019-04-21 NOTE — Plan of Care (Signed)
Notified by the RN that the patient had 12 beats of VT overnight was asymptomatic. Notified again at 6:44 AM that she was called by central telemetry regarding some ST depressions noted in one lead at 10:40 PM last night and another one at 5 something this morning. Patient remains asymptomatic.  -Sodium 156, unchanged from yesterday.  Potassium 4.0.  Creatinine 0.61.  No leukocytosis.  Normal hemoglobin.  Platelets clumped-not reported.   Recommendations: -Twelve-lead EKG -Check troponin  Stroke team to follow  -- Milon Dikes, MD [facility] Pager: [phone] If 7pm to 7am, please call on call as listed on AMION.

## 2019-04-21 NOTE — Progress Notes (Signed)
STROKE TEAM PROGRESS NOTE   INTERVAL HISTORY Pt sitting in bed. restless, she had 2 episodes of asymptomatic V. tach overnight.  Cardiac troponins trending slightly up.  EKG shows right bundle branch block and right atrial hypertrophy.  Blood pressure slightly elevated.  Vitals:   04/21/19 0428 04/21/19 0815 04/21/19 0945 04/21/19 1147  BP: (!) 152/98 (!) 183/75 (!) 171/60 (!) 113/53  Pulse: 76 99 80 76  Resp: 16 19 14 13   Temp: 98.5 F (36.9 C) 97.7 F (36.5 C)    TempSrc: Oral Axillary  Axillary  SpO2: 97% 99% 98% 98%  Weight:      Height:        CBC:  Recent Labs  Lab 04/17/19 2204 04/19/19 0615 04/20/19 0655 04/21/19 0600  WBC 12.7*   < > 10.7* 10.4  NEUTROABS 10.1*  --   --   --   HGB 13.5   < > 13.6 13.4  HCT 43.7   < > 44.5 43.2  MCV 87.4   < > 89.9 89.8  PLT 422*   < > 368 PLATELET CLUMPS NOTED ON SMEAR, UNABLE TO ESTIMATE   < > = values in this interval not displayed.    Basic Metabolic Panel:  Recent Labs  Lab 04/20/19 0655 04/20/19 0655 04/20/19 1227 04/20/19 1612 04/20/19 1841 04/21/19 0600  NA 156*   < > 156*  --   --  156*  K 3.5  --   --   --   --  4.0  CL 121*  --   --   --   --  122*  CO2 23  --   --   --   --  23  GLUCOSE 110*  --   --   --   --  115*  BUN 13  --   --   --   --  16  CREATININE 0.69  --   --   --   --  0.61  CALCIUM 8.8*  --   --   --   --  8.5*  MG  --   --   --    < > 1.9 2.1  PHOS  --   --   --    < > 3.9 4.5   < > = values in this interval not displayed.   Lipid Panel:     Component Value Date/Time   CHOL 140 04/18/2019 0245   TRIG 88 04/18/2019 0245   HDL 45 04/18/2019 0245   CHOLHDL 3.1 04/18/2019 0245   VLDL 18 04/18/2019 0245   LDLCALC 77 04/18/2019 0245   HgbA1c:  Lab Results  Component Value Date   HGBA1C 5.9 (H) 04/18/2019   Urine Drug Screen:     Component Value Date/Time   LABOPIA NONE DETECTED 04/20/2019 0223   COCAINSCRNUR NONE DETECTED 04/20/2019 0223   LABBENZ NONE DETECTED 04/20/2019 0223    AMPHETMU NONE DETECTED 04/20/2019 0223   THCU NONE DETECTED 04/20/2019 0223   LABBARB NONE DETECTED 04/20/2019 0223    Alcohol Level     Component Value Date/Time   ETH <10 04/17/2019 2204    IMAGING past 48 hours DG Foot 2 Views Right  Result Date: 04/20/2019 CLINICAL DATA:  Foot pain. Prior amputation of the first and fourth toe EXAM: RIGHT FOOT - 2 VIEW COMPARISON:  None. FINDINGS: Amputation of the first toe at the midshaft proximal phalanx. Amputation of the fourth digit at the metatarsal phalangeal joint. There several bone fragments  at the base of the proximal phalanx which are presumed related to prior surgery. There is no evidence of acute osseous erosion within the phalanges or metatarsals. Midfoot and hindfoot appear normal. IMPRESSION: No acute osseous findings. Electronically Signed   By: Genevive Bi M.D.   On: 04/20/2019 14:14    PHYSICAL EXAM  Temp:  [97.7 F (36.5 C)-98.6 F (37 C)] 97.7 F (36.5 C) (01/16 0815) Pulse Rate:  [71-99] 76 (01/16 1147) Resp:  [12-22] 13 (01/16 1147) BP: (113-183)/(53-98) 113/53 (01/16 1147) SpO2:  [96 %-100 %] 98 % (01/16 1147)  General - Well nourished, well developed, middle-aged Caucasian lady Ophthalmologic - fundi not visualized due to noncooperation.  Cardiovascular - Regular rhythm and rate.  Neuro - awake alert, follows simple commands. Fluent language but moderate to severe dysarthia, follows commands on the right, hemineglect to the left, anosognosia. Eyes right gaze deviation, barely cross midline. Not blinking to visual threat on the left, left facial droop, left eye closing less strong than the right. Tongue midline. LUE no spontaneous movement, on pain extension posturing. LLE no spontaneous movement, slight withdraw to pain. RUE and RLE 5/5, following commands. Left hemisensory neglect, able to react to pain stimulation. Left babinski positive. Sensation, coordination and gait not tested.   ASSESSMENT/PLAN Ms.  Angela Alvarez is a 64 y.o. female with history of peripheral vascular disease status post toe amputations, headaches, COPD found down at home, presenting with L sided weakness and speech difficulties.   Stroke:   Large R MCA territory infarct, embolic pattern, could be secondary to large vessel disease source (athero vs. dissection). However, cardioembolic source can not completely ruled out   CT head R MCA occlusion and portions of R supraclinoid ICA w/ large R MCA territory infarct. Mass effect w/ 54mm midline shift.   CTA head & neck R ICA occlusion at bifurcation to terminus. Right MCA occlusion with scant collateral flow. Mild L ICA bifurcation atherosclerosis. Severe R VA origin 80% stenosis. L V4 at 50% stenosis. Large evolving R MCA territory infarct w/ mass effect and midline shift. Emphysema.   MRI not able to perform this time due to agitation  Repeat CT 1/14 R MCA infarct w/ stable 37mm midline shift, mild petechial hemorrhage  2D Echo EF 50-55%. No source of embolus   LE venous doppler No DVT   Consider 30 day cardiac event monitoring as outpt if work up neg  LDL 77  HgbA1c 5.9  UDS neg   Lovenox 40 mg sq daily for VTE prophylaxis   aspirin 81 mg daily and Xarelto (rivaroxaban) daily prior to admission, now ASA 325 and plavix 75 DAPT for 3 months and then plavix alone.  Therapy recommendations:  CIR  Disposition:  pending   Cerebral Edema Induced Hypernatremia  Started on 3% protocol  Off 3% now on NS  Na 141-144-148-159-159-160-158-157-156->156  Gradually allow Na trending down  Repeat CT 1/14 stable MLS  HTN  Home meds:  none  BP slightly elevated 140-180s  Put on amlodipine 10 . Gradually normalize in 3-5 days . Long-term BP goal 130-150 given right ICA and MCA occlusion (SBP 113 - 183 today)  Hyperlipidemia  Home meds:  lipitor 40 and zetia 10  LDL 77, goal < 70  Resumed lipitor and zetia   Continue statin and zetia at  discharge  Dysphagia . Secondary to stroke . NPO . Speech on board . Failed MBSS - meds and diet per alternative means recommended  . 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 Terrill Mohr coloration at amputation sites  Tenderness to touch, especially near ankle   Not sure if pt fell before down on the floor  X-ray of right foot - 04/20/19 - No acute osseous findings.  ABI testing right LE - pending  May consider right foot MRI to rule out osteomyelitis if clinically indicated  Other Stroke Risk Factors  Former Cigarette smoker, quit 3 yrs ago  Family hx stroke (mother at age 31)  Migraines  PVD s/p toe amputation (R 1st toe) - was on ASA 81 and Xarelto 2.5 daily PTA  Other Active Problems  COPD  Leukocytosis 12.7->10.7->10.4 (afebrile). CXR ok.; UA neg ; Rt foot x-ray - No acute osseous findings  Elevated troponin 34->41  Hypokalemia K 3.2 - supplement - 3.5->4.0  See Plan of Care note Dr Wilford Corner - early this AM - 12 beats NSVT - asx - ECG today ST rate 118 - RBBB (not new) - will repeat troponin level.  Hospital day # 3  The patient unfortunately has a large right MCA infarct which is fairly disabling.  She is having asymptomatic runs of V. tach and slightly elevated cardiac enzymes.  Will get medical hospitalist team to consult to help with medical management of this.  I spoke to the patient's nephew at the bedside about her prognosis he asked me to talk to the patient's sister who should be the decision maker.  I also called her Sister Bonita Quin who stated that the patient was full of life and very social and would not want to live her life of disability have a feeding tube and go to a nursing home which seems unavoidable.  Family agrees to full comfort care measures only and DNR.  Will place orders.  Greater than 50% time during the 35-minute visit was spent on counseling and  coordination of care and discussion with family and Dr. Jonah Blue.  Delia Heady, MD    To contact Stroke Continuity provider, please refer to [email or web address]. After hours, contact General Neurology

## 2019-04-21 NOTE — NC FL2 (Signed)
[place] MEDICAID FL2 LEVEL OF CARE SCREENING TOOL     IDENTIFICATION  Patient Name: Angela Alvarez Birthdate: 1955-10-11 Sex: female Admission Date (Current Location): 04/17/2019  [facility] and [place] Number:  Producer, television/film/video and Address:  [facility]. [address] [facility], [address], [place], [place] [address]      Provider Number: [id number]  Attending Physician Name and Address:  Marvel Plan, MD  Relative Name and Phone Number:       Current Level of Care: Hospital Recommended Level of Care: Skilled Nursing Facility Prior Approval Number:    Date Approved/Denied:   PASRR Number:    Discharge Plan: SNF    Current Diagnoses: Patient Active Problem List   Diagnosis Date Noted  . Acute ischemic stroke (HCC) 04/18/2019  . PVD (peripheral vascular disease) (HCC)   . Chronic obstructive pulmonary disease (HCC)   . Essential hypertension   . Tachypnea   . Prediabetes   . Leukocytosis   . Dyspnea on exertion 06/30/2015  . Atherosclerosis of native arteries of extremities with gangrene, unspecified extremity (HCC) 05/19/2015  . Critical lower limb ischemia 04/09/2015  . ASCUS with positive high risk human papillomavirus of vagina 01/09/2014  . Foot pain 12/04/2010  . Metatarsalgia 12/04/2010  . Right knee pain 11/23/2010  . SMOKER 01/08/2010  . COPD 01/07/2010    Orientation RESPIRATION BLADDER Height & Weight     Situation, Self, Time  Normal Incontinent, External catheter(cath placed 04/20/19) Weight: 154 lb 8.7 oz (70.1 kg) Height:  5\' 6"  (167.6 cm)  BEHAVIORAL SYMPTOMS/MOOD NEUROLOGICAL BOWEL NUTRITION STATUS      Incontinent Diet, Feeding tube(NPO at this time)  AMBULATORY STATUS COMMUNICATION OF NEEDS Skin   Extensive Assist Verbally PU Stage and Appropriate Care(PU right ankle. Open wound on right toe.)                       Personal Care Assistance Level of Assistance  Dressing, Feeding, Bathing Bathing Assistance: Maximum  assistance Feeding assistance: Independent Dressing Assistance: Maximum assistance     Functional Limitations Info  Sight, Hearing, Speech Sight Info: Adequate Hearing Info: Adequate Speech Info: Adequate    SPECIAL CARE FACTORS FREQUENCY  PT (By licensed PT), OT (By licensed OT)     PT Frequency: 5x OT Frequency: 5x            Contractures Contractures Info: Not present    Additional Factors Info  Code Status, Allergies Code Status Info: Full Code Allergies Info: Amoxicillin-pot Clavulanate, Levofloxacin           Current Medications (04/21/2019):  This is the current hospital active medication list Current Facility-Administered Medications  Medication Dose Route Frequency Provider Last Rate Last Admin  . 0.9 %  sodium chloride infusion   Intravenous Continuous 04/23/2019, MD 40 mL/hr at 04/21/19 0944 New Bag at 04/21/19 0944  . 0.9 %  sodium chloride infusion   Intravenous PRN 04/23/19, MD   Stopped at 04/19/19 2120  . acetaminophen (TYLENOL) tablet 650 mg  650 mg Oral Q4H PRN 2121, MD       Or  . acetaminophen (TYLENOL) 160 MG/5ML solution 650 mg  650 mg Per Tube Q4H PRN Milon Dikes, MD       Or  . acetaminophen (TYLENOL) suppository 650 mg  650 mg Rectal Q4H PRN Milon Dikes, MD      . amLODipine (NORVASC) tablet 10 mg  10 mg Per Tube Daily Milon Dikes,  MD   10 mg at 04/21/19 0944  . aspirin tablet 325 mg  325 mg Per Tube Daily Rosalin Hawking, MD   325 mg at 04/21/19 0944  . atorvastatin (LIPITOR) tablet 40 mg  40 mg Per Tube QHS Rosalin Hawking, MD   40 mg at 04/20/19 2212  . chlorhexidine (PERIDEX) 0.12 % solution 15 mL  15 mL Mouth Rinse BID Amie Portland, MD   15 mL at 04/21/19 0944  . Chlorhexidine Gluconate Cloth 2 % PADS 6 each  6 each Topical Daily Amie Portland, MD   6 each at 04/21/19 0945  . clopidogrel (PLAVIX) tablet 75 mg  75 mg Per Tube Daily Rosalin Hawking, MD   75 mg at 04/21/19 0944  . enoxaparin (LOVENOX) injection 40 mg  40 mg Subcutaneous  Q24H Rosalin Hawking, MD   40 mg at 04/21/19 0951  . ezetimibe (ZETIA) tablet 10 mg  10 mg Per Tube Daily Rosalin Hawking, MD   10 mg at 04/21/19 0944  . feeding supplement (OSMOLITE 1.2 CAL) liquid 474 mL  474 mL Per Tube TID WC Rosalin Hawking, MD   474 mL at 04/21/19 1220  . feeding supplement (PRO-STAT SUGAR FREE 64) liquid 30 mL  30 mL Per Tube Daily Rosalin Hawking, MD   30 mL at 04/21/19 0951  . labetalol (NORMODYNE) injection 5-10 mg  5-10 mg Intravenous Q2H PRN Rosalin Hawking, MD      . MEDLINE mouth rinse  15 mL Mouth Rinse q12n4p Amie Portland, MD   15 mL at 04/21/19 1221  . oxyCODONE (Oxy IR/ROXICODONE) immediate release tablet 5 mg  5 mg Per Tube Q4H PRN Rosalin Hawking, MD   5 mg at 04/21/19 0944  . senna-docusate (Senokot-S) tablet 1 tablet  1 tablet Per Tube QHS PRN Rosalin Hawking, MD         Discharge Medications: Please see discharge summary for a list of discharge medications.  Relevant Imaging Results:  Relevant Lab Results:   Additional Information SSN:[id number]  Eileen Stanford, LCSW

## 2019-04-21 NOTE — Consult Note (Signed)
Medical Consultation   Angela Alvarez  BPZ:[id number]  DOB: [birth year]  DOA: 04/17/2019  PCP: Pearson Grippe, MD   Outpatient Specialists: Randie Heinz - vascular; Leanord Hawking - wound care   Requesting physician: Pearlean Brownie - neurology  Reason for consultation: Poor prognosis from large R MCA stroke.  Elevated troponin, 34, 41.  Trying to convince family to make her comfort care.   History of Present Illness: Angela Alvarez is an 64 y.o. female with h/o PVD s/p R foot amputations; and COPD who presented on 1/12 for CVA.  She is unable to answer questions.  She mumbles non-sensical speech and moves her right leg and right arm but her left side appears to be flaccid.  She has recurrent diarrhea and her Brooke Pace is out of place.  She can apparently tell this, since she is repeatedly lifting her leg and pointing towards her buttocks and the stool on the pad underneath her.   Review of Systems:  ROS As per HPI otherwise 10 point review of systems negative.    Past Medical History: Past Medical History:  Diagnosis Date  . Abnormal Pap smear of cervix 06-01-13   ascus pap:pos.HR HPV/hx of cryotherapy to cx 1985  . Arthritis    knee  . Asthma   . COPD (chronic obstructive pulmonary disease) (HCC)   . Critical lower limb ischemia    ischemic appearing left first toe  . Headache    MMigraines  . Peripheral vascular disease [facility])     Past Surgical History: Past Surgical History:  Procedure Laterality Date  . ABDOMINAL AORTOGRAM W/LOWER EXTREMITY Bilateral 09/11/2018   Procedure: ABDOMINAL AORTOGRAM W/LOWER EXTREMITY;  Surgeon: Maeola Harman, MD;  [facility]: [facility] INVASIVE CV LAB;  Service: Cardiovascular;  Laterality: Bilateral;  . AMPUTATION Right 05/19/2015   Procedure: RIGHT FIRST RAY AMPUTATION;  Surgeon: Sherren Kerns, MD;  Location: [facility] OR;  Service: Vascular;  Laterality: Right;  . AMPUTATION Right 02/28/2019   Procedure: AMPUTATION DIGIT - RIGHT FOURTH TOE;   Surgeon: Maeola Harman, MD;  Location: [facility] OR;  Service: Vascular;  Laterality: Right;  . BREAST SURGERY Right    cyst  . BUNIONECTOMY Bilateral    bilateral  . CARDIAC CATHETERIZATION Bilateral    Catarct  . COLONOSCOPY W/ POLYPECTOMY    . KNEE ARTHROPLASTY Right 07/21/2017  . PERIPHERAL VASCULAR CATHETERIZATION N/A 04/28/2015   Procedure: Lower Extremity Angiography;  Surgeon: Runell Gess, MD;  Location: [facility] INVASIVE CV LAB;  Service: Cardiovascular;  Laterality: N/A;  . PERIPHERAL VASCULAR INTERVENTION Left 09/11/2018   Procedure: PERIPHERAL VASCULAR INTERVENTION;  Surgeon: Maeola Harman, MD;  Location[facility] INVASIVE CV LAB;  Service: Cardiovascular;  Laterality: Left;  SFA stent  . surgery under arm Right    --benign breast tissue     Allergies:   Allergies  Allergen Reactions  . Amoxicillin-Pot Clavulanate Nausea And Vomiting    Did it involve swelling of the face/tongue/throat, SOB, or low BP? No Did it involve sudden or severe rash/hives, skin peeling, or any reaction on the inside of your mouth or nose? No Did you need to seek medical attention at a hospital or doctor's office? No When did it last happen?3 years If all above answers are "NO", may proceed with cephalosporin use.   . Levofloxacin Nausea And Vomiting     Social History:  reports that she quit smoking about 3 years ago. Her smoking use included cigarettes.  She has a 45.00 pack-year smoking history. She has never used smokeless tobacco. She reports that she does not drink alcohol or use drugs.   Family History: Family History  Problem Relation Age of Onset  . Stroke Mother        dec age 45  . Hypertension Mother   . Cancer Father        lung age 47  . Cancer Sister        dec colon ca age 55  . Hypertension Sister   . Diabetes Neg Hx   . Heart attack Neg Hx   . Hyperlipidemia Neg Hx   . Sudden death Neg Hx       Physical Exam: Vitals:   04/21/19 0428 04/21/19  0815 04/21/19 0945 04/21/19 1147  BP: (!) 152/98 (!) 183/75 (!) 171/60 (!) 113/53  Pulse: 76 99 80 76  Resp: 16 19 14 13   Temp: 98.5 F (36.9 C) 97.7 F (36.5 C)    TempSrc: Oral Axillary  Axillary  SpO2: 97% 99% 98% 98%  Weight:      Height:        Constitutional: Alert and awake, aphasic with regards to sensical speech. Eyes: EOMI, irises appear normal, anicteric sclera,  ENMT: external ears and nose appear normal, normal hearing, Lips appear normal Neck: neck appears normal, no masses, normal ROM, no thyromegaly, no JVD  CVS: S1-S2 clear, no murmur rubs or gallops, no LE edema, normal pedal pulses  Respiratory:  clear to auscultation bilaterally, no wheezing, rales or rhonchi. Respiratory effort normal. No accessory muscle use.  Abdomen: soft nontender, nondistended Musculoskeletal: : left hemiparesis Neuro: unable to perform Psych: appears to localize wet/stooled bedding but unable to express herself or meaningfully converse   Data reviewed:  I have personally reviewed the recent labs and imaging studies  Pertinent Labs:   Na++ 156 Glucose 115, 99, 258, 260 HS troponin 34, 41 Normal CBC   Inpatient Medications:   Scheduled Meds: . amLODipine  10 mg Per Tube Daily  . aspirin  325 mg Per Tube Daily  . atorvastatin  40 mg Per Tube QHS  . chlorhexidine  15 mL Mouth Rinse BID  . Chlorhexidine Gluconate Cloth  6 each Topical Daily  . clopidogrel  75 mg Per Tube Daily  . enoxaparin (LOVENOX) injection  40 mg Subcutaneous Q24H  . ezetimibe  10 mg Per Tube Daily  . feeding supplement (OSMOLITE 1.2 CAL)  474 mL Per Tube TID WC  . feeding supplement (PRO-STAT SUGAR FREE 64)  30 mL Per Tube Daily  . mouth rinse  15 mL Mouth Rinse q12n4p   Continuous Infusions: . sodium chloride 40 mL/hr at 04/21/19 0944  . sodium chloride Stopped (04/19/19 2120)     Radiological Exams on Admission: DG Foot 2 Views Right  Result Date: 04/20/2019 CLINICAL DATA:  Foot pain. Prior  amputation of the first and fourth toe EXAM: RIGHT FOOT - 2 VIEW COMPARISON:  None. FINDINGS: Amputation of the first toe at the midshaft proximal phalanx. Amputation of the fourth digit at the metatarsal phalangeal joint. There several bone fragments at the base of the proximal phalanx which are presumed related to prior surgery. There is no evidence of acute osseous erosion within the phalanges or metatarsals. Midfoot and hindfoot appear normal. IMPRESSION: No acute osseous findings. Electronically Signed   By: 04/22/2019 M.D.   On: 04/20/2019 14:14    Impression/Recommendations Active Problems:   Acute ischemic stroke (HCC)  PVD (peripheral vascular disease) (HCC)   Chronic obstructive pulmonary disease (HCC)   Essential hypertension   Tachypnea   Prediabetes   Leukocytosis  -Patient with large CVA -TRH was asked to consult for medical management. -However, Dr. Leonie Man spoke with the patient's sister and she has decided to make the patient comfort care. -As such, there does not appear to be a significant role for TRH involvement; please call back should the circumstances change.   Thank you for this consultation.  Our [facility] hospitalist team will sign off at this time.   Time Spent: 40 minutes  Karmen Bongo M.D. [facility] 04/21/2019, 3:07 PM

## 2019-04-22 NOTE — Progress Notes (Signed)
STROKE TEAM PROGRESS NOTE   INTERVAL HISTORY I had a conversation with the patient's sister yesterday who agreed to full comfort care measures.  Patient is lying comfortably in bed.  She is drowsy but can be easily aroused.  She has dysarthria but can be understood.  She follows commands on the right side.  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  Vitals:   04/22/19 0326 04/22/19 0335 04/22/19 0403 04/22/19 0900  BP: (!) 157/70 (!) 177/81 (!) 155/64 (!) 167/59  Pulse: 93 95 89 89  Resp: 18 19    Temp: (!) 97.5 F (36.4 C) 97.6 F (36.4 C) 98.2 F (36.8 C) 98.8 F (37.1 C)  TempSrc: Oral Oral Oral Oral  SpO2: 99% 100% 99% 99%  Weight:      Height:        CBC:  Recent Labs  Lab 04/17/19 2204 04/19/19 0615 04/20/19 0655 04/21/19 0600  WBC 12.7*   < > 10.7* 10.4  NEUTROABS 10.1*  --   --   --   HGB 13.5   < > 13.6 13.4  HCT 43.7   < > 44.5 43.2  MCV 87.4   < > 89.9 89.8  PLT 422*   < > 368 PLATELET CLUMPS NOTED ON SMEAR, UNABLE TO ESTIMATE   < > = values in this interval not displayed.    Basic Metabolic Panel:  Recent Labs  Lab 04/20/19 0655 04/20/19 0655 04/20/19 1227 04/20/19 1612 04/20/19 1841 04/21/19 0600  NA 156*   < > 156*  --   --  156*  K 3.5  --   --   --   --  4.0  CL 121*  --   --   --   --  122*  CO2 23  --   --   --   --  23  GLUCOSE 110*  --   --   --   --  115*  BUN 13  --   --   --   --  16  CREATININE 0.69  --   --   --   --  0.61  CALCIUM 8.8*  --   --   --   --  8.5*  MG  --   --   --    < > 1.9 2.1  PHOS  --   --   --    < > 3.9 4.5   < > = values in this interval not displayed.   Lipid Panel:     Component Value Date/Time   CHOL 140 04/18/2019 0245   TRIG 88 04/18/2019 0245   HDL 45 04/18/2019 0245   CHOLHDL 3.1 04/18/2019 0245   VLDL 18 04/18/2019 0245   LDLCALC 77 04/18/2019 0245   HgbA1c:  Lab Results  Component Value Date   HGBA1C 5.9 (H) 04/18/2019   Urine Drug Screen:      Component Value Date/Time   LABOPIA NONE DETECTED 04/20/2019 0223   COCAINSCRNUR NONE DETECTED 04/20/2019 0223   LABBENZ NONE DETECTED 04/20/2019 0223   AMPHETMU NONE DETECTED 04/20/2019 0223   THCU NONE DETECTED 04/20/2019 0223   LABBARB NONE DETECTED 04/20/2019 0223    Alcohol Level     Component Value Date/Time   ETH <10 04/17/2019 2204    IMAGING past 48 hours DG Foot 2 Views Right  Result Date: 04/20/2019 CLINICAL DATA:  Foot pain. Prior amputation of the first and fourth toe EXAM:  RIGHT FOOT - 2 VIEW COMPARISON:  None. FINDINGS: Amputation of the first toe at the midshaft proximal phalanx. Amputation of the fourth digit at the metatarsal phalangeal joint. There several bone fragments at the base of the proximal phalanx which are presumed related to prior surgery. There is no evidence of acute osseous erosion within the phalanges or metatarsals. Midfoot and hindfoot appear normal. IMPRESSION: No acute osseous findings. Electronically Signed   By: Genevive Bi M.D.   On: 04/20/2019 14:14    PHYSICAL EXAM  Temp:  [97.5 F (36.4 C)-98.8 F (37.1 C)] 98.8 F (37.1 C) (01/17 0900) Pulse Rate:  [76-100] 89 (01/17 0900) Resp:  [13-19] 19 (01/17 0335) BP: (113-177)/(53-81) 167/59 (01/17 0900) SpO2:  [96 %-100 %] 99 % (01/17 0900)  General - Well nourished, well developed, middle-aged Caucasian lady Ophthalmologic - fundi not visualized due to noncooperation.  Cardiovascular - Regular rhythm and rate.  Neuro -drowsy but can be aroused t, follows simple commands.t moderate to severe dysarthia, follows commands on the right, hemineglect to the left, anosognosia. Eyes right gaze deviation, barely cross midline. Not blinking to visual threat on the left, left facial droop, left eye closing less strong than the right. Tongue midline. LUE no spontaneous movement, on pain extension posturing. LLE no spontaneous movement, slight withdraw to pain. RUE and RLE 5/5, following commands.  Left hemisensory neglect, able to react to pain stimulation. Left babinski positive. Sensation, coordination and gait not tested.   ASSESSMENT/PLAN Ms. Selenia Mihok is a 64 y.o. female with history of peripheral vascular disease status post toe amputations, headaches, COPD found down at home, presenting with L sided weakness and speech difficulties.   Stroke:   Large R MCA territory infarct, embolic pattern, could be secondary to large vessel disease source (athero vs. dissection). However, cardioembolic source can not completely ruled out   CT head R MCA occlusion and portions of R supraclinoid ICA w/ large R MCA territory infarct. Mass effect w/ 37mm midline shift.   CTA head & neck R ICA occlusion at bifurcation to terminus. Right MCA occlusion with scant collateral flow. Mild L ICA bifurcation atherosclerosis. Severe R VA origin 80% stenosis. L V4 at 50% stenosis. Large evolving R MCA territory infarct w/ mass effect and midline shift. Emphysema.   MRI not able to perform this time due to agitation  Repeat CT 1/14 R MCA infarct w/ stable 78mm midline shift, mild petechial hemorrhage  2D Echo EF 50-55%. No source of embolus   LE venous doppler No DVT   LDL 77  HgbA1c 5.9  UDS neg   Lovenox 40 mg sq daily for VTE prophylaxis   aspirin 81 mg daily and Xarelto (rivaroxaban) daily prior to admission, now ASA 325 and plavix 75 DAPT for 3 months and then plavix alone.  Therapy recommendations: Hospice nursing home disposition:  pending   Cerebral Edema Induced Hypernatremia  Started on 3% protocol  Off 3% now on NS  Na [id number]->156  Gradually allow Na trending down  Repeat CT 1/14 stable MLS  HTN  Home meds:  none  BP slightly elevated 140-180s  Put on amlodipine 10 . Gradually normalize in 3-5 days . Long-term BP goal 130-150 given right ICA and MCA occlusion (SBP 113 - 183 today)  Hyperlipidemia  Home meds:  lipitor 40 and zetia  10  LDL 77, goal < 70  Resumed lipitor and zetia   Continue statin and zetia at discharge  Dysphagia . Secondary to stroke . NPO .  Speech on board . Failed MBSS - meds and diet per alternative means recommended  . 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 Campbell Riches coloration at amputation sites  Tenderness to touch, especially near ankle   Not sure if pt fell before down on the floor  X-ray of right foot - 04/20/19 - No acute osseous findings.  ABI testing right LE - pending  May consider right foot MRI to rule out osteomyelitis if clinically indicated  Other Stroke Risk Factors  Former Cigarette smoker, quit 3 yrs ago  Family hx stroke (mother at age 53)  Migraines  PVD s/p toe amputation (R 1st toe) - was on ASA 81 and Xarelto 2.5 daily PTA  Other Active Problems  COPD  Leukocytosis 12.7->10.7->10.4 (afebrile). CXR ok.; UA neg ; Rt foot x-ray - No acute osseous findings  Elevated troponin 34->41  Hypokalemia K 3.2 - supplement - 3.5->4.0  See Plan of Care note Dr Rory Percy - early this AM - 12 beats NSVT - asx - ECG today ST rate 118 - RBBB (not new) - will repeat troponin level.  Hospital day # 4  The patient unfortunately has a large right MCA infarct which is fairly disabling.   I spoke to the patient's sister Vaughan Basta yesterday who stated that the patient was full of life and very social and would not want to live her life of disability have a feeding tube and go to a nursing home which seems unavoidable.  Family agrees to full comfort care measures only and DNR.  Will place orders.  Will start comfort feeds as she is awake enough.  Social work consult to transfer to [facility] home greater than 50% time during the 25-minute visit was spent on counseling and coordination of care and discussion with family .  Discussed with Education officer, museum.  Antony Contras, MD   To contact Stroke Continuity provider, please refer to [email or web address] After hours, contact General Neurology

## 2019-04-22 NOTE — Progress Notes (Signed)
[facility] - Civil engineer, contracting  Received request from [facility] for patient/family interest in [place]. Chart reviewed and eligibility confirmed by hospice physician. Spoke with patient's sister by phone to confirm interest. She is agreeable for patient to transfer to [facility] Monday, 04/23/2019. She is aware an Production manager will contact her tomorrow to determine a time to complete paperwork via [employer]. She was not available to complete paperwork today.   Once paperwork is complete, please send discharge summary to [phone].  RN please call report to [phone] prior to patient leaving the unit.  Thank you,  Forrestine Him, LCSW [phone]  [facility] [place] are listed daily on [facility] under [facility]

## 2019-04-23 DIAGNOSIS — G43909 Migraine, unspecified, not intractable, without status migrainosus: Secondary | ICD-10-CM | POA: Diagnosis present

## 2019-04-23 DIAGNOSIS — R1310 Dysphagia, unspecified: Secondary | ICD-10-CM

## 2019-04-23 DIAGNOSIS — Z823 Family history of stroke: Secondary | ICD-10-CM

## 2019-04-23 DIAGNOSIS — E785 Hyperlipidemia, unspecified: Secondary | ICD-10-CM | POA: Diagnosis present

## 2019-04-23 DIAGNOSIS — E441 Mild protein-calorie malnutrition: Secondary | ICD-10-CM | POA: Diagnosis present

## 2019-04-23 DIAGNOSIS — G936 Cerebral edema: Secondary | ICD-10-CM | POA: Diagnosis not present

## 2019-04-23 LAB — CULTURE, BLOOD (ROUTINE X 2)
Culture: NO GROWTH
Culture: NO GROWTH
Special Requests: ADEQUATE

## 2019-04-23 NOTE — Progress Notes (Signed)
Nutrition Brief Note  Chart reviewed. Pt currently full comfort care.  No further nutrition interventions warranted at this time.  Please re-consult as needed.   Roslyn Smiling, MS, RD, LDN Pager # [phone] After hours/ weekend pager # [phone]

## 2019-04-23 NOTE — Progress Notes (Signed)
Patient report called to [facility], rectal tube and iv removed from from patient

## 2019-04-23 NOTE — Discharge Summary (Addendum)
Stroke Discharge Summary  Patient ID: Angela Alvarez   MRN: [id number]      DOB: Sep 06, 1955  Date of Admission: 04/17/2019 Date of Discharge: 04/23/2019  Attending Physician:  Micki Riley, MD, Stroke MD Consultant(s):     Jonah Blue MD (Internal Medicine), Maryla Morrow, MD (Physical Medicine & Rehabtilitation) Patient's PCP:  Pearson Grippe, MD  DISCHARGE DIAGNOSIS:  Principal Problem:   Acute ischemic stroke [facility]) Large R MCA territory infarct, embolic pattern, could be secondary to large vessel disease with resultant left hemiplegia, left hemineglect and left gaze palsy Active Problems:   Foot pain   PVD (peripheral vascular disease) (HCC)   Chronic obstructive pulmonary disease (HCC)   Essential hypertension   Tachypnea   Leukocytosis   Cerebral edema (HCC)   Hyperlipidemia   Dysphagia   Mild malnutrition (HCC)   Migraine   Family hx-stroke   Allergies as of 04/23/2019      Reactions   Amoxicillin-pot Clavulanate Nausea And Vomiting   Did it involve swelling of the face/tongue/throat, SOB, or low BP? No Did it involve sudden or severe rash/hives, skin peeling, or any reaction on the inside of your mouth or nose? No Did you need to seek medical attention at a hospital or doctor's office? No When did it last happen?3 years If all above answers are "NO", may proceed with cephalosporin use.   Levofloxacin Nausea And Vomiting      Medication List    STOP taking these medications   Advil PM 200-25 MG Caps Generic drug: Ibuprofen-diphenhydrAMINE HCl   albuterol (2.5 MG/3ML) 0.083% nebulizer solution Commonly known as: PROVENTIL   albuterol 90 MCG/ACT inhaler Commonly known as: PROVENTIL,VENTOLIN   aspirin EC 81 MG tablet   atorvastatin 40 MG tablet Commonly known as: LIPITOR   budesonide-formoterol 160-4.5 MCG/ACT inhaler Commonly known as: SYMBICORT   clobetasol cream 0.05 % Commonly known as: TEMOVATE   doxycycline 100 MG capsule Commonly  known as: MONODOX   ezetimibe 10 MG tablet Commonly known as: ZETIA   HYDROcodone-acetaminophen 5-325 MG tablet Commonly known as: Norco   ibuprofen 200 MG tablet Commonly known as: ADVIL   ondansetron 4 MG tablet Commonly known as: Zofran   Xarelto 2.5 MG Tabs tablet Generic drug: rivaroxaban     Meds for comfort per [facility]  LABORATORY STUDIES CBC    Component Value Date/Time   WBC 10.4 04/21/2019 0600   RBC 4.81 04/21/2019 0600   HGB 13.4 04/21/2019 0600   HCT 43.2 04/21/2019 0600   PLT PLATELET CLUMPS NOTED ON SMEAR, UNABLE TO ESTIMATE 04/21/2019 0600   MCV 89.8 04/21/2019 0600   MCH 27.9 04/21/2019 0600   MCHC 31.0 04/21/2019 0600   RDW 14.8 04/21/2019 0600   LYMPHSABS 1.3 04/17/2019 2204   MONOABS 1.1 (H) 04/17/2019 2204   EOSABS 0.0 04/17/2019 2204   BASOSABS 0.1 04/17/2019 2204   CMP    Component Value Date/Time   NA 156 (H) 04/21/2019 0600   K 4.0 04/21/2019 0600   CL 122 (H) 04/21/2019 0600   CO2 23 04/21/2019 0600   GLUCOSE 115 (H) 04/21/2019 0600   BUN 16 04/21/2019 0600   CREATININE 0.61 04/21/2019 0600   CREATININE 0.73 04/25/2015 1128   CALCIUM 8.5 (L) 04/21/2019 0600   PROT 7.2 04/17/2019 2204   ALBUMIN 3.4 (L) 04/17/2019 2204   AST 30 04/17/2019 2204   ALT 28 04/17/2019 2204   ALKPHOS 73 04/17/2019 2204   BILITOT 1.1 04/17/2019  2204   GFRNONAA >60 04/21/2019 0600   GFRAA >60 04/21/2019 0600   COAGS Lab Results  Component Value Date   INR 1.1 04/17/2019   INR 1.02 05/13/2015   INR 1.00 04/25/2015   Lipid Panel    Component Value Date/Time   CHOL 140 04/18/2019 0245   TRIG 88 04/18/2019 0245   HDL 45 04/18/2019 0245   CHOLHDL 3.1 04/18/2019 0245   VLDL 18 04/18/2019 0245   LDLCALC 77 04/18/2019 0245   HgbA1C  Lab Results  Component Value Date   HGBA1C 5.9 (H) 04/18/2019   Urinalysis    Component Value Date/Time   COLORURINE YELLOW 04/20/2019 0223   APPEARANCEUR CLEAR 04/20/2019 0223   LABSPEC 1.020 04/20/2019  0223   PHURINE 5.0 04/20/2019 0223   GLUCOSEU NEGATIVE 04/20/2019 0223   HGBUR NEGATIVE 04/20/2019 0223   BILIRUBINUR NEGATIVE 04/20/2019 0223   BILIRUBINUR n 06/01/2013 1616   KETONESUR 20 (A) 04/20/2019 0223   PROTEINUR NEGATIVE 04/20/2019 0223   UROBILINOGEN negative 06/01/2013 1616   NITRITE NEGATIVE 04/20/2019 0223   LEUKOCYTESUR SMALL (A) 04/20/2019 0223   Urine Drug Screen     Component Value Date/Time   LABOPIA NONE DETECTED 04/20/2019 0223   COCAINSCRNUR NONE DETECTED 04/20/2019 0223   LABBENZ NONE DETECTED 04/20/2019 0223   AMPHETMU NONE DETECTED 04/20/2019 0223   THCU NONE DETECTED 04/20/2019 0223   LABBARB NONE DETECTED 04/20/2019 0223    Alcohol Level    Component Value Date/Time   [facility] [place] <10 04/17/2019 2204     SIGNIFICANT DIAGNOSTIC STUDIES CT Angio Head W or Wo Contrast  Result Date: 04/18/2019 CLINICAL DATA:  Follow-up examination for acute stroke. EXAM: CT ANGIOGRAPHY HEAD AND NECK TECHNIQUE: Multidetector CT imaging of the head and neck was performed using the standard protocol during bolus administration of intravenous contrast. Multiplanar CT image reconstructions and MIPs were obtained to evaluate the vascular anatomy. Carotid stenosis measurements (when applicable) are obtained utilizing NASCET criteria, using the distal internal carotid diameter as the denominator. CONTRAST:  64mL OMNIPAQUE IOHEXOL 350 MG/ML SOLN COMPARISON:  Prior CT from 04/17/2019. FINDINGS: CTA NECK FINDINGS Aortic arch: Visualized aortic arch of normal caliber with normal 3 vessel morphology. Moderate atherosclerotic change about the arch and origin of the great vessels without hemodynamically significant stenosis. Visualized subclavian arteries widely patent. Right carotid system: Mild scattered atheromatous irregularity within the right CCA without hemodynamically significant stenosis. Possible small penetrating plaque noted at the distal right CCA (series 7, image 226). Mixed atheromatous  disease noted about the right carotid bifurcation. Right ICA occluded at the bifurcation, and remains occluded within the neck. Origin of the right external carotid artery appears occluded as well, but is patent distally, suspected to be through retrograde filling. Left carotid system: Left CCA patent from its origin to the bifurcation without stenosis. Mild atheromatous irregularity about the left bifurcation without hemodynamically significant stenosis. Left ICA patent distally to the skull base without stenosis, dissection or occlusion. Vertebral arteries: Both vertebral arteries arise from the subclavian arteries. Dominant left vertebral artery widely patent within the neck. Severe atheromatous stenosis of approximately 80% seen at the origin of the right vertebral artery (series 7, image 275). Right vertebral otherwise widely patent within the neck as well. Skeleton: No acute osseous abnormality. No discrete osseous lesions. Moderate cervical spondylosis noted at C4-5 through C6-7. Other neck: No other acute soft tissue abnormality within the neck. No mass lesion or adenopathy. Upper chest: Visualized upper chest demonstrates no acute finding. Mild centrilobular emphysema.  Review of the MIP images confirms the above findings CTA HEAD FINDINGS Anterior circulation: Petrous left ICA widely patent. Mild scattered calcified plaque within the cavernous/supraclinoid left ICA without hemodynamically significant stenosis. Left ICA terminus well perfused. Left A1 patent. Normal in patent anterior communicating artery. Both A2 segments well perfused and patent to their distal aspects. Filling of the right A1 to the terminus via collateral flow. Scant attenuated opacification of the right M1 segment and distal MCA branches, likely collateral flow, either across the circle-of-Willis or retrograde in nature. Left M1 widely patent. Normal left MCA bifurcation. Distal left MCA branches well perfused. Posterior circulation:  Focal atheromatous plaque at the left V4 segment as it crosses the dural reflection with short-segment moderate stenosis. Vertebral arteries otherwise widely patent to the vertebrobasilar junction. Posterior inferior cerebral arteries patent bilaterally. Basilar widely patent to its distal aspect without stenosis. Superior cerebral arteries patent bilaterally. Both PCAs well perfused to their distal aspects without stenosis. Small left posterior communicating artery noted. Venous sinuses: Patent. Anatomic variants: None significant. Review of the MIP images confirms the above findings IMPRESSION: 1. 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 Scant attenuated flow within the right MCA via collateral flow across the circle-of-Willis and/or retrograde filling. 2. Mild atheromatous irregularity about the left carotid bifurcation and left carotid siphon without hemodynamically significant stenosis. 3. Severe 80% atheromatous stenosis at the origin of the right vertebral artery. Short-segment 50% stenosis at the proximal left V4 segment. Posterior circulation otherwise widely patent. 4. Large evolving subacute right MCA territory infarct with associated mass effect and midline shift, stable from prior CT. 5.  Emphysema (ICD10-J43.9). Electronically Signed   By: Rise Mu M.D.   On: 04/18/2019 02:04   CT HEAD WO CONTRAST  Result Date: 04/19/2019 CLINICAL DATA:  Stroke follow-up EXAM: CT HEAD WITHOUT CONTRAST TECHNIQUE: Contiguous axial images were obtained from the base of the skull through the vertex without intravenous contrast. COMPARISON:  Two days ago FINDINGS: Brain: Cytotoxic edema in the right MCA territory with unchanged extent. Local mass effect with midline shift measuring 5 mm. Subcentimeter globular high-density along the lower right sylvian fissure with no enhancing aneurysm seen on prior CTA, presumably a small focus of petechial hemorrhage. No new  site of infarct. Vascular: As above Skull: Negative Sinuses/Orbits: Negative IMPRESSION: Right MCA infarct is unchanged in extent with up to 5 mm of midline shift. Mild petechial hemorrhage is stable. Electronically Signed   By: Marnee Spring M.D.   On: 04/19/2019 05:10   CT Head Wo Contrast  Result Date: 04/17/2019 CLINICAL DATA:  64 year old female with focal neurologic deficit. EXAM: CT HEAD WITHOUT CONTRAST TECHNIQUE: Contiguous axial images were obtained from the base of the skull through the vertex without intravenous contrast. COMPARISON:  None. FINDINGS: Brain: There is a large area of low attenuation with loss of gray-white matter discrimination involving the right MCA territory consistent with an acute or subacute infarct. There is associated edema and overall increased volume with associated mass effect and compression of the right cerebral sulci and right lateral ventricle. There is approximately 5 mm right to left midline shift. There is no acute intracranial hemorrhage. Vascular: Large right MCA clot measuring approximately 6 mm. There is probable occlusion of the right supraclinoid ICA. Further evaluation with CT angiography recommended. Skull: Normal. Negative for fracture or focal lesion. Sinuses/Orbits: The visualized paranasal sinuses and mastoid air cells are clear. Other: None IMPRESSION: Occlusion of the right MCA and  possibly portions of the right supraclinoid ICA with a large area of right MCA territory infarct, likely acute or subacute. There is associated mass effect and approximately 5 mm right to left midline shift. No acute intracranial hemorrhage. These results were called by telephone at the time of interpretation on 04/17/2019 at 11:06 pm to Dr. Webb Silversmith who verbally acknowledged these results. Electronically Signed   By: Elgie Collard M.D.   On: 04/17/2019 23:11   CT Angio Neck W and/or Wo Contrast  Result Date: 04/18/2019 CLINICAL DATA:  Follow-up examination for acute  stroke. EXAM: CT ANGIOGRAPHY HEAD AND NECK TECHNIQUE: Multidetector CT imaging of the head and neck was performed using the standard protocol during bolus administration of intravenous contrast. Multiplanar CT image reconstructions and MIPs were obtained to evaluate the vascular anatomy. Carotid stenosis measurements (when applicable) are obtained utilizing NASCET criteria, using the distal internal carotid diameter as the denominator. CONTRAST:  75mL OMNIPAQUE IOHEXOL 350 MG/ML SOLN COMPARISON:  Prior CT from 04/17/2019. FINDINGS: CTA NECK FINDINGS Aortic arch: Visualized aortic arch of normal caliber with normal 3 vessel morphology. Moderate atherosclerotic change about the arch and origin of the great vessels without hemodynamically significant stenosis. Visualized subclavian arteries widely patent. Right carotid system: Mild scattered atheromatous irregularity within the right CCA without hemodynamically significant stenosis. Possible small penetrating plaque noted at the distal right CCA (series 7, image 226). Mixed atheromatous disease noted about the right carotid bifurcation. Right ICA occluded at the bifurcation, and remains occluded within the neck. Origin of the right external carotid artery appears occluded as well, but is patent distally, suspected to be through retrograde filling. Left carotid system: Left CCA patent from its origin to the bifurcation without stenosis. Mild atheromatous irregularity about the left bifurcation without hemodynamically significant stenosis. Left ICA patent distally to the skull base without stenosis, dissection or occlusion. Vertebral arteries: Both vertebral arteries arise from the subclavian arteries. Dominant left vertebral artery widely patent within the neck. Severe atheromatous stenosis of approximately 80% seen at the origin of the right vertebral artery (series 7, image 275). Right vertebral otherwise widely patent within the neck as well. Skeleton: No acute  osseous abnormality. No discrete osseous lesions. Moderate cervical spondylosis noted at C4-5 through C6-7. Other neck: No other acute soft tissue abnormality within the neck. No mass lesion or adenopathy. Upper chest: Visualized upper chest demonstrates no acute finding. Mild centrilobular emphysema. Review of the MIP images confirms the above findings CTA HEAD FINDINGS Anterior circulation: Petrous left ICA widely patent. Mild scattered calcified plaque within the cavernous/supraclinoid left ICA without hemodynamically significant stenosis. Left ICA terminus well perfused. Left A1 patent. Normal in patent anterior communicating artery. Both A2 segments well perfused and patent to their distal aspects. Filling of the right A1 to the terminus via collateral flow. Scant attenuated opacification of the right M1 segment and distal MCA branches, likely collateral flow, either across the circle-of-Willis or retrograde in nature. Left M1 widely patent. Normal left MCA bifurcation. Distal left MCA branches well perfused. Posterior circulation: Focal atheromatous plaque at the left V4 segment as it crosses the dural reflection with short-segment moderate stenosis. Vertebral arteries otherwise widely patent to the vertebrobasilar junction. Posterior inferior cerebral arteries patent bilaterally. Basilar widely patent to its distal aspect without stenosis. Superior cerebral arteries patent bilaterally. Both PCAs well perfused to their distal aspects without stenosis. Small left posterior communicating artery noted. Venous sinuses: Patent. Anatomic variants: None significant. Review of the MIP images confirms the above findings IMPRESSION: 1.  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 Scant attenuated flow within the right MCA via collateral flow across the circle-of-Willis and/or retrograde filling. 2. Mild atheromatous irregularity about the left carotid bifurcation and left carotid  siphon without hemodynamically significant stenosis. 3. Severe 80% atheromatous stenosis at the origin of the right vertebral artery. Short-segment 50% stenosis at the proximal left V4 segment. Posterior circulation otherwise widely patent. 4. Large evolving subacute right MCA territory infarct with associated mass effect and midline shift, stable from prior CT. 5.  Emphysema (ICD10-J43.9). Electronically Signed   By: Rise Mu M.D.   On: 04/18/2019 02:04   CT Cervical Spine Wo Contrast  Result Date: 04/17/2019 CLINICAL DATA:  Possible fall EXAM: CT CERVICAL SPINE WITHOUT CONTRAST TECHNIQUE: Multidetector CT imaging of the cervical spine was performed without intravenous contrast. Multiplanar CT image reconstructions were also generated. COMPARISON:  None. FINDINGS: Alignment: No subluxation Skull base and vertebrae: No acute fracture. No primary bone lesion or focal pathologic process. Soft tissues and spinal canal: No prevertebral fluid or swelling. No visible canal hematoma. Disc levels: Degenerative disc disease from C4-5 through C6-7. Diffuse degenerative facet disease. Upper chest: No acute findings Other: Carotid artery calcifications. IMPRESSION: Degenerative disc and facet disease.  No acute bony abnormality. Electronically Signed   By: Charlett Nose M.D.   On: 04/17/2019 22:57   DG CHEST PORT 1 VIEW  Result Date: 04/18/2019 CLINICAL DATA:  Stroke EXAM: PORTABLE CHEST 1 VIEW COMPARISON:  04/25/2015 FINDINGS: Normal heart size for technique. Negative mediastinal contours. There is no edema, consolidation, effusion, or pneumothorax. No acute osseous finding. IMPRESSION: No evidence of active disease. Electronically Signed   By: Marnee Spring M.D.   On: 04/18/2019 05:03   DG Shoulder Left  Result Date: 04/18/2019 CLINICAL DATA:  Fall with left shoulder bruising. EXAM: LEFT SHOULDER - 2+ VIEW COMPARISON:  None. FINDINGS: There is no evidence of fracture or dislocation. There is no  evidence of arthropathy or other focal bone abnormality. Soft tissues are unremarkable. IMPRESSION: Negative. Electronically Signed   By: Marnee Spring M.D.   On: 04/18/2019 05:01   DG Humerus Left  Result Date: 04/18/2019 CLINICAL DATA:  Fall 3 days ago with left shoulder bruising. EXAM: LEFT HUMERUS - 2+ VIEW COMPARISON:  None. FINDINGS: There is no evidence of acute fracture or malalignment. Lateral left fifth rib fracture which has a chronic and healed appearance. IMPRESSION: Negative. Electronically Signed   By: Marnee Spring M.D.   On: 04/18/2019 05:02   DG Foot 2 Views Right  Result Date: 04/20/2019 CLINICAL DATA:  Foot pain. Prior amputation of the first and fourth toe EXAM: RIGHT FOOT - 2 VIEW COMPARISON:  None. FINDINGS: Amputation of the first toe at the midshaft proximal phalanx. Amputation of the fourth digit at the metatarsal phalangeal joint. There several bone fragments at the base of the proximal phalanx which are presumed related to prior surgery. There is no evidence of acute osseous erosion within the phalanges or metatarsals. Midfoot and hindfoot appear normal. IMPRESSION: No acute osseous findings. Electronically Signed   By: Genevive Bi M.D.   On: 04/20/2019 14:14   DG Swallowing Func-Speech Pathology  Result Date: 04/19/2019 Objective Swallowing Evaluation: Type of Study: MBS-Modified Barium Swallow Study  Patient Details Name: Angela Alvarez MRN: [id number] Date of Birth: 02-22-1956 Today's Date: 04/19/2019 Time: SLP Start Time (ACUTE ONLY): 1145 -SLP Stop Time (ACUTE ONLY): 1215 SLP Time Calculation (min) (ACUTE ONLY): 30 min  Past Medical History: Past Medical History: Diagnosis Date . Abnormal Pap smear of cervix 06-01-13  ascus pap:pos.HR HPV/hx of cryotherapy to cx 1985 . Arthritis   knee . Asthma  . COPD (chronic obstructive pulmonary disease) ([place])  . Critical lower limb ischemia   ischemic appearing left first toe . Headache   MMigraines . Peripheral vascular  disease [facility])  Past Surgical History: Past Surgical History: Procedure Laterality Date . ABDOMINAL AORTOGRAM W/LOWER EXTREMITY Bilateral 09/11/2018  Procedure: ABDOMINAL AORTOGRAM W/LOWER EXTREMITY;  Surgeon: Waynetta Sandy, MD;  Location: [place] [facility];  Service: Cardiovascular;  Laterality: Bilateral; . AMPUTATION Right 05/19/2015  Procedure: RIGHT FIRST RAY AMPUTATION;  Surgeon: Elam Dutch, MD;  Location: [place];  Service: Vascular;  Laterality: Right; . AMPUTATION Right 02/28/2019  Procedure: AMPUTATION DIGIT - RIGHT FOURTH TOE;  Surgeon: Waynetta Sandy, MD;  Location: [place];  Service: Vascular;  Laterality: Right; . BREAST SURGERY Right   cyst . BUNIONECTOMY Bilateral   bilateral . CARDIAC CATHETERIZATION Bilateral   Catarct . COLONOSCOPY W/ POLYPECTOMY   . KNEE ARTHROPLASTY Right 07/21/2017 . PERIPHERAL VASCULAR CATHETERIZATION N/A 04/28/2015  Procedure: Lower Extremity Angiography;  Surgeon: Lorretta Harp, MD;  Location: [place] [facility];  Service: Cardiovascular;  Laterality: N/A; . PERIPHERAL VASCULAR INTERVENTION Left 09/11/2018  Procedure: PERIPHERAL VASCULAR INTERVENTION;  Surgeon: Waynetta Sandy, MD;  Location: [place] [facility];  Service: Cardiovascular;  Laterality: Left;  SFA stent . surgery under arm Right   --benign breast tissue HPI: Pt is a 64 y.o. female found down at home, presenting with L sided weakness and dysarthria. CT showed large R MCA territory infarct with 73mm midline shift; MRI pending. PMH: PVD s/p toe amputations, headaches, COPD  Subjective: Pt seen in radiology for MBS. She required ongoing verbal and tactile stim to maintain alertness. Assessment / Plan / Recommendation CHL IP CLINICAL IMPRESSIONS 04/19/2019 Clinical Impression Pt presents with significantly reduced alertness at the time of MBS (1145), which is much different from her presentation at 0900 this morning. Pt required onging verbal and tactile stimulation to maintain  alertness and attention to task. Pt is at very high aspiration risk, due to level of alertness as well as sensory and motor based dysphagia. Orally, pt exhibits holding, poor bolus formation and posterior propulsion, and premature spillage over the tongue base. Left anterior leakage was noted on nectar and honey thick liquids via teaspoon. Pharyngeal swallow is characterized by trigger of the swallow reflex at the level of the pyriform sinus on nectar thick liquids, and at the vallecular sinus on honey thick liquids and puree textures. This significant delay resulted in frank aspiration of nectar thick liquid during the swallow, with weak ineffective reflexive cough response elicited. No penetration or aspiration was seen on puree or honey thick liquids, however, vallecular and pyriform sinus residue places pt at increased risk of aspiration after the swallow on these textures. NPO status is recommended at this time, with consideration of nonoral feeding methods for nutrition, hydration, and medication. During skilled ST intervention, pt will benefit from trials of puree and thickened liquids at bedside. SLP will continue to follow pt for treatment, and to determine readiness to advance to po intake. RN and MD were informed of results and recommendations.  SLP Visit Diagnosis Dysphagia, oropharyngeal phase (R13.12)     Impact on safety and function Severe aspiration risk;Risk for inadequate nutrition/hydration   CHL IP TREATMENT RECOMMENDATION 04/19/2019 Treatment Recommendations Therapy as outlined in treatment plan below  Prognosis 04/19/2019 Prognosis for Safe Diet Advancement Good Barriers to Reach Goals Cognitive deficits CHL IP DIET RECOMMENDATION 04/19/2019 SLP Diet Recommendations NPO;Alternative means - temporary   Medication Administration Via alternative means   CHL IP OTHER RECOMMENDATIONS 04/19/2019   Oral Care Recommendations Oral care QID Other Recommendations Have oral suction available   CHL IP FOLLOW  UP RECOMMENDATIONS 04/19/2019 Follow up Recommendations Inpatient Rehab   CHL IP FREQUENCY AND DURATION 04/19/2019 Speech Therapy Frequency (ACUTE ONLY) min 2x/week Treatment Duration 2 weeks      CHL IP ORAL PHASE 04/19/2019 Oral Phase Impaired     Oral - Honey Teaspoon Left anterior bolus loss;Weak lingual manipulation;Reduced posterior propulsion;Holding of bolus;Delayed oral transit;Decreased bolus cohesion;Premature spillage   Oral - Nectar Teaspoon Left anterior bolus loss;Weak lingual manipulation;Reduced posterior propulsion;Holding of bolus;Delayed oral transit;Decreased bolus cohesion;Premature spillage   Oral - Puree Weak lingual manipulation;Holding of bolus;Delayed oral transit;Decreased bolus cohesion;Premature spillage    CHL IP PHARYNGEAL PHASE 04/19/2019 Pharyngeal Phase Impaired   Pharyngeal- Honey Teaspoon Delayed swallow initiation-vallecula;Reduced pharyngeal peristalsis;Reduced epiglottic inversion;Reduced anterior laryngeal mobility;Reduced laryngeal elevation;Pharyngeal residue - valleculae;Pharyngeal residue - pyriform   Pharyngeal- Nectar Teaspoon Delayed swallow initiation-pyriform sinuses;Reduced pharyngeal peristalsis;Reduced epiglottic inversion;Reduced anterior laryngeal mobility;Reduced laryngeal elevation;Reduced airway/laryngeal closure;Penetration/Aspiration during swallow;Moderate aspiration;Pharyngeal residue - valleculae;Pharyngeal residue - pyriform Pharyngeal Material enters airway, passes BELOW cords and not ejected out despite cough attempt by patient   Pharyngeal- Puree Delayed swallow initiation-vallecula;Reduced pharyngeal peristalsis;Reduced anterior laryngeal mobility;Reduced laryngeal elevation;Pharyngeal residue - valleculae;Pharyngeal residue - pyriform;Pharyngeal residue - posterior pharnyx    CHL IP CERVICAL ESOPHAGEAL PHASE 04/19/2019 Cervical Esophageal Phase Angela Alvarez, MSP, CCC-SLP Speech Language Pathologist Office: [phone] Pager: [phone] Leigh Aurora 04/19/2019, 1:05 PM              ECHOCARDIOGRAM COMPLETE  Result Date: 04/18/2019   ECHOCARDIOGRAM REPORT   Patient Name:   RAJVI ARMENTOR Date of Exam: 04/18/2019 Medical Rec #:  [id number]         Height:       66.0 in Accession #:    [id number]        Weight:       154.5 lb Date of Birth:  1956/02/11         BSA:          1.79 m Patient Age:    63 years          BP:           168/59 mmHg Patient Gender: F                 HR:           92 bpm. Exam Location:  Inpatient Procedure: 2D Echo Indications:    Stroke 434.91 / I163.9  History:        Patient has no prior history of Echocardiogram examinations.                 Stroke and COPD, Signs/Symptoms:Dyspnea; Risk Factors:Former                 Smoker.  Sonographer:    Jeryl Columbia Referring Phys: [id number] ASHISH ARORA IMPRESSIONS  1. Left ventricular ejection fraction, by visual estimation, is 50 to 55%. The left ventricle has mildly decreased function. There is no left ventricular hypertrophy.  2. Left ventricular diastolic parameters are consistent with Grade I diastolic dysfunction (impaired relaxation).  3. Mildly dilated left ventricular internal cavity size.  4. The left ventricle demonstrates global hypokinesis.  5. Global right ventricle has normal systolic function.The right ventricular size is normal. No increase in right ventricular wall thickness.  6. Left atrial size was normal.  7. Right atrial size was normal.  8. Mild mitral annular calcification.  9. The mitral valve is normal in structure. No evidence of mitral valve regurgitation. No evidence of mitral stenosis. 10. The tricuspid valve is normal in structure. 11. The aortic valve was not well visualized. Aortic valve regurgitation is not visualized. Mild aortic valve stenosis with mean gradient 10 mmHg. 12. TR signal is inadequate for assessing pulmonary artery systolic pressure. 13. The inferior vena cava is normal in size with greater than 50% respiratory variability, suggesting  right atrial pressure of 3 mmHg. 14. Trivial pericardial effusion is present. FINDINGS  Left Ventricle: Left ventricular ejection fraction, by visual estimation, is 50 to 55%. The left ventricle has mildly decreased function. The left ventricle demonstrates global hypokinesis. The left ventricular internal cavity size was mildly dilated left ventricle. There is no left ventricular hypertrophy. Left ventricular diastolic parameters are consistent with Grade I diastolic dysfunction (impaired relaxation). Right Ventricle: The right ventricular size is normal. No increase in right ventricular wall thickness. Global RV systolic function is has normal systolic function. Left Atrium: Left atrial size was normal in size. Right Atrium: Right atrial size was normal in size Pericardium: Trivial pericardial effusion is present. Mitral Valve: The mitral valve is normal in structure. Mild mitral annular calcification. No evidence of mitral valve regurgitation. No evidence of mitral valve stenosis by observation. Tricuspid Valve: The tricuspid valve is normal in structure. Tricuspid valve regurgitation is not demonstrated. Aortic Valve: The aortic valve was not well visualized. Aortic valve regurgitation is not visualized. Mild aortic stenosis is present. Aortic valve mean gradient measures 10.0 mmHg. Aortic valve peak gradient measures 15.8 mmHg. Aortic valve area, by VTI  measures 1.28 cm. Pulmonic Valve: The pulmonic valve was normal in structure. Pulmonic valve regurgitation is not visualized. Pulmonic regurgitation is not visualized. Aorta: The aortic root is normal in size and structure. Venous: The inferior vena cava is normal in size with greater than 50% respiratory variability, suggesting right atrial pressure of 3 mmHg. IAS/Shunts: No atrial level shunt detected by color flow Doppler.  LEFT VENTRICLE PLAX 2D LVIDd:         5.40 cm  Diastology LVIDs:         4.00 cm  LV e' lateral:   6.53 cm/s LV PW:         1.10 cm  LV  E/e' lateral: 12.0 LV IVS:        1.10 cm  LV e' medial:    11.30 cm/s LVOT diam:     2.00 cm  LV E/e' medial:  7.0 LV SV:         71 ml LV SV Index:   39.25 LVOT Area:     3.14 cm  RIGHT VENTRICLE RV S prime:     15.10 cm/s TAPSE (M-mode): 2.4 cm LEFT ATRIUM             Index LA diam:        3.40 cm 1.90 cm/m LA Vol (A2C):   46.8 ml 26.11 ml/m LA Vol (A4C):   53.2 ml 29.68 ml/m LA Biplane Vol: 49.8 ml 27.79 ml/m  AORTIC VALVE AV Area (Vmax):    1.19 cm AV Area (Vmean):   1.04 cm AV Area (VTI):     1.28 cm AV Vmax:  199.00 cm/s AV Vmean:          141.667 cm/s AV VTI:            0.421 m AV Peak Grad:      15.8 mmHg AV Mean Grad:      10.0 mmHg LVOT Vmax:         75.37 cm/s LVOT Vmean:        46.700 cm/s LVOT VTI:          0.172 m LVOT/AV VTI ratio: 0.41  AORTA Ao Root diam: 2.80 cm MITRAL VALVE MV Area (PHT): 5.02 cm              SHUNTS MV PHT:        43.79 msec            Systemic VTI:  0.17 m MV Decel Time: 151 msec              Systemic Diam: 2.00 cm MV E velocity: 78.60 cm/s  103 cm/s MV A velocity: 123.00 cm/s 70.3 cm/s MV E/A ratio:  0.64        1.5  Marca Ancona MD Electronically signed by Marca Ancona MD Signature Date/Time: 04/18/2019/1:59:39 PM    Final    DG Hip Unilat With Pelvis 2-3 Views Left  Result Date: 04/18/2019 CLINICAL DATA:  Fall 3 days ago with bruising to the left hip. EXAM: DG HIP (WITH OR WITHOUT PELVIS) 2-3V LEFT COMPARISON:  None. FINDINGS: There is no evidence of hip fracture or dislocation. There is no evidence of arthropathy or other focal bone abnormality. IMPRESSION: Negative. Electronically Signed   By: Marnee Spring M.D.   On: 04/18/2019 05:02   VAS [place] LOWER EXTREMITY VENOUS (DVT)  Result Date: 04/19/2019  Lower Venous Study Indications: Stroke.  Risk Factors: None identified. Limitations: Poor ultrasound/tissue interface and patient movement, patient positioning, patient cooperation. Comparison Study: No prior studies. Performing Technologist: Chanda Busing RVT  Examination Guidelines: A complete evaluation includes B-mode imaging, spectral Doppler, color Doppler, and power Doppler as needed of all accessible portions of each vessel. Bilateral testing is considered an integral part of a complete examination. Limited examinations for reoccurring indications may be performed as noted.  +---------+---------------+---------+-----------+----------+--------------+ RIGHT    CompressibilityPhasicitySpontaneityPropertiesThrombus Aging +---------+---------------+---------+-----------+----------+--------------+ CFV      Full           Yes      Yes                                 +---------+---------------+---------+-----------+----------+--------------+ SFJ      Full                                                        +---------+---------------+---------+-----------+----------+--------------+ FV Prox  Full                                                        +---------+---------------+---------+-----------+----------+--------------+ FV Mid   Full                                                        +---------+---------------+---------+-----------+----------+--------------+  FV DistalFull                                                        +---------+---------------+---------+-----------+----------+--------------+ PFV      Full                                                        +---------+---------------+---------+-----------+----------+--------------+ POP      Full           Yes      Yes                                 +---------+---------------+---------+-----------+----------+--------------+ PTV      Full                                                        +---------+---------------+---------+-----------+----------+--------------+ PERO     Full                                                        +---------+---------------+---------+-----------+----------+--------------+    +---------+---------------+---------+-----------+----------+--------------+ LEFT     CompressibilityPhasicitySpontaneityPropertiesThrombus Aging +---------+---------------+---------+-----------+----------+--------------+ CFV      Full           Yes      Yes                                 +---------+---------------+---------+-----------+----------+--------------+ SFJ      Full                                                        +---------+---------------+---------+-----------+----------+--------------+ FV Prox  Full                                                        +---------+---------------+---------+-----------+----------+--------------+ FV Mid   Full                                                        +---------+---------------+---------+-----------+----------+--------------+ FV DistalFull                                                        +---------+---------------+---------+-----------+----------+--------------+  PFV      Full                                                        +---------+---------------+---------+-----------+----------+--------------+ POP      Full           Yes      Yes                                 +---------+---------------+---------+-----------+----------+--------------+ PTV      Full                                                        +---------+---------------+---------+-----------+----------+--------------+ PERO     Full                                                        +---------+---------------+---------+-----------+----------+--------------+     Summary: Right: There is no evidence of deep vein thrombosis in the lower extremity. No cystic structure found in the popliteal fossa. Left: There is no evidence of deep vein thrombosis in the lower extremity. No cystic structure found in the popliteal fossa.  *See table(s) above for measurements and observations. Electronically signed by Lemar LivingsBrandon  Cain MD on 04/19/2019 at 3:31:36 PM.    Final       HISTORY OF PRESENT ILLNESS Angela KentCatherine Braddy is a 64 y.o. female past medical history of peripheral vascular disease status post toe amputations, headaches, COPD, presented to the emergency room via EMS because family had not heard from her since Saturday, 04/14/2019 and requested a welfare check. She was found down on the ground in prone position. Brought into the emergency room.  Evaluated by ED providers.  Noted to have left-sided weakness and speech difficulties. Noncontrast head CT showed a completed right MCA territory infarct with possible occlusion of the right MCA and possibly portions of the supraclinoid ICA due to the hyperdensity seen on the noncontrast scan. Neurology was consulted for further recommendations. Patient unable to provide any history. No close family around-recently reconnected with a sister-information unavailable. She was LKW sometime on Saturday 04/14/2019. She did not receive tpa or considered for IR as outside the window, completed stroke. Premorbid modified Rankin scale (mRS): 0.   HOSPITAL COURSE Ms. Angela KentCatherine Alvarez is a 64 y.o. female with history of peripheral vascular disease status post toe amputations, headaches, COPD found down at home, presenting with L sided weakness and speech difficulties.   Stroke:   Large R MCA territory infarct, embolic pattern, could be secondary to large vessel disease source (athero vs. dissection). However, cardioembolic source can not completely ruled out   CT head R MCA occlusion and portions of R supraclinoid ICA w/ large R MCA territory infarct. Mass effect w/ 5mm midline shift.   CTA head & neck R ICA occlusion at bifurcation to terminus. Right MCA occlusion with scant collateral flow. Mild L ICA bifurcation atherosclerosis. Severe R VA origin  80% stenosis. L V4 at 50% stenosis. Large evolving R MCA territory infarct w/ mass effect and midline shift. Emphysema.   MRI not able to  perform this time due to agitation  Repeat CT 1/14 R MCA infarct w/ stable 5mm midline shift, mild petechial hemorrhage  2D Echo EF 50-55%. No source of embolus   LE doppler neg for DBT  LDL 77  HgbA1c 5.9  UDS neg   aspirin 81 mg daily and Xarelto (rivaroxaban) daily prior to admission, in hospital treated with Alvarez 325 and plavix 75 DAPT for 3 months and then plavix alone. Off meds for comfort care  Poor prognosis for meaningful recovery. Family opted for comfort care.  [facility]  Cerebral Edema Induced Hypernatremia  Started on 3% protocol  Off 3% now on NS  Na [id number]->156  Gradually allow Na trending down  Repeat CT 1/14 stable MLS  Essential Hypertension  Home meds:  none  BP slightly elevated 140-180s  Put on amlodipine 10.   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 Terrill Mohrain  Dark coloration at amputation sites  Tenderness to touch, especially near ankle   Not sure if pt fell before down on the floor  X-ray of right foot - 04/20/19 - No acute osseous findings.  LE doppler neg for DVT  ABI testing right LE - ordered but not completed  May consider right foot MRI to rule out osteomyelitis if clinically indicated  Other Stroke Risk Factors  Former Cigarette smoker, quit 3 yrs ago  Family hx stroke (mother at age 65)  Migraines  PVD  s/p toe amputation (R 1st toe) - was on Alvarez 81 and Xarelto 2.5 daily PTA  Other Active Problems  COPD  Leukocytosis 12.7->10.7->10.4 (afebrile). CXR ok.; UA neg ; Rt foot x-ray - No acute osseous findings  Elevated troponin 34->41  Hypokalemia K 3.2 - supplement - 3.5->4.0  12 beats NSVT - asx - ECG ST rate 118 - RBBB (not new) - repeat troponin level 41  R ankle wound present on admission, no staging  DISCHARGE EXAM Blood pressure (!) 190/99, pulse (!) 107, temperature (!) 97.5 F (36.4 C), temperature source Oral, resp. rate 15, height 5\' 6"  (1.676 m), weight 70.1 kg, last menstrual period 04/05/2004, SpO2 100 %. General - Well nourished, well developed, middle-aged Caucasian lady Ophthalmologic - fundi not visualized due to noncooperation.  Cardiovascular - Regular rhythm and rate.  Neuro -drowsy but can be aroused t, follows simple commands.t moderate to severe dysarthia, follows commands on the right, hemineglect to the left, anosognosia. Eyes right gaze deviation, barely cross midline. Not blinking to visual threat on the left, left facial droop, left eye closing less strong than the right. Tongue midline. LUE no spontaneous movement, on pain extension posturing. LLE no spontaneous movement, slight withdraw to pain. RUE and RLE 5/5,  following commands. Left hemisensory neglect, able to react to pain stimulation. Left babinski positive. Sensation, coordination and gait not tested.  Discharge Diet   Comfort feeds   DISCHARGE PLAN  Disposition:  [facility]  35 minutes were spent preparing discharge.  Annie Main, MSN, APRN, ANVP-BC, AGPCNP-BC Advanced Practice Stroke Nurse [facility] See Amion for Schedule & Pager information 04/23/2019 11:34 AM   I have personally obtained history,examined this patient, reviewed notes, independently viewed imaging studies, participated in medical decision making and plan  of care.ROS completed by me personally and pertinent positives fully documented  I have made any additions or clarifications directly to the above note. Agree with note above.    Delia Heady, MD Medical Director [facility] Pager: [phone] 04/23/2019 2:59 PM

## 2019-04-23 NOTE — Plan of Care (Signed)
Patient discharged to SNF with hospice

## 2019-04-23 NOTE — Progress Notes (Signed)
[facility] Documentation   Pt has been approved for [facility] transfer and all paperwork has been completed. [facility] does have a bed available for pt today.   Please call [facility] at [phone] to give charge nurse report and fax discharge summary to [phone].   Please dc any lines. May leave catheter in place if pt has one. Please send pt to [facility] with DNR paperwork.    Please call with any questions.    Thank you,  Trena Platt, RN [facility] Liaison [phone]

## 2019-04-23 NOTE — TOC Transition Note (Signed)
Transition of Care [facility]) - CM/SW Discharge Note   Patient Details  Name: Shantika Bermea MRN: [id number] Date of Birth: [birth year]  Transition of Care [facility]) CM/SW Contact:  Baldemar Lenis, LCSW Phone Number: 04/23/2019, 12:05 PM   Clinical Narrative:   Nurse to call report to [phone].    Final next level of care: Hospice Medical Facility Barriers to Discharge: Barriers Resolved   Patient Goals and CMS Choice        Discharge Placement              Patient [facility] bed at: [facility] Patient to be transferred to facility by: [facility] Name of family member notified: Sister Patient and family notified of of transfer: 04/23/19  Discharge Plan and Services                                     Social Determinants of Health (SDOH) Interventions     Readmission Risk Interventions No flowsheet data found.

## 2019-05-07 DEATH — deceased

## 2021-10-16 IMAGING — DX DG HUMERUS 2V *L*
2 series · 2 of 2 positions shown · non-contrast
Comparison: None.

CLINICAL DATA: Fall 3 days ago with left shoulder bruising.

EXAM:
LEFT HUMERUS - 2+ VIEW

[humerus ap]
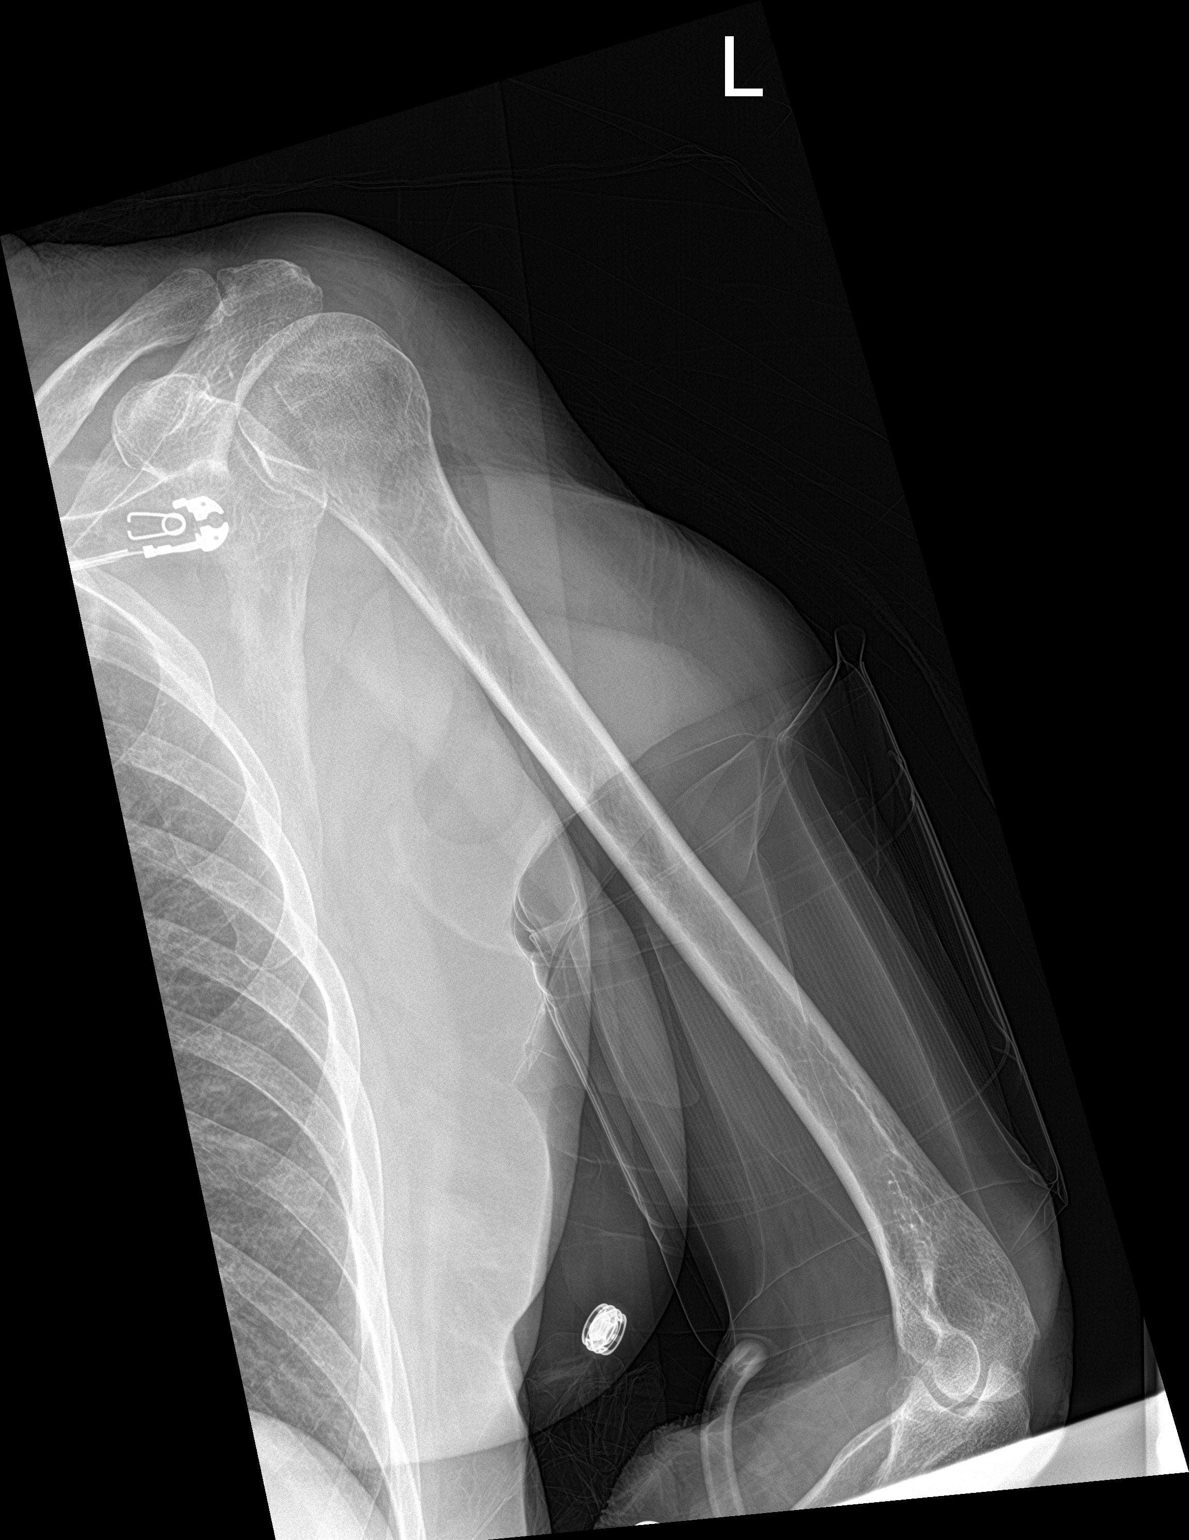

[humerus lat]
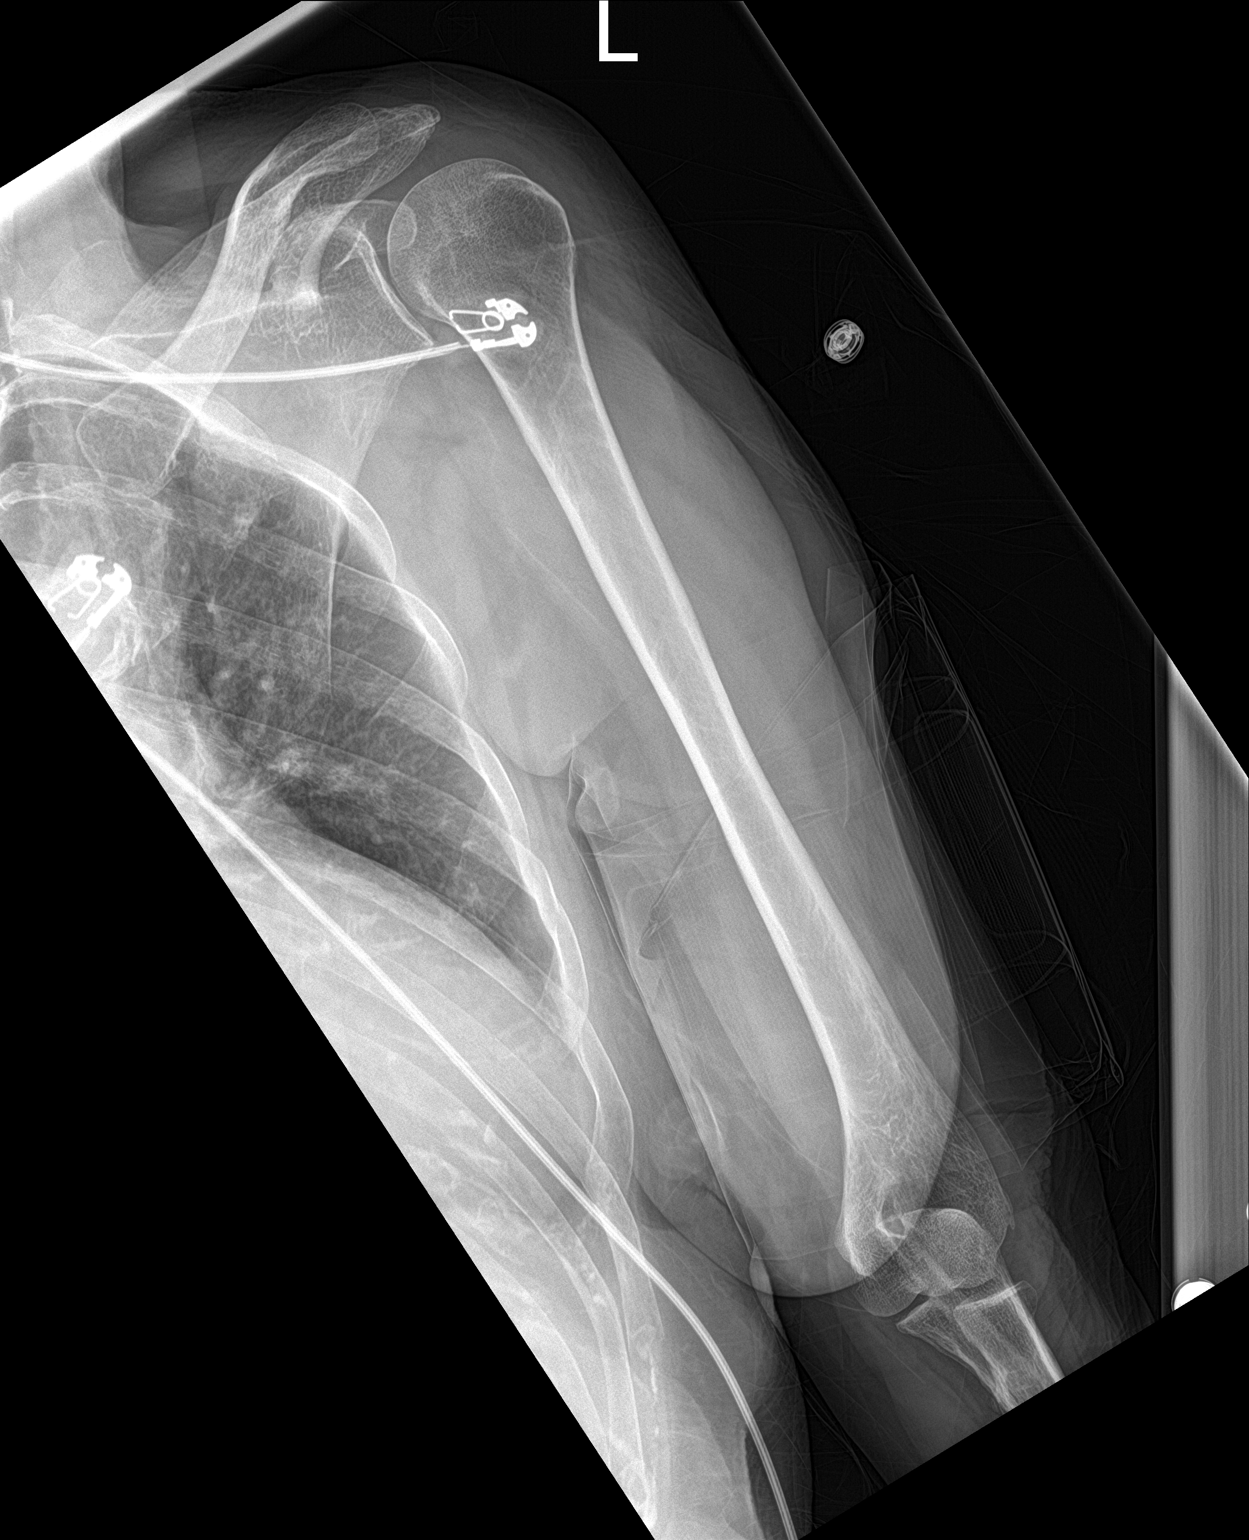

[2 of 2 positions shown; findings below may reference images not displayed]

FINDINGS: There is no evidence of acute fracture or malalignment. Lateral left
fifth rib fracture which has a chronic and healed appearance.
IMPRESSION: Negative.

## 2021-10-16 IMAGING — CT CT ANGIO HEAD
3 of 7 series · 10 of 36 positions shown · IV contrast (APPLIED)
Comparison: Prior CT from 04/17/2019.

CLINICAL DATA: Follow-up examination for acute stroke.

EXAM:
CT ANGIOGRAPHY HEAD AND NECK
TECHNIQUE: Multidetector CT imaging of the head and neck was performed using
the standard protocol during bolus administration of intravenous
contrast. Multiplanar CT image reconstructions and MIPs were
obtained to evaluate the vascular anatomy. Carotid stenosis
measurements (when applicable) are obtained utilizing NASCET
criteria, using the distal internal carotid diameter as the
denominator.
CONTRAST:  75mL OMNIPAQUE IOHEXOL 350 MG/ML SOLN

[Series 5: cta neck/head · axial · 0.52mm/px · z∈[+962,+1078]mm · 2 of 176 slices shown]
[im 59/176  soft-tissue]
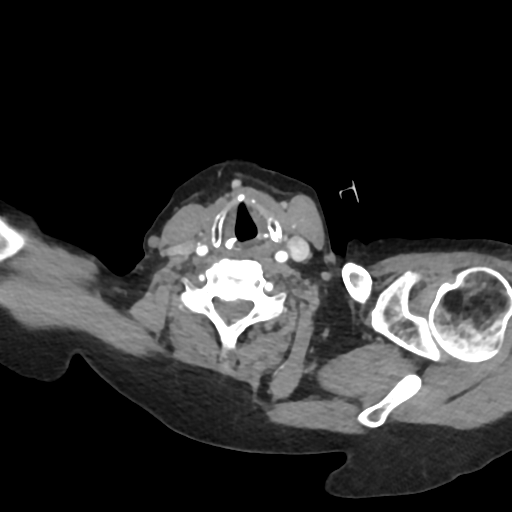
[im 117/176  soft-tissue]
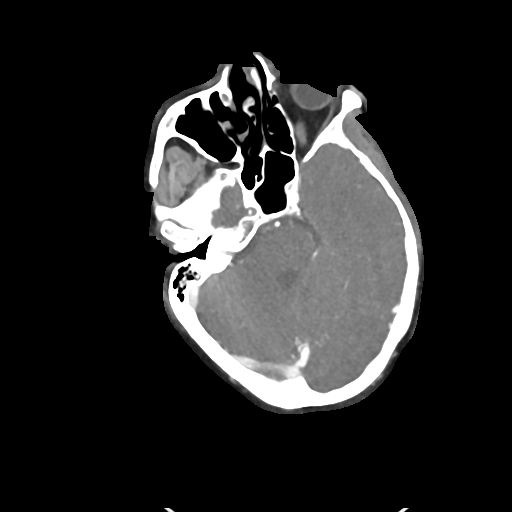

[Series 7: ax thins · axial · 0.37mm/px · z∈[+905,+1153]mm · 6 of 349 slices shown]
[im 50/349  soft-tissue]
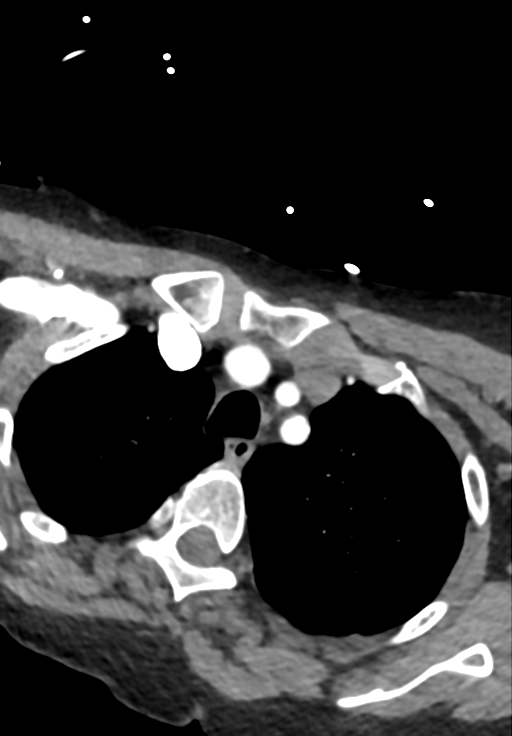
[im 100/349  bone]
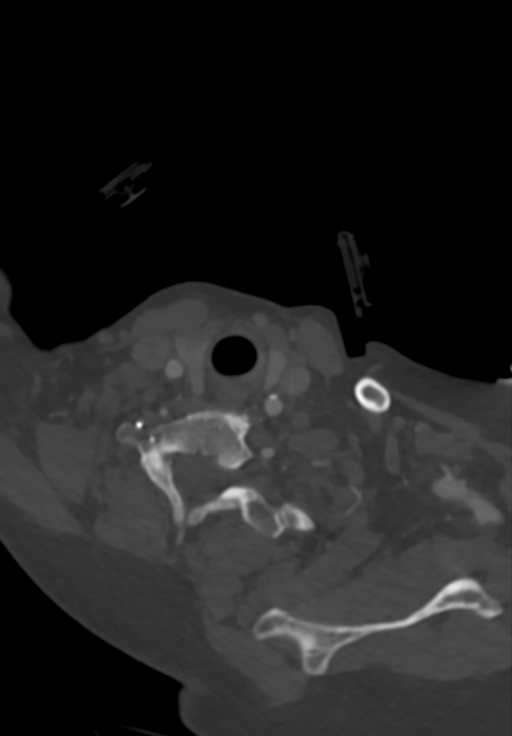
[im 150/349  soft-tissue]
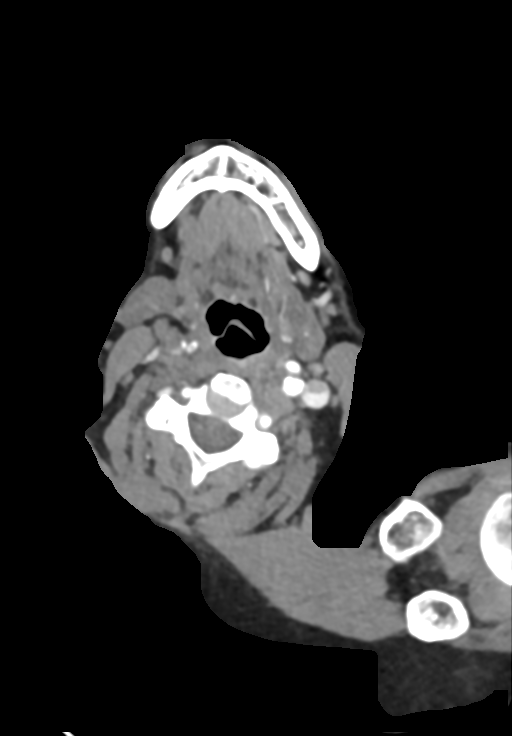
[im 199/349  bone]
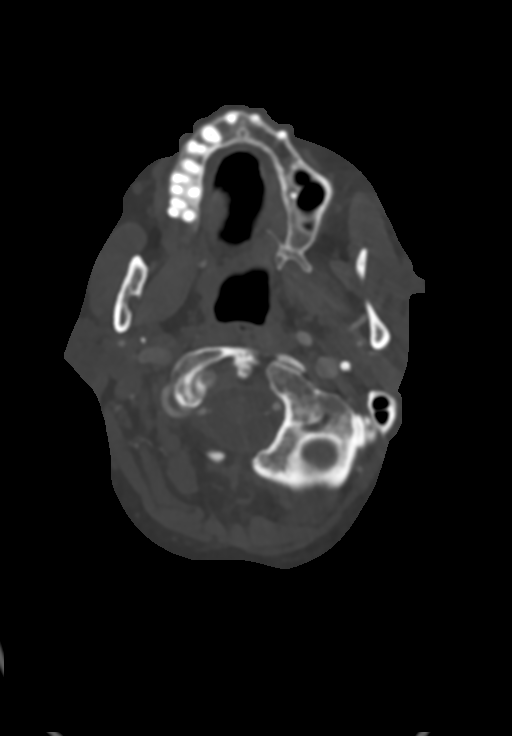
[im 249/349  soft-tissue]
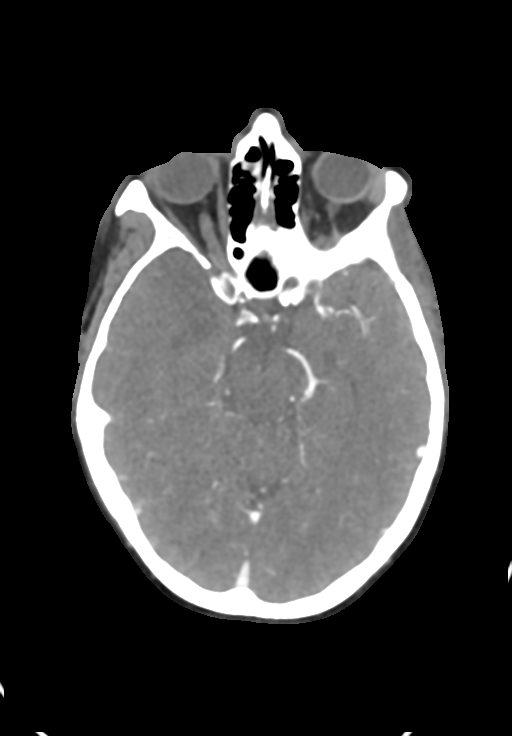
[im 299/349  bone]
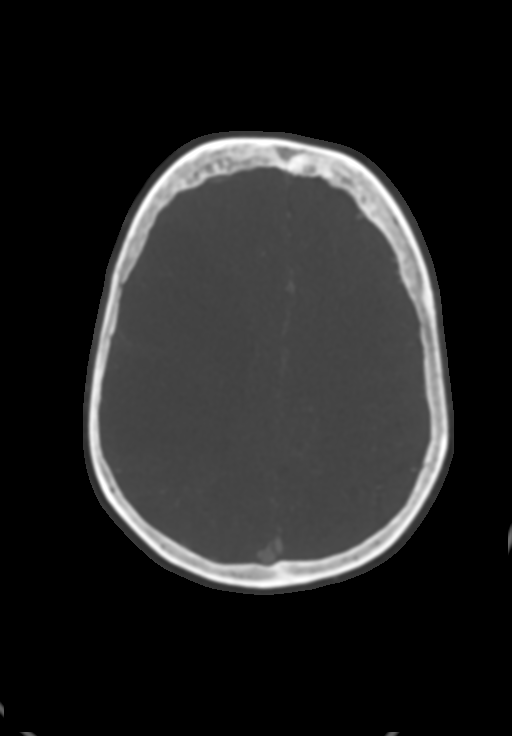

[Series 9: sag thins · sagittal · 0.55mm/px · 2 of 201 slices shown]
[im 59/201  soft-tissue]
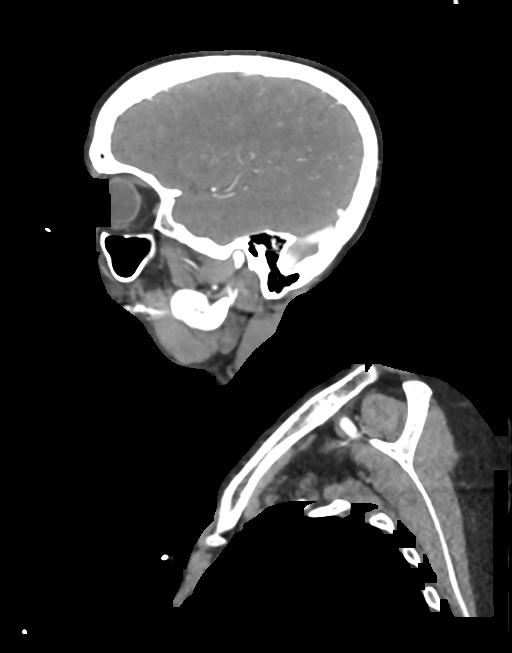
[im 143/201  soft-tissue]
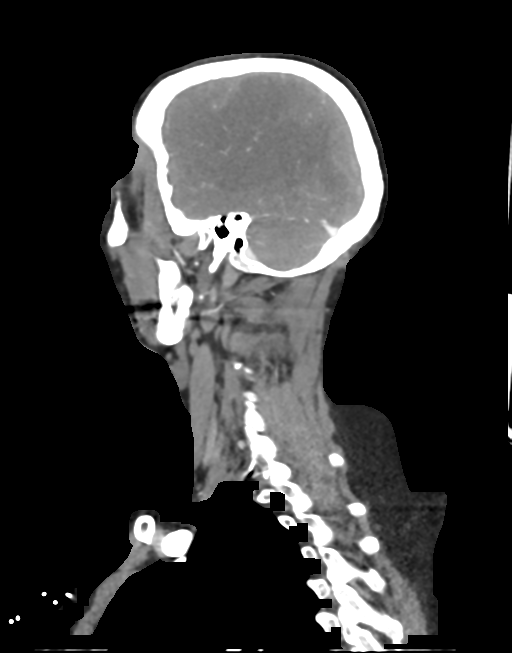

[10 of 36 positions shown; findings below may reference images not displayed]

FINDINGS: CTA NECK FINDINGS

Aortic arch: Visualized aortic arch of normal caliber with normal 3
vessel morphology. Moderate atherosclerotic change about the arch
and origin of the great vessels without hemodynamically significant
stenosis. Visualized subclavian arteries widely patent.

Right carotid system: Mild scattered atheromatous irregularity
within the right CCA without hemodynamically significant stenosis.
Possible small penetrating plaque noted at the distal right CCA
(series 7, image 226). Mixed atheromatous disease noted about the
right carotid bifurcation. Right ICA occluded at the bifurcation,
and remains occluded within the neck. Origin of the right external
carotid artery appears occluded as well, but is patent distally,
suspected to be through retrograde filling.

Left carotid system: Left CCA patent from its origin to the
bifurcation without stenosis. Mild atheromatous irregularity about
the left bifurcation without hemodynamically significant stenosis.
Left ICA patent distally to the skull base without stenosis,
dissection or occlusion.

Vertebral arteries: Both vertebral arteries arise from the
subclavian arteries. Dominant left vertebral artery widely patent
within the neck. Severe atheromatous stenosis of approximately 80%
seen at the origin of the right vertebral artery (series 7, image
275). Right vertebral otherwise widely patent within the neck as
well.

Skeleton: No acute osseous abnormality. No discrete osseous lesions.
Moderate cervical spondylosis noted at C4-5 through C6-7.

Other neck: No other acute soft tissue abnormality within the neck.
No mass lesion or adenopathy.

Upper chest: Visualized upper chest demonstrates no acute finding.
Mild centrilobular emphysema.

Review of the MIP images confirms the above findings

CTA HEAD FINDINGS

Anterior circulation: Petrous left ICA widely patent. Mild scattered
calcified plaque within the cavernous/supraclinoid left ICA without
hemodynamically significant stenosis. Left ICA terminus well
perfused. Left A1 patent. Normal in patent anterior communicating
artery. Both A2 segments well perfused and patent to their distal
aspects. Filling of the right A1 to the terminus via collateral
flow. Scant attenuated opacification of the right M1 segment and
distal MCA branches, likely collateral flow, either across the
circle-of-Willis or retrograde in nature.

Left M1 widely patent. Normal left MCA bifurcation. Distal left MCA
branches well perfused.

Posterior circulation: Focal atheromatous plaque at the left V4
segment as it crosses the dural reflection with short-segment
moderate stenosis. Vertebral arteries otherwise widely patent to the
vertebrobasilar junction. Posterior inferior cerebral arteries
patent bilaterally. Basilar widely patent to its distal aspect
without stenosis. Superior cerebral arteries patent bilaterally.
Both PCAs well perfused to their distal aspects without stenosis.
Small left posterior communicating artery noted.

Venous sinuses: Patent.

Anatomic variants: None significant.

Review of the MIP images confirms the above findings
IMPRESSION: 1. Occlusion of the right ICA at the bifurcation which remains
occluded to the terminus. Scant attenuated flow within the right MCA
via collateral flow across the circle-of-Willis and/or retrograde
filling.
2. Mild atheromatous irregularity about the left carotid bifurcation
and left carotid siphon without hemodynamically significant
stenosis.
3. Severe 80% atheromatous stenosis at the origin of the right
vertebral artery. Short-segment 50% stenosis at the proximal left V4
segment. Posterior circulation otherwise widely patent.
4. Large evolving subacute right MCA territory infarct with
associated mass effect and midline shift, stable from prior CT.
5.  Emphysema ([id number]).
